# Patient Record
Sex: Female | Born: 1970 | Race: White | Hispanic: No | Smoking: Former smoker
Health system: Southern US, Community
[De-identification: ages and names within clinical notes are randomized; demographics above are authoritative.]

## PROBLEM LIST (undated history)

## (undated) DIAGNOSIS — K219 Gastro-esophageal reflux disease without esophagitis: Secondary | ICD-10-CM

## (undated) DIAGNOSIS — R079 Chest pain, unspecified: Secondary | ICD-10-CM

## (undated) DIAGNOSIS — I10 Essential (primary) hypertension: Secondary | ICD-10-CM

## (undated) HISTORY — DX: Essential (primary) hypertension: I10

## (undated) HISTORY — DX: Chest pain, unspecified: R07.9

## (undated) HISTORY — DX: Gastro-esophageal reflux disease without esophagitis: K21.9

---

## 2011-01-02 ENCOUNTER — Emergency Department (HOSPITAL_COMMUNITY): Payer: Medicaid Other

## 2011-01-02 ENCOUNTER — Emergency Department (HOSPITAL_COMMUNITY)
Admission: EM | Admit: 2011-01-02 | Discharge: 2011-01-02 | Disposition: A | Discharge status: Home / Self Care | Payer: Medicaid Other | Attending: Emergency Medicine | Admitting: Emergency Medicine

## 2011-01-02 DIAGNOSIS — K219 Gastro-esophageal reflux disease without esophagitis: Secondary | ICD-10-CM | POA: Insufficient documentation

## 2011-01-02 DIAGNOSIS — Z79899 Other long term (current) drug therapy: Secondary | ICD-10-CM | POA: Insufficient documentation

## 2011-01-02 DIAGNOSIS — R05 Cough: Secondary | ICD-10-CM | POA: Insufficient documentation

## 2011-01-02 DIAGNOSIS — R059 Cough, unspecified: Secondary | ICD-10-CM | POA: Insufficient documentation

## 2011-01-02 DIAGNOSIS — E119 Type 2 diabetes mellitus without complications: Secondary | ICD-10-CM | POA: Insufficient documentation

## 2011-01-02 DIAGNOSIS — J029 Acute pharyngitis, unspecified: Secondary | ICD-10-CM | POA: Insufficient documentation

## 2011-01-02 DIAGNOSIS — J40 Bronchitis, not specified as acute or chronic: Secondary | ICD-10-CM | POA: Insufficient documentation

## 2011-01-02 LAB — RAPID STREP SCREEN (MED CTR MEBANE ONLY): Streptococcus, Group A Screen (Direct): NEGATIVE

## 2011-05-13 ENCOUNTER — Emergency Department (HOSPITAL_COMMUNITY)
Admission: EM | Admit: 2011-05-13 | Discharge: 2011-05-13 | Disposition: A | Discharge status: Home / Self Care | Payer: Medicaid Other | Attending: Emergency Medicine | Admitting: Emergency Medicine

## 2011-05-13 ENCOUNTER — Encounter: Payer: Self-pay | Admitting: Emergency Medicine

## 2011-05-13 ENCOUNTER — Emergency Department (HOSPITAL_COMMUNITY): Payer: Medicaid Other

## 2011-05-13 DIAGNOSIS — Y92009 Unspecified place in unspecified non-institutional (private) residence as the place of occurrence of the external cause: Secondary | ICD-10-CM | POA: Insufficient documentation

## 2011-05-13 DIAGNOSIS — S82409A Unspecified fracture of shaft of unspecified fibula, initial encounter for closed fracture: Secondary | ICD-10-CM | POA: Insufficient documentation

## 2011-05-13 DIAGNOSIS — F17200 Nicotine dependence, unspecified, uncomplicated: Secondary | ICD-10-CM | POA: Insufficient documentation

## 2011-05-13 DIAGNOSIS — E119 Type 2 diabetes mellitus without complications: Secondary | ICD-10-CM | POA: Insufficient documentation

## 2011-05-13 MED ORDER — OXYCODONE-ACETAMINOPHEN 5-325 MG PO TABS
1.0000 | ORAL_TABLET | Freq: Once | ORAL | Status: AC
  Administered 2011-05-13: 1 via ORAL
  Filled 2011-05-13: qty 1

## 2011-05-13 MED ORDER — KETOROLAC TROMETHAMINE 60 MG/2ML IM SOLN
60.0000 mg | Freq: Once | INTRAMUSCULAR | Status: AC
  Administered 2011-05-13: 60 mg via INTRAMUSCULAR
  Filled 2011-05-13: qty 2

## 2011-05-13 MED ORDER — OXYCODONE-ACETAMINOPHEN 5-325 MG PO TABS: 1.0000 | ORAL_TABLET | ORAL | Status: AC | PRN
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
## 2011-05-16 NOTE — ED Provider Notes (Signed)
Medical screening examination/treatment/procedure(s) were performed by non-physician practitioner and as supervising physician I was immediately available for consultation/collaboration.   Daianna Vasques L Jakera Beaupre, MD 05/16/11 0735 

## 2011-05-19 ENCOUNTER — Ambulatory Visit: Payer: Medicaid Other | Admitting: Orthopedic Surgery

## 2011-05-20 ENCOUNTER — Ambulatory Visit (INDEPENDENT_AMBULATORY_CARE_PROVIDER_SITE_OTHER): Payer: Medicaid Other | Admitting: Orthopedic Surgery

## 2011-05-20 ENCOUNTER — Encounter: Payer: Self-pay | Admitting: Orthopedic Surgery

## 2011-05-20 VITALS — BP 110/80 | Ht 62.0 in | Wt 195.0 lb

## 2011-05-20 DIAGNOSIS — S8263XA Displaced fracture of lateral malleolus of unspecified fibula, initial encounter for closed fracture: Secondary | ICD-10-CM | POA: Insufficient documentation

## 2011-05-20 MED ORDER — HYDROCODONE-ACETAMINOPHEN 7.5-325 MG PO TABS: 1.0000 | ORAL_TABLET | ORAL | Status: AC | PRN

## 2011-05-20 NOTE — Patient Instructions (Signed)
Walk on it as tolerated with boot and crutches

## 2011-05-21 ENCOUNTER — Ambulatory Visit: Payer: Medicaid Other | Admitting: Orthopedic Surgery

## 2011-05-26 ENCOUNTER — Encounter: Payer: Self-pay | Admitting: Orthopedic Surgery
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
## 2011-06-24 ENCOUNTER — Ambulatory Visit (INDEPENDENT_AMBULATORY_CARE_PROVIDER_SITE_OTHER): Payer: Medicaid Other | Admitting: Orthopedic Surgery

## 2011-06-24 ENCOUNTER — Ambulatory Visit: Payer: Medicaid Other | Admitting: Orthopedic Surgery

## 2011-06-24 ENCOUNTER — Encounter: Payer: Self-pay | Admitting: Orthopedic Surgery

## 2011-06-24 VITALS — BP 104/60 | Ht 62.0 in | Wt 195.0 lb

## 2011-06-24 DIAGNOSIS — S82899A Other fracture of unspecified lower leg, initial encounter for closed fracture: Secondary | ICD-10-CM

## 2011-06-24 MED ORDER — HYDROCODONE-ACETAMINOPHEN 7.5-325 MG PO TABS: 1.0000 | ORAL_TABLET | ORAL | Status: DC | PRN

## 2011-06-24 NOTE — Patient Instructions (Signed)
Do Exercises   Wear Boot   Take medication

## 2011-06-25 ENCOUNTER — Encounter: Payer: Self-pay | Admitting: Orthopedic Surgery
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
## 2011-08-05 ENCOUNTER — Ambulatory Visit: Payer: Medicaid Other | Admitting: Orthopedic Surgery

## 2011-08-07 ENCOUNTER — Ambulatory Visit: Payer: Medicaid Other | Admitting: Orthopedic Surgery

## 2011-08-18 ENCOUNTER — Ambulatory Visit (INDEPENDENT_AMBULATORY_CARE_PROVIDER_SITE_OTHER): Payer: Medicaid Other | Admitting: Orthopedic Surgery

## 2011-08-18 ENCOUNTER — Encounter: Payer: Self-pay | Admitting: Orthopedic Surgery

## 2011-08-18 ENCOUNTER — Ambulatory Visit: Payer: Medicaid Other | Admitting: Orthopedic Surgery

## 2011-08-18 VITALS — BP 130/80 | Ht 62.0 in | Wt 211.0 lb

## 2011-08-18 DIAGNOSIS — S82899A Other fracture of unspecified lower leg, initial encounter for closed fracture: Secondary | ICD-10-CM | POA: Insufficient documentation

## 2011-08-18 MED ORDER — HYDROCODONE-ACETAMINOPHEN 7.5-325 MG PO TABS: 1.0000 | ORAL_TABLET | ORAL | Status: DC | PRN

## 2011-08-18 MED ORDER — HYDROCODONE-ACETAMINOPHEN 7.5-325 MG PO TABS: 1.0000 | ORAL_TABLET | ORAL | Status: AC | PRN

## 2011-08-18 NOTE — Progress Notes (Signed)
Patient ID: Kayla Wilkinson, female   DOB: 04-27-1971, 41 y.o.   MRN: [id number]  This is a fracture care followup visit  DOI:   Diagnoses A lateral malleolus fracture LEFT ankle  Treatment Immobilization was Cam Walker  Current medications Hydrocodone 7.5 mg  Today we will be taking x-rays.  Complains of pain along the fibula.  BP 130/80  Ht 5\' 2"  (1.575 m)  Wt 211 lb (95.709 kg)  BMI 38.59 kg/m2   Clinical exam shows no tenderness some restriction in her range of motion at the ankle joint.  I did show her how to do some more exercises using large circles frontward and backwards continued ankle flexion-extension exercises  X-ray show fracture healing  We returned to work  Pain medicine were prescribed and refilled followup as needed  X-ray report Fracture care followup 3 views LEFT ankle  Fracture is only seen on the lateral view it is nondisplaced overall it has improved since initial injury.  Ankle mortise intact.  Impression healed LEFT ankle fracture

## 2011-08-18 NOTE — Patient Instructions (Addendum)
Work note:  may return to employment   May remove brace

## 2011-08-21 ENCOUNTER — Emergency Department (HOSPITAL_COMMUNITY)
Admission: EM | Admit: 2011-08-21 | Discharge: 2011-08-21 | Disposition: A | Discharge status: Home / Self Care | Payer: Medicaid Other | Attending: Emergency Medicine | Admitting: Emergency Medicine

## 2011-08-21 ENCOUNTER — Encounter (HOSPITAL_COMMUNITY): Payer: Self-pay | Admitting: *Deleted

## 2011-08-21 DIAGNOSIS — E119 Type 2 diabetes mellitus without complications: Secondary | ICD-10-CM | POA: Insufficient documentation

## 2011-08-21 DIAGNOSIS — H109 Unspecified conjunctivitis: Secondary | ICD-10-CM

## 2011-08-21 DIAGNOSIS — H5710 Ocular pain, unspecified eye: Secondary | ICD-10-CM | POA: Insufficient documentation

## 2011-08-21 MED ORDER — HYDROCODONE-ACETAMINOPHEN 5-325 MG PO TABS
2.0000 | ORAL_TABLET | Freq: Once | ORAL | Status: DC
  Filled 2011-08-21: qty 2

## 2011-08-21 MED ORDER — ONDANSETRON 4 MG PO TBDP
4.0000 mg | ORAL_TABLET | Freq: Once | ORAL | Status: DC
  Filled 2011-08-21: qty 1

## 2011-08-21 MED ORDER — HYDROCODONE-ACETAMINOPHEN 5-325 MG PO TABS: ORAL_TABLET | ORAL | Status: DC

## 2011-08-21 MED ORDER — TOBRAMYCIN 0.3 % OP SOLN
2.0000 [drp] | OPHTHALMIC | Status: DC
  Filled 2011-08-21: qty 5

## 2011-08-21 NOTE — Discharge Instructions (Signed)
Please wash hands frequently. Cool compress to the left eye 3 to 4 times daily. Tobramycin to the left eye every 4 hours. Norco for pain if needed. This may cause drowsiness, use with caution.

## 2011-08-21 NOTE — ED Notes (Signed)
Pain lt eye with redness, and d/c

## 2011-08-21 NOTE — ED Notes (Signed)
Pt DC to home in the care of family 

## 2011-08-21 NOTE — ED Notes (Signed)
MD at bedside. 

## 2011-08-21 NOTE — ED Provider Notes (Signed)
History     CSN: [id number]  Arrival date & time 08/21/11  2021   First MD Initiated Contact with Patient 08/21/11 2117      Chief Complaint  Patient presents with  . Eye Pain    (Consider location/radiation/quality/duration/timing/severity/associated sxs/prior treatment) HPI Comments: The patient states she was awakened this morning to find she had exudate on the upper and lower lids of the left eye. She later noted increased redness of the eyes during the day. This was accompanied by problems with pain when she changed various intensities of light. Upon arrival to the emergency department she was also dealing with a headache. She presents now for evaluation of her eye and for assistance with her headache. She denies any recent injury she denies being around any welding or grinding. She does say she's been having a mild to moderate scratchy throat and congestion.  Patient is a 41 y.o. female presenting with eye pain. The history is provided by the patient.  Eye Pain This is a new problem. The current episode started today. The problem occurs constantly. The problem has been unchanged. Associated symptoms include a sore throat. Pertinent negatives include no abdominal pain, arthralgias, chest pain, coughing or neck pain. Exacerbated by: light changes. She has tried nothing for the symptoms. The treatment provided no relief.    Past Medical History  Diagnosis Date  . Diabetes mellitus     History reviewed. No pertinent past surgical history.  Family History  Problem Relation Age of Onset  . Diabetes Mother     History  Substance Use Topics  . Smoking status: Former Smoker -- 0.2 packs/day for 1.5 years    Types: Cigarettes  . Smokeless tobacco: Never Used  . Alcohol Use: Yes     occassional    OB History    Grav Para Term Preterm Abortions TAB SAB Ect Mult Living   3 2 2  1  1          Review of Systems  Constitutional: Negative for activity change.       All ROS Neg  except as noted in HPI  HENT: Positive for sore throat. Negative for nosebleeds and neck pain.   Eyes: Positive for pain. Negative for photophobia and discharge.  Respiratory: Negative for cough, shortness of breath and wheezing.   Cardiovascular: Negative for chest pain and palpitations.  Gastrointestinal: Negative for abdominal pain and blood in stool.  Genitourinary: Negative for dysuria, frequency and hematuria.  Musculoskeletal: Negative for back pain and arthralgias.  Skin: Negative.   Neurological: Negative for dizziness, seizures and speech difficulty.  Psychiatric/Behavioral: Negative for hallucinations and confusion.    Allergies  Review of patient's allergies indicates no known allergies.  Home Medications   Current Outpatient Rx  Name Route Sig Dispense Refill  . METFORMIN HCL 500 MG PO TABS Oral Take 500 mg by mouth 2 (two) times daily.    Marland Kitchen RANITIDINE HCL 300 MG PO CAPS Oral Take 300 mg by mouth 2 (two) times daily.      Marland Kitchen HYDROCODONE-ACETAMINOPHEN 5-325 MG PO TABS  1 or 2 po q4h prn pain 15 tablet 0  . HYDROCODONE-ACETAMINOPHEN 7.5-325 MG PO TABS Oral Take 1 tablet by mouth every 4 (four) hours as needed for pain. 60 tablet 5    BP 126/71  Pulse 115  Temp(Src) 97.8 F (36.6 C) (Oral)  Resp 20  Ht 5\' 2"  (1.575 m)  Wt 211 lb (95.709 kg)  BMI 38.59 kg/m2  SpO2 99%  LMP 08/14/2011  Physical Exam  Nursing note and vitals reviewed. Constitutional: She is oriented to person, place, and time. She appears well-developed and well-nourished.  Non-toxic appearance.  HENT:  Head: Normocephalic.  Right Ear: Tympanic membrane and external ear normal.  Left Ear: Tympanic membrane and external ear normal.  Eyes: EOM and lids are normal. Pupils are equal, round, and reactive to light.       The left conjunctiva is injected. The extraocular movement is intact. The anterior chambers clear. The lids were inverted and no foreign body appreciated.  Neck: Normal range of motion.  Neck supple. Carotid bruit is not present.  Cardiovascular: Normal rate, regular rhythm, normal heart sounds, intact distal pulses and normal pulses.   Pulmonary/Chest: Breath sounds normal. No respiratory distress.  Abdominal: Soft. Bowel sounds are normal. There is no tenderness. There is no guarding.  Musculoskeletal: Normal range of motion.  Lymphadenopathy:       Head (right side): No submandibular adenopathy present.       Head (left side): No submandibular adenopathy present.    She has no cervical adenopathy.  Neurological: She is alert and oriented to person, place, and time. She has normal strength. No cranial nerve deficit or sensory deficit.  Skin: Skin is warm and dry.  Psychiatric: She has a normal mood and affect. Her speech is normal.    ED Course  Procedures (including critical care time)  Labs Reviewed - No data to display No results found.   1. Conjunctivitis       MDM  I have reviewed nursing notes, vital signs, and all appropriate lab and imaging results for this patient.  Patient is to use tobramycin eyedrops every 4 hours to the left eye. Wash hands frequently. She is to return to the emergency department or see the ophthalmologist for additional evaluation if not improving. Prescription for Norco 5 mg one or 2 tablets every 4 hours #15 given to the patient.      Kathie Dike, [place] 08/22/11 [phone]

## 2011-08-23 NOTE — ED Provider Notes (Signed)
Medical screening examination/treatment/procedure(s) were performed by non-physician practitioner and as supervising physician I was immediately available for consultation/collaboration.  Nicoletta Dress. Colon Branch, MD 08/23/11 [phone]

## 2011-09-02 ENCOUNTER — Telehealth: Payer: Self-pay | Admitting: Orthopedic Surgery

## 2011-09-02 NOTE — Telephone Encounter (Signed)
[facility] also just left a message asking for you to call Diane at the pharmacy about this same patient Her # [phone]

## 2011-09-02 NOTE — Telephone Encounter (Signed)
PATIENT CALLED BACK TO RELAY THAT NO IS CALL NEEDED, SCRIPTS HAVE BEEN FILLED

## 2011-09-02 NOTE — Telephone Encounter (Signed)
Patient asked for the nurse to call her, says she is at [facility] in [place], and there is a mix-up about her getting a refill on her Hydrocodone.  Her # [phone]

## 2013-06-07 ENCOUNTER — Encounter: Payer: Self-pay | Admitting: Orthopedic Surgery

## 2013-06-07 ENCOUNTER — Ambulatory Visit (INDEPENDENT_AMBULATORY_CARE_PROVIDER_SITE_OTHER): Payer: 59 | Admitting: Orthopedic Surgery

## 2013-06-07 VITALS — BP 122/90 | Ht 62.5 in | Wt 202.0 lb

## 2013-06-07 DIAGNOSIS — M67919 Unspecified disorder of synovium and tendon, unspecified shoulder: Secondary | ICD-10-CM

## 2013-06-07 DIAGNOSIS — M7550 Bursitis of unspecified shoulder: Secondary | ICD-10-CM | POA: Insufficient documentation

## 2013-06-07 DIAGNOSIS — M7552 Bursitis of left shoulder: Secondary | ICD-10-CM

## 2013-06-07 MED ORDER — HYDROCODONE-ACETAMINOPHEN 5-325 MG PO TABS: 1.0000 | ORAL_TABLET | Freq: Four times a day (QID) | ORAL | Status: DC | PRN

## 2013-06-07 NOTE — Patient Instructions (Signed)
Continue Aleve and pain medicine as needed

## 2013-06-07 NOTE — Progress Notes (Signed)
Subacromial Shoulder Injection Procedure Note  Pre-operative Diagnosis: left RC Syndrome  Post-operative Diagnosis: same  Indications: pain   Anesthesia: ethyl chloride   Procedure Details   Verbal consent was obtained for the procedure. The shoulder was prepped withalcohol and the skin was anesthetized. A 20 gauge needle was advanced into the subacromial space through posterior approach without difficulty  The space was then injected with 3 ml 1% lidocaine and 1 ml of depomedrol. The injection site was cleansed with isopropyl alcohol and a dressing was applied.  Complications:  None; patient tolerated the procedure well.
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
## 2013-06-14 ENCOUNTER — Ambulatory Visit (INDEPENDENT_AMBULATORY_CARE_PROVIDER_SITE_OTHER): Payer: 59 | Admitting: Orthopedic Surgery

## 2013-06-14 ENCOUNTER — Encounter: Payer: Self-pay | Admitting: Orthopedic Surgery

## 2013-06-14 VITALS — BP 158/96 | Ht 62.5 in | Wt 202.0 lb

## 2013-06-14 DIAGNOSIS — M7552 Bursitis of left shoulder: Secondary | ICD-10-CM

## 2013-06-14 DIAGNOSIS — M67919 Unspecified disorder of synovium and tendon, unspecified shoulder: Secondary | ICD-10-CM

## 2013-06-14 MED ORDER — HYDROCODONE-ACETAMINOPHEN 5-325 MG PO TABS: 1.0000 | ORAL_TABLET | Freq: Four times a day (QID) | ORAL | Status: DC | PRN
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
## 2013-06-14 NOTE — Patient Instructions (Addendum)
Call to arrange therapy at [facility]  Impingement Syndrome, Rotator Cuff, Bursitis with Rehab Impingement syndrome is a condition that involves inflammation of the tendons of the rotator cuff and the subacromial bursa, that causes pain in the shoulder. The rotator cuff consists of four tendons and muscles that control much of the shoulder and upper arm function. The subacromial bursa is a fluid filled sac that helps reduce friction between the rotator cuff and one of the bones of the shoulder (acromion). Impingement syndrome is usually an overuse injury that causes swelling of the bursa (bursitis), swelling of the tendon (tendonitis), and/or a tear of the tendon (strain). Strains are classified into three categories. Grade 1 strains cause pain, but the tendon is not lengthened. Grade 2 strains include a lengthened ligament, due to the ligament being stretched or partially ruptured. With grade 2 strains there is still function, although the function may be decreased. Grade 3 strains include a complete tear of the tendon or muscle, and function is usually impaired. SYMPTOMS   Pain around the shoulder, often at the outer portion of the upper arm.  Pain that gets worse with shoulder function, especially when reaching overhead or lifting.  Sometimes, aching when not using the arm.  Pain that wakes you up at night.  Sometimes, tenderness, swelling, warmth, or redness over the affected area.  Loss of strength.  Limited motion of the shoulder, especially reaching behind the back (to the back pocket or to unhook bra) or across your body.  Crackling sound (crepitation) when moving the arm.  Biceps tendon pain and inflammation (in the front of the shoulder). Worse when bending the elbow or lifting. CAUSES  Impingement syndrome is often an overuse injury, in which chronic (repetitive) motions cause the tendons or bursa to become inflamed. A strain occurs when a force is paced on the tendon or muscle that is  greater than it can withstand. Common mechanisms of injury include: Stress from sudden increase in duration, frequency, or intensity of training.  Direct hit (trauma) to the shoulder.  Aging, erosion of the tendon with normal use.  Bony bump on shoulder (acromial spur). RISK INCREASES WITH:  Contact sports (football, wrestling, boxing).  Throwing sports (baseball, tennis, volleyball).  Weightlifting and bodybuilding.  Heavy labor.  Previous injury to the rotator cuff, including impingement.  Poor shoulder strength and flexibility.  Failure to warm up properly before activity.  Inadequate protective equipment.  Old age.  Bony bump on shoulder (acromial spur). PREVENTION   Warm up and stretch properly before activity.  Allow for adequate recovery between workouts.  Maintain physical fitness:  Strength, flexibility, and endurance.  Cardiovascular fitness.  Learn and use proper exercise technique. PROGNOSIS  If treated properly, impingement syndrome usually goes away within 6 weeks. Sometimes surgery is required.  RELATED COMPLICATIONS   Longer healing time if not properly treated, or if not given enough time to heal.  Recurring symptoms, that result in a chronic condition.  Shoulder stiffness, frozen shoulder, or loss of motion.  Rotator cuff tendon tear.  Recurring symptoms, especially if activity is resumed too soon, with overuse, with a direct blow, or when using poor technique. TREATMENT  Treatment first involves the use of ice and medicine, to reduce pain and inflammation. The use of strengthening and stretching exercises may help reduce pain with activity. These exercises may be performed at home or with a therapist. If non-surgical treatment is unsuccessful after more than 6 months, surgery may be advised. After surgery and rehabilitation,  activity is usually possible in 3 months.  MEDICATION  If pain medicine is needed, nonsteroidal anti-inflammatory  medicines (aspirin and ibuprofen), or other minor pain relievers (acetaminophen), are often advised.  Do not take pain medicine for 7 days before surgery.  Prescription pain relievers may be given, if your caregiver thinks they are needed. Use only as directed and only as much as you need.  Corticosteroid injections may be given by your caregiver. These injections should be reserved for the most serious cases, because they may only be given a certain number of times. HEAT AND COLD  Cold treatment (icing) should be applied for 10 to 15 minutes every 2 to 3 hours for inflammation and pain, and immediately after activity that aggravates your symptoms. Use ice packs or an ice massage.  Heat treatment may be used before performing stretching and strengthening activities prescribed by your caregiver, physical therapist, or athletic trainer. Use a heat pack or a warm water soak. SEEK MEDICAL CARE IF:   Symptoms get worse or do not improve in 4 to 6 weeks, despite treatment.  New, unexplained symptoms develop. (Drugs used in treatment may produce side effects.) EXERCISES  RANGE OF MOTION (ROM) AND STRETCHING EXERCISES - Impingement Syndrome (Rotator Cuff  Tendinitis, Bursitis) These exercises may help you when beginning to rehabilitate your injury. Your symptoms may go away with or without further involvement from your physician, physical therapist or athletic trainer. While completing these exercises, remember:   Restoring tissue flexibility helps normal motion to return to the joints. This allows healthier, less painful movement and activity.  An effective stretch should be held for at least 30 seconds.  A stretch should never be painful. You should only feel a gentle lengthening or release in the stretched tissue. STRETCH  Flexion, Standing  Stand with good posture. With an underhand grip on your right / left hand, and an overhand grip on the opposite hand, grasp a broomstick or cane so that  your hands are a little more than shoulder width apart.  Keeping your right / left elbow straight and shoulder muscles relaxed, push the stick with your opposite hand, to raise your right / left arm in front of your body and then overhead. Raise your arm until you feel a stretch in your right / left shoulder, but before you have increased shoulder pain.  Try to avoid shrugging your right / left shoulder as your arm rises, by keeping your shoulder blade tucked down and toward your mid-back spine. Hold for __________ seconds.  Slowly return to the starting position. Repeat __________ times. Complete this exercise __________ times per day. STRETCH  Abduction, Supine  Lie on your back. With an underhand grip on your right / left hand and an overhand grip on the opposite hand, grasp a broomstick or cane so that your hands are a little more than shoulder width apart.  Keeping your right / left elbow straight and your shoulder muscles relaxed, push the stick with your opposite hand, to raise your right / left arm out to the side of your body and then overhead. Raise your arm until you feel a stretch in your right / left shoulder, but before you have increased shoulder pain.  Try to avoid shrugging your right / left shoulder as your arm rises, by keeping your shoulder blade tucked down and toward your mid-back spine. Hold for __________ seconds.  Slowly return to the starting position. Repeat __________ times. Complete this exercise __________ times per day. ROM  Flexion, Active-Assisted  Lie on your back. You may bend your knees for comfort.  Grasp a broomstick or cane so your hands are about shoulder width apart. Your right / left hand should grip the end of the stick, so that your hand is positioned "thumbs-up," as if you were about to shake hands.  Using your healthy arm to lead, raise your right / left arm overhead, until you feel a gentle stretch in your shoulder. Hold for __________  seconds.  Use the stick to assist in returning your right / left arm to its starting position. Repeat __________ times. Complete this exercise __________ times per day.  ROM - Internal Rotation, Supine   Lie on your back on a firm surface. Place your right / left elbow about 60 degrees away from your side. Elevate your elbow with a folded towel, so that the elbow and shoulder are the same height.  Using a broomstick or cane and your strong arm, pull your right / left hand toward your body until you feel a gentle stretch, but no increase in your shoulder pain. Keep your shoulder and elbow in place throughout the exercise.  Hold for __________ seconds. Slowly return to the starting position. Repeat __________ times. Complete this exercise __________ times per day. STRETCH - Internal Rotation  Place your right / left hand behind your back, palm up.  Throw a towel or belt over your opposite shoulder. Grasp the towel with your right / left hand.  While keeping an upright posture, gently pull up on the towel, until you feel a stretch in the front of your right / left shoulder.  Avoid shrugging your right / left shoulder as your arm rises, by keeping your shoulder blade tucked down and toward your mid-back spine.  Hold for __________ seconds. Release the stretch, by lowering your healthy hand. Repeat __________ times. Complete this exercise __________ times per day. ROM - Internal Rotation   Using an underhand grip, grasp a stick behind your back with both hands.  While standing upright with good posture, slide the stick up your back until you feel a mild stretch in the front of your shoulder.  Hold for __________ seconds. Slowly return to your starting position. Repeat __________ times. Complete this exercise __________ times per day.  STRETCH  Posterior Shoulder Capsule   Stand or sit with good posture. Grasp your right / left elbow and draw it across your chest, keeping it at the same  height as your shoulder.  Pull your elbow, so your upper arm comes in closer to your chest. Pull until you feel a gentle stretch in the back of your shoulder.  Hold for __________ seconds. Repeat __________ times. Complete this exercise __________ times per day. STRENGTHENING EXERCISES - Impingement Syndrome (Rotator Cuff Tendinitis, Bursitis) These exercises may help you when beginning to rehabilitate your injury. They may resolve your symptoms with or without further involvement from your physician, physical therapist or athletic trainer. While completing these exercises, remember:  Muscles can gain both the endurance and the strength needed for everyday activities through controlled exercises.  Complete these exercises as instructed by your physician, physical therapist or athletic trainer. Increase the resistance and repetitions only as guided.  You may experience muscle soreness or fatigue, but the pain or discomfort you are trying to eliminate should never worsen during these exercises. If this pain does get worse, stop and make sure you are following the directions exactly. If the pain is still present after adjustments,  discontinue the exercise until you can discuss the trouble with your clinician.  During your recovery, avoid activity or exercises which involve actions that place your injured hand or elbow above your head or behind your back or head. These positions stress the tissues which you are trying to heal. STRENGTH - Scapular Depression and Adduction   With good posture, sit on a firm chair. Support your arms in front of you, with pillows, arm rests, or on a table top. Have your elbows in line with the sides of your body.  Gently draw your shoulder blades down and toward your mid-back spine. Gradually increase the tension, without tensing the muscles along the top of your shoulders and the back of your neck.  Hold for __________ seconds. Slowly release the tension and relax  your muscles completely before starting the next repetition.  After you have practiced this exercise, remove the arm support and complete the exercise in standing as well as sitting position. Repeat __________ times. Complete this exercise __________ times per day.  STRENGTH - Shoulder Abductors, Isometric  With good posture, stand or sit about 4-6 inches from a wall, with your right / left side facing the wall.  Bend your right / left elbow. Gently press your right / left elbow into the wall. Increase the pressure gradually, until you are pressing as hard as you can, without shrugging your shoulder or increasing any shoulder discomfort.  Hold for __________ seconds.  Release the tension slowly. Relax your shoulder muscles completely before you begin the next repetition. Repeat __________ times. Complete this exercise __________ times per day.  STRENGTH - External Rotators, Isometric  Keep your right / left elbow at your side and bend it 90 degrees.  Step into a door frame so that the outside of your right / left wrist can press against the door frame without your upper arm leaving your side.  Gently press your right / left wrist into the door frame, as if you were trying to swing the back of your hand away from your stomach. Gradually increase the tension, until you are pressing as hard as you can, without shrugging your shoulder or increasing any shoulder discomfort.  Hold for __________ seconds.  Release the tension slowly. Relax your shoulder muscles completely before you begin the next repetition. Repeat __________ times. Complete this exercise __________ times per day.  STRENGTH - Supraspinatus   Stand or sit with good posture. Grasp a __________ weight, or an exercise band or tubing, so that your hand is "thumbs-up," like you are shaking hands.  Slowly lift your right / left arm in a "V" away from your thigh, diagonally into the space between your side and straight ahead. Lift  your hand to shoulder height or as far as you can, without increasing any shoulder pain. At first, many people do not lift their hands above shoulder height.  Avoid shrugging your right / left shoulder as your arm rises, by keeping your shoulder blade tucked down and toward your mid-back spine.  Hold for __________ seconds. Control the descent of your hand, as you slowly return to your starting position. Repeat __________ times. Complete this exercise __________ times per day.  STRENGTH - External Rotators  Secure a rubber exercise band or tubing to a fixed object (table, pole) so that it is at the same height as your right / left elbow when you are standing or sitting on a firm surface.  Stand or sit so that the secured exercise band  is at your uninjured side.  Bend your right / left elbow 90 degrees. Place a folded towel or small pillow under your right / left arm, so that your elbow is a few inches away from your side.  Keeping the tension on the exercise band, pull it away from your body, as if pivoting on your elbow. Be sure to keep your body steady, so that the movement is coming only from your rotating shoulder.  Hold for __________ seconds. Release the tension in a controlled manner, as you return to the starting position. Repeat __________ times. Complete this exercise __________ times per day.  STRENGTH - Internal Rotators   Secure a rubber exercise band or tubing to a fixed object (table, pole) so that it is at the same height as your right / left elbow when you are standing or sitting on a firm surface.  Stand or sit so that the secured exercise band is at your right / left side.  Bend your elbow 90 degrees. Place a folded towel or small pillow under your right / left arm so that your elbow is a few inches away from your side.  Keeping the tension on the exercise band, pull it across your body, toward your stomach. Be sure to keep your body steady, so that the movement is coming  only from your rotating shoulder.  Hold for __________ seconds. Release the tension in a controlled manner, as you return to the starting position. Repeat __________ times. Complete this exercise __________ times per day.  STRENGTH - Scapular Protractors, Standing   Stand arms length away from a wall. Place your hands on the wall, keeping your elbows straight.  Begin by dropping your shoulder blades down and toward your mid-back spine.  To strengthen your protractors, keep your shoulder blades down, but slide them forward on your rib cage. It will feel as if you are lifting the back of your rib cage away from the wall. This is a subtle motion and can be challenging to complete. Ask your caregiver for further instruction, if you are not sure you are doing the exercise correctly.  Hold for __________ seconds. Slowly return to the starting position, resting the muscles completely before starting the next repetition. Repeat __________ times. Complete this exercise __________ times per day. STRENGTH - Scapular Protractors, Supine  Lie on your back on a firm surface. Extend your right / left arm straight into the air while holding a __________ weight in your hand.  Keeping your head and back in place, lift your shoulder off the floor.  Hold for __________ seconds. Slowly return to the starting position, and allow your muscles to relax completely before starting the next repetition. Repeat __________ times. Complete this exercise __________ times per day. STRENGTH - Scapular Protractors, Quadruped  Get onto your hands and knees, with your shoulders directly over your hands (or as close as you can be, comfortably).  Keeping your elbows locked, lift the back of your rib cage up into your shoulder blades, so your mid-back rounds out. Keep your neck muscles relaxed.  Hold this position for __________ seconds. Slowly return to the starting position and allow your muscles to relax completely before  starting the next repetition. Repeat __________ times. Complete this exercise __________ times per day.  STRENGTH - Scapular Retractors  Secure a rubber exercise band or tubing to a fixed object (table, pole), so that it is at the height of your shoulders when you are either standing, or sitting on  a firm armless chair.  With a palm down grip, grasp an end of the band in each hand. Straighten your elbows and lift your hands straight in front of you, at shoulder height. Step back, away from the secured end of the band, until it becomes tense.  Squeezing your shoulder blades together, draw your elbows back toward your sides, as you bend them. Keep your upper arms lifted away from your body throughout the exercise.  Hold for __________ seconds. Slowly ease the tension on the band, as you reverse the directions and return to the starting position. Repeat __________ times. Complete this exercise __________ times per day. STRENGTH - Shoulder Extensors   Secure a rubber exercise band or tubing to a fixed object (table, pole) so that it is at the height of your shoulders when you are either standing, or sitting on a firm armless chair.  With a thumbs-up grip, grasp an end of the band in each hand. Straighten your elbows and lift your hands straight in front of you, at shoulder height. Step back, away from the secured end of the band, until it becomes tense.  Squeezing your shoulder blades together, pull your hands down to the sides of your thighs. Do not allow your hands to go behind you.  Hold for __________ seconds. Slowly ease the tension on the band, as you reverse the directions and return to the starting position. Repeat __________ times. Complete this exercise __________ times per day.  STRENGTH - Scapular Retractors and External Rotators   Secure a rubber exercise band or tubing to a fixed object (table, pole) so that it is at the height as your shoulders, when you are either standing, or  sitting on a firm armless chair.  With a palm down grip, grasp an end of the band in each hand. Bend your elbows 90 degrees and lift your elbows to shoulder height, at your sides. Step back, away from the secured end of the band, until it becomes tense.  Squeezing your shoulder blades together, rotate your shoulders so that your upper arms and elbows remain stationary, but your fists travel upward to head height.  Hold for __________ seconds. Slowly ease the tension on the band, as you reverse the directions and return to the starting position. Repeat __________ times. Complete this exercise __________ times per day.  STRENGTH - Scapular Retractors and External Rotators, Rowing   Secure a rubber exercise band or tubing to a fixed object (table, pole) so that it is at the height of your shoulders, when you are either standing, or sitting on a firm armless chair.  With a palm down grip, grasp an end of the band in each hand. Straighten your elbows and lift your hands straight in front of you, at shoulder height. Step back, away from the secured end of the band, until it becomes tense.  Step 1: Squeeze your shoulder blades together. Bending your elbows, draw your hands to your chest, as if you are rowing a boat. At the end of this motion, your hands and elbow should be at shoulder height and your elbows should be out to your sides.  Step 2: Rotate your shoulders, to raise your hands above your head. Your forearms should be vertical and your upper arms should be horizontal.  Hold for __________ seconds. Slowly ease the tension on the band, as you reverse the directions and return to the starting position. Repeat __________ times. Complete this exercise __________ times per day.  STRENGTH  Scapular Depressors  Find a sturdy chair without wheels, such as a dining room chair.  Keeping your feet on the floor, and your hands on the chair arms, lift your bottom up from the seat, and lock your  elbows.  Keeping your elbows straight, allow gravity to pull your body weight down. Your shoulders will rise toward your ears.  Raise your body against gravity by drawing your shoulder blades down your back, shortening the distance between your shoulders and ears. Although your feet should always maintain contact with the floor, your feet should progressively support less body weight, as you get stronger.  Hold for __________ seconds. In a controlled and slow manner, lower your body weight to begin the next repetition. Repeat __________ times. Complete this exercise __________ times per day.  Document Released: 06/23/2005 Document Revised: 09/15/2011 Document Reviewed: 10/05/2008 [facility] Patient Information 2014 [place], [place].

## 2013-06-16 ENCOUNTER — Ambulatory Visit (HOSPITAL_COMMUNITY)
Admission: RE | Admit: 2013-06-16 | Discharge: 2013-06-16 | Disposition: A | Discharge status: Home / Self Care | Payer: 59 | Source: Ambulatory Visit | Attending: Orthopedic Surgery | Admitting: Orthopedic Surgery

## 2013-06-16 DIAGNOSIS — M7552 Bursitis of left shoulder: Secondary | ICD-10-CM

## 2013-06-16 DIAGNOSIS — M25512 Pain in left shoulder: Secondary | ICD-10-CM

## 2013-06-16 DIAGNOSIS — M25619 Stiffness of unspecified shoulder, not elsewhere classified: Secondary | ICD-10-CM | POA: Insufficient documentation

## 2013-06-16 DIAGNOSIS — M6281 Muscle weakness (generalized): Secondary | ICD-10-CM | POA: Insufficient documentation

## 2013-06-16 DIAGNOSIS — IMO0001 Reserved for inherently not codable concepts without codable children: Secondary | ICD-10-CM | POA: Insufficient documentation

## 2013-06-16 DIAGNOSIS — M25519 Pain in unspecified shoulder: Secondary | ICD-10-CM | POA: Insufficient documentation
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
## 2013-06-17 ENCOUNTER — Ambulatory Visit (HOSPITAL_COMMUNITY)
Admission: RE | Admit: 2013-06-17 | Discharge: 2013-06-17 | Disposition: A | Discharge status: Home / Self Care | Payer: 59 | Source: Ambulatory Visit | Attending: Orthopedic Surgery | Admitting: Orthopedic Surgery

## 2013-06-17 NOTE — Progress Notes (Signed)
Occupational Therapy Treatment Patient Details  Name: ALEJA YEARWOOD MRN: [id number] Date of Birth: 11-21-1970  Today's Date: 06/17/2013 Time: 4098-1191 OT Time Calculation (min): 57 min Manual 1018-1043 (25') TherExercises 1043-1100 (17') Electrical Stimulation 1100-1115 (15')  Visit#: 2 of 18  Re-eval: 07/14/13    Subjective Symptoms/Limitations Symptoms: S: I still say it is some of the worst pain I have ever had in my life Pain Assessment Currently in Pain?: Yes Pain Score: 9  Pain Location: Shoulder Pain Orientation: Left Pain Type: Acute pain Multiple Pain Sites: No      Exercise/Treatments Supine Protraction: PROM;AAROM;10 reps External Rotation: PROM;AAROM;10 reps Internal Rotation: PROM;AAROM;10 reps Flexion: PROM;AAROM;10 reps ABduction: PROM;AAROM;10 reps Seated Elevation: AROM;10 reps Extension: AROM;10 reps Row: AROM;10 reps Therapy Ball Flexion: 10 reps ABduction: 10 reps ROM / Strengthening / Isometric Strengthening   Flexion: Supine;3X3" Extension: Supine;3X3" ABduction: Supine;3X3" ADduction: Supine;3X3"     Modalities Modalities: Electrical Stimulation Manual Therapy Manual Therapy: Myofascial release Myofascial Release: MFR and manual stretching to decrease pain and fascial restrictions and improve pain free mobility in right scapular, shoulder, trapezius, and upper arm region Electrical Stimulation Electrical Stimulation Location: IFES to left shoulder with heat. Electrical Stimulation Action: pain control  Electrical Stimulation Parameters: sweeping 10.5 intensity for 15' Electrical Stimulation Goals: Pain Weight Bearing Technique Weight Bearing Technique: No  Occupational Therapy Assessment and Plan OT Assessment and Plan Clinical Impression Statement: A:  Patient educated on posture/positioning this date of shoulder for exercises, sitting and ambulating to decrease guarding posture.  Patient noted to complete therapy ball  exercises in slow deliberate movements with min complaint of pain.  She also is noted to require max cues to relax during MFR, manual and stretching this date.  good tolerance of estim treatment this date wtih no sign/symptoms of difficulty following treatment.  OT Plan: P:  AAROM stretching in supine, positioning education, seated range of motion; IFES with heat for pain management.    Goals Short Term Goals Short Term Goal 1: Patient will be educated on a HEP. Short Term Goal 1 Progress: Progressing toward goal Short Term Goal 2: Patient will improve PROM to [facility] for increased independence with B/IADLs. Short Term Goal 2 Progress: Progressing toward goal Short Term Goal 3: Patient will improve left shoulder strength to 4/5 for increased ability to lift casserole dishes.  Short Term Goal 3 Progress: Progressing toward goal Short Term Goal 4: Patient will decrease pain to 6/10 in her left shoulder for increased ability to rest at night. Short Term Goal 4 Progress: Progressing toward goal Short Term Goal 5: Patient will decrease fascial restrictions to moderate in her left shoulder region.  Short Term Goal 5 Progress: Progressing toward goal Long Term Goals Long Term Goal 1: Patient will return to prior level of independence with all B/IADLS, work, and leisure activities.  Long Term Goal 1 Progress: Progressing toward goal Long Term Goal 2: Patient will improve left shoulder AROM to WNL for increased ability to reach overhead and pull seatbelt. Long Term Goal 2 Progress: Progressing toward goal Long Term Goal 3: Patient will improve left shoulder strength to 4+/63for increased ability to lift bags of groceries.  Long Term Goal 3 Progress: Progressing toward goal Long Term Goal 4: Patient will decrease pain in her left shoulder to 3/10 or better when sleeping at night.  Long Term Goal 4 Progress: Progressing toward goal Long Term Goal 5: Patient will have minimal or less fascial restrictions in  her left shoulder region. Long Term  Goal 5 Progress: Progressing toward goal  Problem List Patient Active Problem List   Diagnosis Date Noted  . Pain in joint, shoulder region 06/16/2013  . Muscle weakness (generalized) 06/16/2013  . Bursitis of shoulder region 06/07/2013  . Ankle fracture 08/18/2011  . Ankle fracture, lateral malleolus, closed 05/20/2011    End of Session Activity Tolerance: Patient tolerated treatment well General Behavior During Therapy: [facility] for tasks assessed/performed  GO    Velora Mediate, OTR/L  06/17/2013, 12:33 PM

## 2013-06-20 ENCOUNTER — Ambulatory Visit (HOSPITAL_COMMUNITY)
Admission: RE | Admit: 2013-06-20 | Discharge: 2013-06-20 | Disposition: A | Discharge status: Home / Self Care | Payer: 59 | Source: Ambulatory Visit | Attending: Family Medicine | Admitting: Family Medicine

## 2013-06-20 DIAGNOSIS — M6281 Muscle weakness (generalized): Secondary | ICD-10-CM

## 2013-06-20 DIAGNOSIS — M25512 Pain in left shoulder: Secondary | ICD-10-CM

## 2013-06-20 NOTE — Progress Notes (Signed)
Occupational Therapy Treatment Patient Details  Name: Kayla Wilkinson MRN: [id number] Date of Birth: 29-Jan-1971  Today's Date: 06/20/2013 Time: 4098-1191 OT Time Calculation (min): 59 min Manual therapy 942-957 15' Therapeutic exercises [phone] 23' IFES with heat 1026-1041 15'  Visit#: 3 of 18  Re-eval: 07/14/13    Subjective S:  Its still a very high level of pain.  Pain Assessment Currently in Pain?: Yes Pain Score: 9  Pain Location: Shoulder Pain Orientation: Left Pain Type: Acute pain  Precautions/Restrictions   progress as tolerated  Exercise/Treatments Supine Protraction: PROM;AAROM;10 reps Horizontal ABduction: PROM;AAROM;10 reps External Rotation: PROM;AAROM;10 reps Internal Rotation: PROM;AAROM;10 reps Flexion: PROM;AAROM;10 reps ABduction: PROM;AAROM;10 reps Seated Elevation: AROM;10 reps Extension: AROM;10 reps Row: AROM;10 reps Therapy Ball Flexion: 20 reps ABduction: 20 reps    Modalities Modalities: Electrical Stimulation Manual Therapy Manual Therapy: Myofascial release Myofascial Release: MFR and manual stretching to decrease pain and fascial restrictions and improve pain free mobility in right scapular, shoulder, trapezius, and upper arm region Electrical Stimulation Electrical Stimulation Location: IFES to left shoulder with heat. Electrical Stimulation Action: pain control Electrical Stimulation Parameters: sweeping 10.5 intensity for 15' Electrical Stimulation Goals: Pain  Occupational Therapy Assessment and Plan OT Assessment and Plan Clinical Impression Statement: A:  Patient began AAROM this date in supine for increased range with less pain.  With mod vg patient able to relax scapular elevators and complete range.   OT Plan: P:  attempt scapular stability with theraband.    Goals Short Term Goals Short Term Goal 1: Patient will be educated on a HEP. Short Term Goal 1 Progress: Progressing toward goal Short Term Goal 2: Patient will  improve PROM to [facility] for increased independence with B/IADLs. Short Term Goal 2 Progress: Progressing toward goal Short Term Goal 3: Patient will improve left shoulder strength to 4/5 for increased ability to lift casserole dishes.  Short Term Goal 3 Progress: Progressing toward goal Short Term Goal 4: Patient will decrease pain to 6/10 in her left shoulder for increased ability to rest at night. Short Term Goal 4 Progress: Progressing toward goal Short Term Goal 5: Patient will decrease fascial restrictions to moderate in her left shoulder region.  Short Term Goal 5 Progress: Progressing toward goal Long Term Goals Long Term Goal 1: Patient will return to prior level of independence with all B/IADLS, work, and leisure activities.  Long Term Goal 1 Progress: Progressing toward goal Long Term Goal 2: Patient will improve left shoulder AROM to WNL for increased ability to reach overhead and pull seatbelt. Long Term Goal 2 Progress: Progressing toward goal Long Term Goal 3: Patient will improve left shoulder strength to 4+/36for increased ability to lift bags of groceries.  Long Term Goal 3 Progress: Progressing toward goal Long Term Goal 4: Patient will decrease pain in her left shoulder to 3/10 or better when sleeping at night.  Long Term Goal 4 Progress: Progressing toward goal Long Term Goal 5: Patient will have minimal or less fascial restrictions in her left shoulder region. Long Term Goal 5 Progress: Progressing toward goal  Problem List Patient Active Problem List   Diagnosis Date Noted  . Pain in joint, shoulder region 06/16/2013  . Muscle weakness (generalized) 06/16/2013  . Bursitis of shoulder region 06/07/2013  . Ankle fracture 08/18/2011  . Ankle fracture, lateral malleolus, closed 05/20/2011    End of Session Activity Tolerance: Patient tolerated treatment well General Behavior During Therapy: [facility] for tasks assessed/performed  GO    Shirlean Mylar,  OTR/L  06/20/2013, 10:30 AM

## 2013-06-22 ENCOUNTER — Ambulatory Visit (HOSPITAL_COMMUNITY)
Admission: RE | Admit: 2013-06-22 | Discharge: 2013-06-22 | Disposition: A | Discharge status: Home / Self Care | Payer: 59 | Source: Ambulatory Visit | Attending: Orthopedic Surgery | Admitting: Orthopedic Surgery

## 2013-06-22 NOTE — Progress Notes (Signed)
Occupational Therapy Treatment Patient Details  Name: Kayla Wilkinson MRN: [id number] Date of Birth: October 08, 1970  Today's Date: 06/22/2013 Time: 1112-1210 OT Time Calculation (min): 58 min Manual 4098-1191 (27') TherExercises 1127-1155 (28') Electrical Stim 1155-1210 (15')  Visit#: 4 of 18  Re-eval: 07/14/13       Subjective Symptoms/Limitations Symptoms: S:  It still hurts but I can tell that what you guys are doing is helping Pain Assessment Currently in Pain?: Yes Pain Score: 9  Pain Location: Shoulder Pain Orientation: Left Pain Type: Acute pain Multiple Pain Sites: No     Exercise/Treatments Supine Protraction: PROM;AAROM;10 reps Horizontal ABduction: PROM;AAROM;10 reps External Rotation: PROM;AAROM;10 reps Internal Rotation: PROM;AAROM;10 reps Flexion: PROM;AAROM;10 reps ABduction: PROM;AAROM;10 reps Seated Elevation: AROM;10 reps Extension: AROM;10 reps Row: AROM;10 reps Therapy Ball Flexion: 20 reps ABduction: 20 reps      Modalities Modalities: Electrical Stimulation Manual Therapy Manual Therapy: Myofascial release Myofascial Release: MFR and manual stretching to decrease pain and fascial restrictions and improve pain free mobility in right scapular, shoulder, trapezius, and upper arm region Electrical Stimulation Electrical Stimulation Location: IFES to left shoulder with heat. Electrical Stimulation Action: pain control Electrical Stimulation Parameters: sweeping 10.5 intensity for 15' Electrical Stimulation Goals: Pain Weight Bearing Technique Weight Bearing Technique: No  Occupational Therapy Assessment and Plan OT Assessment and Plan Clinical Impression Statement: A:  Patient tolerated MFR this date with increased ability to relax guarding position and increase range this date.  Patient continues with increased complaint of pain  OT Plan: P:  attempt scapular stability with theraband.    Goals Short Term Goals Short Term Goal 1: Patient  will be educated on a HEP. Short Term Goal 1 Progress: Progressing toward goal Short Term Goal 2: Patient will improve PROM to [facility] for increased independence with B/IADLs. Short Term Goal 2 Progress: Progressing toward goal Short Term Goal 3: Patient will improve left shoulder strength to 4/5 for increased ability to lift casserole dishes.  Short Term Goal 3 Progress: Progressing toward goal Short Term Goal 4: Patient will decrease pain to 6/10 in her left shoulder for increased ability to rest at night. Short Term Goal 4 Progress: Progressing toward goal Short Term Goal 5: Patient will decrease fascial restrictions to moderate in her left shoulder region.  Short Term Goal 5 Progress: Progressing toward goal Long Term Goals Long Term Goal 1: Patient will return to prior level of independence with all B/IADLS, work, and leisure activities.  Long Term Goal 1 Progress: Progressing toward goal Long Term Goal 2: Patient will improve left shoulder AROM to WNL for increased ability to reach overhead and pull seatbelt. Long Term Goal 2 Progress: Progressing toward goal Long Term Goal 3: Patient will improve left shoulder strength to 4+/90for increased ability to lift bags of groceries.  Long Term Goal 3 Progress: Progressing toward goal Long Term Goal 4: Patient will decrease pain in her left shoulder to 3/10 or better when sleeping at night.  Long Term Goal 4 Progress: Progressing toward goal Long Term Goal 5: Patient will have minimal or less fascial restrictions in her left shoulder region. Long Term Goal 5 Progress: Progressing toward goal  Problem List Patient Active Problem List   Diagnosis Date Noted  . Pain in joint, shoulder region 06/16/2013  . Muscle weakness (generalized) 06/16/2013  . Bursitis of shoulder region 06/07/2013  . Ankle fracture 08/18/2011  . Ankle fracture, lateral malleolus, closed 05/20/2011    End of Session Activity Tolerance: Patient tolerated treatment  well  General Behavior During Therapy: [facility] for tasks assessed/performed  GO    Velora Mediate, OTR/L 06/22/2013, 12:49 PM

## 2013-06-24 ENCOUNTER — Ambulatory Visit (HOSPITAL_COMMUNITY)
Admission: RE | Admit: 2013-06-24 | Discharge: 2013-06-24 | Disposition: A | Discharge status: Home / Self Care | Payer: 59 | Source: Ambulatory Visit | Attending: Family Medicine | Admitting: Family Medicine

## 2013-06-24 NOTE — Progress Notes (Signed)
Occupational Therapy Treatment Patient Details  Name: Kayla Wilkinson MRN: [id number] Date of Birth: 19-Sep-1970  Today's Date: 06/24/2013 Time: 0940-1040 OT Time Calculation (min): 60 min Manual  0940-1000 (20') TherExercises 1000-1025 (25') Electrical Stimulation 1025-1040 (15')   Visit#: 5 of 18  Re-eval: 07/14/13    Subjective Symptoms/Limitations Symptoms: S: I am still hurting alot, but i slept better last night than I have been sleeping.  Pain Assessment Currently in Pain?: Yes Pain Score: 9  Pain Location: Shoulder Pain Orientation: Left Pain Type: Acute pain Multiple Pain Sites: No   Exercise/Treatments Supine Protraction: PROM;AAROM;10 reps Horizontal ABduction: PROM;AAROM;10 reps External Rotation: PROM;AAROM;10 reps Internal Rotation: PROM;AAROM;10 reps Flexion: PROM;AAROM;10 reps ABduction: PROM;AAROM;10 reps Seated Elevation: AROM;10 reps Extension: AROM;10 reps Row: AROM;10 reps Standing External Rotation: Theraband;10 reps Theraband Level (Shoulder External Rotation): Level 2 (Red) Internal Rotation: Theraband;10 reps Theraband Level (Shoulder Internal Rotation): Level 2 (Red) Extension: Theraband;10 reps Theraband Level (Shoulder Extension): Level 2 (Red) Row: Theraband;10 reps Theraband Level (Shoulder Row): Level 2 (Red) Therapy Ball Flexion: 20 reps ABduction: 20 reps    Modalities Modalities: Electrical Stimulation Manual Therapy Manual Therapy: Myofascial release Myofascial Release: MFR and manual stretching to decrease pain and fascial restrictions and improve pain free mobility in right scapular, shoulder, trapezius, and upper arm region Electrical Stimulation Electrical Stimulation Location: IFES to left shoulder with heat. Electrical Stimulation Action: pain control Electrical Stimulation Parameters: sweeping 10.5 intensity for 15' Electrical Stimulation Goals: Pain Weight Bearing Technique Weight Bearing Technique:  No  Occupational Therapy Assessment and Plan OT Assessment and Plan Clinical Impression Statement: A:  Patient with continued imporved tolerance for MFR this date.  Initiated theraband exercises wtih no complaint of increased pain.  Good tolerance of estim with heat with no signs/symptoms of difficulty  OT Plan: P: Theraband exercises for scapular stability  and  initiate wall wash.    Goals Short Term Goals Short Term Goal 1: Patient will be educated on a HEP. Short Term Goal 1 Progress: Progressing toward goal Short Term Goal 2: Patient will improve PROM to [facility] for increased independence with B/IADLs. Short Term Goal 2 Progress: Progressing toward goal Short Term Goal 3: Patient will improve left shoulder strength to 4/5 for increased ability to lift casserole dishes.  Short Term Goal 3 Progress: Progressing toward goal Short Term Goal 4: Patient will decrease pain to 6/10 in her left shoulder for increased ability to rest at night. Short Term Goal 4 Progress: Progressing toward goal Short Term Goal 5: Patient will decrease fascial restrictions to moderate in her left shoulder region.  Short Term Goal 5 Progress: Progressing toward goal Long Term Goals Long Term Goal 1: Patient will return to prior level of independence with all B/IADLS, work, and leisure activities.  Long Term Goal 1 Progress: Progressing toward goal Long Term Goal 2: Patient will improve left shoulder AROM to WNL for increased ability to reach overhead and pull seatbelt. Long Term Goal 2 Progress: Progressing toward goal Long Term Goal 3: Patient will improve left shoulder strength to 4+/37for increased ability to lift bags of groceries.  Long Term Goal 3 Progress: Progressing toward goal Long Term Goal 4: Patient will decrease pain in her left shoulder to 3/10 or better when sleeping at night.  Long Term Goal 4 Progress: Progressing toward goal Long Term Goal 5: Patient will have minimal or less fascial restrictions  in her left shoulder region. Long Term Goal 5 Progress: Progressing toward goal  Problem List Patient Active Problem  List   Diagnosis Date Noted  . Pain in joint, shoulder region 06/16/2013  . Muscle weakness (generalized) 06/16/2013  . Bursitis of shoulder region 06/07/2013  . Ankle fracture 08/18/2011  . Ankle fracture, lateral malleolus, closed 05/20/2011    End of Session Activity Tolerance: Patient tolerated treatment well General Behavior During Therapy: [facility] for tasks assessed/performed  GO    Velora Mediate, OTR/L  06/24/2013, 10:42 AM

## 2013-06-27 ENCOUNTER — Inpatient Hospital Stay (HOSPITAL_COMMUNITY): Admission: RE | Admit: 2013-06-27 | Payer: 59 | Source: Ambulatory Visit | Admitting: Specialist

## 2013-06-29 ENCOUNTER — Ambulatory Visit (HOSPITAL_COMMUNITY)
Admission: RE | Admit: 2013-06-29 | Discharge: 2013-06-29 | Disposition: A | Discharge status: Home / Self Care | Payer: 59 | Source: Ambulatory Visit | Attending: Family Medicine | Admitting: Family Medicine

## 2013-07-01 ENCOUNTER — Ambulatory Visit (HOSPITAL_COMMUNITY): Payer: 59 | Admitting: Occupational Therapy

## 2013-07-04 ENCOUNTER — Ambulatory Visit (HOSPITAL_COMMUNITY)
Admission: RE | Admit: 2013-07-04 | Discharge: 2013-07-04 | Disposition: A | Discharge status: Home / Self Care | Payer: 59 | Source: Ambulatory Visit | Attending: Family Medicine | Admitting: Family Medicine

## 2013-07-04 DIAGNOSIS — M6281 Muscle weakness (generalized): Secondary | ICD-10-CM

## 2013-07-04 DIAGNOSIS — M25512 Pain in left shoulder: Secondary | ICD-10-CM

## 2013-07-04 NOTE — Progress Notes (Signed)
Occupational Therapy Treatment Patient Details  Name: Kayla Wilkinson MRN: [id number] Date of Birth: 1970-08-06  Today's Date: 07/04/2013 Time: 4098-1191 OT Time Calculation (min): 75 min Manual  - 907-930 (23') Therapeutic Exercise - 930 - 1000 (30') IFES w heat -1000-1022 (15')  Visit#: 7 of 18  Re-eval: 07/14/13   Subjective S: "I'm still having 9/10 pain all the time. Its really sharp pain."  Pain Assessment Currently in Pain?: Yes Pain Score: 9  Pain Location: Shoulder Pain Orientation: Left Pain Type: Acute pain Multiple Pain Sites: No  Exercise/Treatments Supine External Rotation: PROM;5 reps;AAROM;12 reps Internal Rotation: PROM;5 reps;AAROM;12 reps Flexion: PROM;Both;AAROM;12 reps ABduction: PROM;5 reps;AAROM;15 reps Seated Elevation: AROM;12 reps Extension: AROM;12 reps Row: AROM;12 reps External Rotation: AROM;AAROM;12 reps Internal Rotation: PROM;AAROM;12 reps Flexion: PROM;AAROM;12 reps Abduction: PROM;AAROM;12 reps   Standing External Rotation: Theraband;15 reps Theraband Level (Shoulder External Rotation): Level 2 (Red) Internal Rotation: Theraband;15 reps Theraband Level (Shoulder Internal Rotation): Level 2 (Red) Extension: Theraband;15 reps Theraband Level (Shoulder Extension): Level 2 (Red) Row: Theraband;15 reps Theraband Level (Shoulder Row): Level 2 (Red) Therapy Ball Flexion: 25 reps ABduction: 25 reps ROM / Strengthening / Isometric Strengthening Wall Wash: 1' Proximal Shoulder Strengthening, Supine: Add next visit      Manual Therapy Manual Therapy: Myofascial release Myofascial Release: MFR and manual stretching to decrease pain and fascial restrictions and improve pain free mobility in left scapular, shoulder, trapezius, and upper arm region Electrical Stimulation Electrical Stimulation Location: IFES to left shoulder with heat. Electrical Stimulation Action: pain control Electrical Stimulation Parameters: sweeping, 15.0  intensity for 15 minutes Electrical Stimulation Goals: Pain  Occupational Therapy Assessment and Plan OT Assessment and Plan Clinical Impression Statement: A: Pt continues to have increased, constant, sharp pain in anterior shoulder region. Pt tolerated increased reps of ROm and stregthening exersies, and addition of wall wash. Pain remains after e-stim. Pt will benefit from skilled therapeutic intervention in order to improve on the following deficits: Decreased range of motion;Decreased strength;Increased fascial restricitons;Pain Rehab Potential: Good OT Plan: Decrease pain levels, initiate R/L ball circles, initiate proximal shoulder stregthening   Goals Short Term Goals Short Term Goal 1: Patient will be educated on a HEP. Short Term Goal 1 Progress: Progressing toward goal Short Term Goal 2: Patient will improve PROM to [facility] for increased independence with B/IADLs. Short Term Goal 2 Progress: Progressing toward goal Short Term Goal 3: Patient will improve left shoulder strength to 4/5 for increased ability to lift casserole dishes.  Short Term Goal 3 Progress: Progressing toward goal Short Term Goal 4: Patient will decrease pain to 6/10 in her left shoulder for increased ability to rest at night. Short Term Goal 4 Progress: Progressing toward goal Short Term Goal 5: Patient will decrease fascial restrictions to moderate in her left shoulder region.  Short Term Goal 5 Progress: Progressing toward goal Long Term Goals Long Term Goal 1: Patient will return to prior level of independence with all B/IADLS, work, and leisure activities.  Long Term Goal 1 Progress: Progressing toward goal Long Term Goal 2: Patient will improve left shoulder AROM to WNL for increased ability to reach overhead and pull seatbelt. Long Term Goal 2 Progress: Progressing toward goal Long Term Goal 3: Patient will improve left shoulder strength to 4+/63for increased ability to lift bags of groceries.  Long Term  Goal 4: Patient will decrease pain in her left shoulder to 3/10 or better when sleeping at night.  Long Term Goal 4 Progress: Progressing toward goal Long Term  Goal 5: Patient will have minimal or less fascial restrictions in her left shoulder region. Long Term Goal 5 Progress: Progressing toward goal  Problem List Patient Active Problem List   Diagnosis Date Noted  . Pain in joint, shoulder region 06/16/2013  . Muscle weakness (generalized) 06/16/2013  . Bursitis of shoulder region 06/07/2013  . Ankle fracture 08/18/2011  . Ankle fracture, lateral malleolus, closed 05/20/2011    End of Session Activity Tolerance: Patient tolerated treatment well General Behavior During Therapy: [facility] for tasks assessed/performed  GO   Shirlean Mylar, OTR/L 07/04/2013, 10:24 AM

## 2013-07-04 NOTE — Progress Notes (Signed)
Occupational Therapy Treatment Patient Details  Name: Kayla Wilkinson MRN: [id number] Date of Birth: 1971/05/11  Today's Date: 06/29/2013 Time: 1191-4782 OT Time Calculation (min): 60 min Manual 9562-1308 (20') TherExercises 6578-4696 (25') Electrical Stimulation [phone] (15')   Visit#: 6 of 18  Re-eval: 07/14/13    Authorization:    Authorization Time Period:    Authorization Visit#:   of    Subjective Symptoms/Limitations Symptoms: S: I have been working on my stuff at home and I can tell it is helping me.  Pain Assessment Currently in Pain?: Yes Pain Score: 9  Pain Location: Shoulder Pain Orientation: Left Pain Type: Acute pain Multiple Pain Sites: No      Exercise/Treatments   Seated Elevation: AROM;10 reps Extension: AROM;10 reps Row: AROM;10 reps External Rotation: PROM;AAROM;10 reps Internal Rotation: PROM;AAROM;10 reps Flexion: PROM;AAROM;10 reps Abduction: PROM;AAROM;10 reps Standing External Rotation: Theraband;12 reps Theraband Level (Shoulder External Rotation): Level 2 (Red) Internal Rotation: Theraband;12 reps Theraband Level (Shoulder Internal Rotation): Level 2 (Red) Extension: Theraband;12 reps Theraband Level (Shoulder Extension): Level 2 (Red) Row: Theraband;12 reps Theraband Level (Shoulder Row): Level 2 (Red)   Therapy Ball Flexion: 20 reps ABduction: 20 reps        Modalities Modalities: Electrical Stimulation Manual Therapy Manual Therapy: Myofascial release Myofascial Release: MFR and manual stretching to decrease pain and fascial restrictions and improve pain free mobility in right scapular, shoulder, trapezius, and upper arm region Electrical Stimulation Electrical Stimulation Location: IFES to left shoulder with heat. Electrical Stimulation Action: pain control Electrical Stimulation Parameters: sweeping 10.5 intensity for 15' Electrical Stimulation Goals: Pain Weight Bearing Technique Weight Bearing Technique:  No  Occupational Therapy Assessment and Plan OT Assessment and Plan Clinical Impression Statement: A: Continues with increased complaint of pain however improved tolerance for MFR and exercises this date.  No signs/symptoms of difficulty following estim treatment this date.   OT Plan: P:  Initiate wall wash.    Goals Short Term Goals Short Term Goal 1: Patient will be educated on a HEP. Short Term Goal 1 Progress: Progressing toward goal Short Term Goal 2: Patient will improve PROM to [facility] for increased independence with B/IADLs. Short Term Goal 2 Progress: Progressing toward goal Short Term Goal 3: Patient will improve left shoulder strength to 4/5 for increased ability to lift casserole dishes.  Short Term Goal 3 Progress: Progressing toward goal Short Term Goal 4: Patient will decrease pain to 6/10 in her left shoulder for increased ability to rest at night. Short Term Goal 4 Progress: Progressing toward goal Short Term Goal 5: Patient will decrease fascial restrictions to moderate in her left shoulder region.  Short Term Goal 5 Progress: Progressing toward goal Long Term Goals Long Term Goal 1: Patient will return to prior level of independence with all B/IADLS, work, and leisure activities.  Long Term Goal 1 Progress: Progressing toward goal Long Term Goal 2: Patient will improve left shoulder AROM to WNL for increased ability to reach overhead and pull seatbelt. Long Term Goal 2 Progress: Progressing toward goal Long Term Goal 3: Patient will improve left shoulder strength to 4+/23for increased ability to lift bags of groceries.  Long Term Goal 3 Progress: Progressing toward goal Long Term Goal 4: Patient will decrease pain in her left shoulder to 3/10 or better when sleeping at night.  Long Term Goal 4 Progress: Progressing toward goal Long Term Goal 5: Patient will have minimal or less fascial restrictions in her left shoulder region. Long Term Goal 5 Progress: Progressing toward  goal  Problem List Patient Active Problem List   Diagnosis Date Noted  . Pain in joint, shoulder region 06/16/2013  . Muscle weakness (generalized) 06/16/2013  . Bursitis of shoulder region 06/07/2013  . Ankle fracture 08/18/2011  . Ankle fracture, lateral malleolus, closed 05/20/2011    End of Session Activity Tolerance: Patient tolerated treatment well General Behavior During Therapy: [facility] for tasks assessed/performed  GO    Eulala Newcombe,OTR/L  06/29/2013, 10:59 AM

## 2013-07-06 ENCOUNTER — Inpatient Hospital Stay (HOSPITAL_COMMUNITY): Admission: RE | Admit: 2013-07-06 | Payer: 59 | Source: Ambulatory Visit

## 2013-07-08 ENCOUNTER — Ambulatory Visit (HOSPITAL_COMMUNITY)
Admission: RE | Admit: 2013-07-08 | Discharge: 2013-07-08 | Disposition: A | Discharge status: Home / Self Care | Payer: BC Managed Care – PPO | Source: Ambulatory Visit | Attending: Family Medicine | Admitting: Family Medicine

## 2013-07-08 DIAGNOSIS — M25519 Pain in unspecified shoulder: Secondary | ICD-10-CM | POA: Insufficient documentation

## 2013-07-08 DIAGNOSIS — M6281 Muscle weakness (generalized): Secondary | ICD-10-CM | POA: Insufficient documentation

## 2013-07-08 DIAGNOSIS — IMO0001 Reserved for inherently not codable concepts without codable children: Secondary | ICD-10-CM | POA: Insufficient documentation

## 2013-07-08 DIAGNOSIS — M25619 Stiffness of unspecified shoulder, not elsewhere classified: Secondary | ICD-10-CM | POA: Insufficient documentation

## 2013-07-08 NOTE — Progress Notes (Signed)
Occupational Therapy Treatment Patient Details  Name: Kayla Wilkinson MRN: [id number] Date of Birth: 09/03/1970  Today's Date: 07/08/2013 Time: 5188-4166 OT Time Calculation (min): 45 min Manual Therapy - 9:40-9:58 (18') TherExercise - 9:58-10:25 (27')  Visit#: 8 of 18  Re-eval: 07/14/13    Subjective Symptoms/Limitations Symptoms: "The morning's are always a little harder on my shoulder - its still a 9/10" Pain Assessment Currently in Pain?: Yes Pain Score: 9  Pain Location: Shoulder Pain Orientation: Left Pain Type: Acute pain   Exercise/Treatments Supine Protraction: AAROM;12 reps Horizontal ABduction: AAROM;15 reps External Rotation: PROM;5 reps;AAROM;15 reps Internal Rotation: PROM;5 reps;AAROM;15 reps Flexion: PROM;5 reps;AAROM;15 reps ABduction: PROM;5 reps;AAROM;15 reps Seated Elevation: AROM;15 reps Extension: AAROM;15 reps Row: AAROM;15 reps External Rotation: AROM;15 reps Internal Rotation: AROM;15 reps Therapy Ball Flexion: 25 reps ABduction: 25 reps Right/Left: 5 reps (each direction) ROM / Strengthening / Isometric Strengthening Proximal Shoulder Strengthening, Supine: Add next visit      Manual Therapy Manual Therapy: Myofascial release Myofascial Release: MFR and manual stretching to decrease pain and fascial restrictions and improve pain free mobility in left scapular, shoulder, trapezius, and upper arm region  Occupational Therapy Assessment and Plan OT Assessment and Plan Clinical Impression Statement: A: Discussed w patient pain scale, as pt cont to display 9/10 pain despite improved activity tolerance. Pt tolerated well R/L ball rotations. E-stim not performed this date - will resume if pain increases. OT Plan: P: Assess results of no e-stim and increased reps in exercises. Add proximal shoulder strengthening.   Goals Short Term Goals Short Term Goal 1: Patient will be educated on a HEP. Short Term Goal 1 Progress: Progressing toward  goal Short Term Goal 2: Patient will improve PROM to [facility] for increased independence with B/IADLs. Short Term Goal 2 Progress: Progressing toward goal Short Term Goal 3: Patient will improve left shoulder strength to 4/5 for increased ability to lift casserole dishes.  Short Term Goal 3 Progress: Progressing toward goal Short Term Goal 4: Patient will decrease pain to 6/10 in her left shoulder for increased ability to rest at night. Short Term Goal 4 Progress: Progressing toward goal Short Term Goal 5: Patient will decrease fascial restrictions to moderate in her left shoulder region.  Short Term Goal 5 Progress: Progressing toward goal Long Term Goals Long Term Goal 1: Patient will return to prior level of independence with all B/IADLS, work, and leisure activities.  Long Term Goal 1 Progress: Progressing toward goal Long Term Goal 2: Patient will improve left shoulder AROM to WNL for increased ability to reach overhead and pull seatbelt. Long Term Goal 2 Progress: Progressing toward goal Long Term Goal 3: Patient will improve left shoulder strength to 4+/88for increased ability to lift bags of groceries.  Long Term Goal 3 Progress: Progressing toward goal Long Term Goal 4: Patient will decrease pain in her left shoulder to 3/10 or better when sleeping at night.  Long Term Goal 4 Progress: Progressing toward goal Long Term Goal 5: Patient will have minimal or less fascial restrictions in her left shoulder region. Long Term Goal 5 Progress: Progressing toward goal  Problem List Patient Active Problem List   Diagnosis Date Noted  . Pain in joint, shoulder region 06/16/2013  . Muscle weakness (generalized) 06/16/2013  . Bursitis of shoulder region 06/07/2013  . Ankle fracture 08/18/2011  . Ankle fracture, lateral malleolus, closed 05/20/2011    End of Session Activity Tolerance: Patient tolerated treatment well General Behavior During Therapy: [facility] for tasks assessed/performed  [place], MS, OTR/L [phone]  07/08/2013, 10:35 AM

## 2013-07-11 ENCOUNTER — Ambulatory Visit (HOSPITAL_COMMUNITY)
Admission: RE | Admit: 2013-07-11 | Discharge: 2013-07-11 | Disposition: A | Discharge status: Home / Self Care | Payer: BC Managed Care – PPO | Source: Ambulatory Visit | Attending: Family Medicine | Admitting: Family Medicine

## 2013-07-11 NOTE — Progress Notes (Signed)
Occupational Therapy Treatment Patient Details  Name: Kayla Wilkinson MRN: [id number] Date of Birth: [birth year]  Today's Date: 07/11/2013 Time: 2841-3244 OT Time Calculation (min): 40 min Manual [phone] (23') Therapeutic Exercise [phone] (17')  Visit#: 9 of 18  Re-eval: 07/14/13   Subjective Symptoms/Limitations Symptoms: "i started my walking again - and thats really making my shoulder ache more with the arm swinging." Pain Assessment Currently in Pain?: Yes Pain Score: 9  Pain Location: Shoulder Pain Orientation: Left Pain Type: Acute pain  Exercise/Treatments Supine Protraction: AAROM;15 reps Horizontal ABduction: AAROM;15 reps External Rotation: PROM;5 reps;AAROM;15 reps Internal Rotation: PROM;5 reps;AAROM;15 reps Flexion: PROM;5 reps;AAROM;15 reps ABduction: PROM;5 reps;AAROM;15 reps   ROM / Strengthening / Isometric Strengthening Proximal Shoulder Strengthening, Supine: 15" each     Manual Therapy Manual Therapy: Myofascial release Myofascial Release: MFR and manual stretching to left upper arm, shoulder region, scapular region and trapezius region to decrease pain and fascial restrictions and improve pain free mobility Weight Bearing Technique Weight Bearing Technique: No  Occupational Therapy Assessment and Plan OT Assessment and Plan Clinical Impression Statement: A: Pt presenting w increased shoulder pain this date due to self implementation of walking program and arm swing. Discussed w pt benefits of cont walking and recommended pt maintain (not increase) current walking and observe arm swing.  Pt presented w increased tightness of anterior upper arm and superior scapular region this date, and incresed pain w internal rotation. Pt experienced x2 muscle spasms below axillary region w shoulder flexion this date. OT Plan: P: Re-eval due Wednesday 1/7.  Cont to assess effects of no e-stim, and resume if pain increases. Discuss cont effects of walking on shoulder  mobility/pain.   Goals Short Term Goals Short Term Goal 1: Patient will be educated on a HEP. Short Term Goal 1 Progress: Progressing toward goal Short Term Goal 2: Patient will improve PROM to [facility] for increased independence with B/IADLs. Short Term Goal 2 Progress: Progressing toward goal Short Term Goal 3: Patient will improve left shoulder strength to 4/5 for increased ability to lift casserole dishes.  Short Term Goal 3 Progress: Progressing toward goal Short Term Goal 4: Patient will decrease pain to 6/10 in her left shoulder for increased ability to rest at night. Short Term Goal 4 Progress: Progressing toward goal Short Term Goal 5: Patient will decrease fascial restrictions to moderate in her left shoulder region.  Short Term Goal 5 Progress: Progressing toward goal Long Term Goals Long Term Goal 1: Patient will return to prior level of independence with all B/IADLS, work, and leisure activities.  Long Term Goal 1 Progress: Progressing toward goal Long Term Goal 2: Patient will improve left shoulder AROM to WNL for increased ability to reach overhead and pull seatbelt. Long Term Goal 2 Progress: Progressing toward goal Long Term Goal 3: Patient will improve left shoulder strength to 4+/14for increased ability to lift bags of groceries.  Long Term Goal 3 Progress: Progressing toward goal Long Term Goal 4: Patient will decrease pain in her left shoulder to 3/10 or better when sleeping at night.  Long Term Goal 4 Progress: Progressing toward goal Long Term Goal 5: Patient will have minimal or less fascial restrictions in her left shoulder region. Long Term Goal 5 Progress: Progressing toward goal  Problem List Patient Active Problem List   Diagnosis Date Noted  . Pain in joint, shoulder region 06/16/2013  . Muscle weakness (generalized) 06/16/2013  . Bursitis of shoulder region 06/07/2013  . Ankle fracture 08/18/2011  .  Ankle fracture, lateral malleolus, closed 05/20/2011     End of Session Activity Tolerance: Patient tolerated treatment well;Patient limited by pain General Behavior During Therapy: [facility] for tasks assessed/performed  GO   Bea Graff, [place], OTR/L [phone]  07/11/2013, 11:43 AM

## 2013-07-13 ENCOUNTER — Ambulatory Visit (HOSPITAL_COMMUNITY)
Admission: RE | Admit: 2013-07-13 | Discharge: 2013-07-13 | Disposition: A | Discharge status: Home / Self Care | Payer: BC Managed Care – PPO | Source: Ambulatory Visit | Attending: Family Medicine | Admitting: Family Medicine

## 2013-07-13 NOTE — Evaluation (Signed)
Occupational Therapy Re-Evaluation  Patient Details  Name: Kayla Wilkinson MRN: [id number] Date of Birth: 1970-10-31  Today's Date: 07/13/2013 Time: 5361-4431 OT Time Calculation (min): 41 min Manual [phone] (26') Re-Evaluation 1010-1025 (78')  Visit#: 10 of 18  Re-eval: 07/14/13   Past Medical History:  Past Medical History  Diagnosis Date  . Diabetes mellitus    Past Surgical History: No past surgical history on file.  Subjective Symptoms/Limitations Symptoms: "i watched my walking - and I am swinging my arm over. so i was a little more careful with that, and I think it helped!" Pain Assessment Pain Score: 8  Pain Location: Shoulder Pain Orientation: Left Pain Type: Acute pain  Assessment Additional Assessments LUE AROM (degrees) LUE Overall AROM Comments: assessed in seated ER/IR with shoulder adducted Left Shoulder Flexion: 146 Degrees (85) Left Shoulder ABduction: 100 Degrees (88) Left Shoulder Internal Rotation: 60 Degrees (86) Left Shoulder External Rotation: 65 Degrees (58) LUE PROM (degrees) LUE Overall PROM Comments: assessed in supine current (initial evaluation)  ER/IR with shoulder adducted  Left Shoulder Flexion: 113 Degrees (95) Left Shoulder ABduction: 92 Degrees (95) Left Shoulder Internal Rotation: 93 Degrees (90) Left Shoulder External Rotation: 58 Degrees (70) LUE Strength LUE Overall Strength Comments: assessed in standing ER/IR with shoulder adducted  Left Shoulder Flexion: 4/5 Left Shoulder ABduction: 4/5 Left Shoulder Internal Rotation:  (4+/5 (4/5)) Left Shoulder External Rotation:  (4+/5 (3+/5)) Palpation Palpation: decreased facial restrictions - mod    Exercise/Treatments Supine External Rotation: PROM;5 reps Internal Rotation: PROM;5 reps Flexion: PROM;5 reps ABduction: PROM;5 reps  Occupational Therapy Assessment and Plan OT Assessment and Plan Clinical Impression Statement: A: Re-assessment performed this date. Pt has gains  in AROM and has met 4/5 STG. Pain levels remain 8+/10. Pt returns to MD in 2 weeks to discuss need for MRI- discussed therapy options and pt will cont therapy for remaing 2 weeks. Pt has been using LUE more in daily activities and has begun walking again, allowing her arm to swing. Pt will benefit from skilled therapeutic intervention in order to improve on the following deficits: Decreased range of motion;Decreased strength;Increased fascial restricitons;Impaired UE functional use;Pain OT Plan: P: Cont to increase ROM/strenghtening. Explore pain mngmt options. Complete FOTO.   Goals Short Term Goals Short Term Goal 1 Progress: Met Short Term Goal 2 Progress: Met Short Term Goal 3 Progress: Met Short Term Goal 4 Progress: Progressing toward goal Short Term Goal 5 Progress: Met Long Term Goals Long Term Goal 1 Progress: Progressing toward goal Long Term Goal 2 Progress: Progressing toward goal Long Term Goal 3 Progress: Progressing toward goal Long Term Goal 4 Progress: Progressing toward goal Long Term Goal 5 Progress: Progressing toward goal  Problem List Patient Active Problem List   Diagnosis Date Noted  . Pain in joint, shoulder region 06/16/2013  . Muscle weakness (generalized) 06/16/2013  . Bursitis of shoulder region 06/07/2013  . Ankle fracture 08/18/2011  . Ankle fracture, lateral malleolus, closed 05/20/2011    End of Session Activity Tolerance: Patient tolerated treatment well General Behavior During Therapy: [facility] for tasks assessed/performed  GO Bea Graff, [place], OTR/L [phone]  .07/13/2013, 11:20 AM  Physician Documentation Your signature is required to indicate approval of the treatment plan as stated above.  Please sign and either send electronically or make a copy of this report for your files and return this physician signed original.  Please mark one 1.__approve of plan  2. ___approve of plan with the following  conditions.  ______________________________                                                          _____________________ Physician Signature                                                                                                             Date

## 2013-07-15 ENCOUNTER — Ambulatory Visit (HOSPITAL_COMMUNITY)
Admission: RE | Admit: 2013-07-15 | Discharge: 2013-07-15 | Disposition: A | Discharge status: Home / Self Care | Payer: BC Managed Care – PPO | Source: Ambulatory Visit | Attending: Family Medicine | Admitting: Family Medicine

## 2013-07-15 NOTE — Progress Notes (Signed)
Occupational Therapy Treatment Patient Details  Name: Kayla Wilkinson MRN: [id number] Date of Birth: [birth year]  Today's Date: 07/15/2013 Time: 0947-0962 OT Time Calculation (min): 36 min FOTO Eval (unbilled) 941-955 (14') Manual [phone] (12') Ultrasound 1007-1017 (10')  Visit#: 11 of 18  Re-eval: 07/27/13   Subjective Symptoms/Limitations Symptoms: "I do feel like therapy's helping - I want to keep coming." Special Tests: FOTO 45 (06/16/13 - 29) Pain Assessment Currently in Pain?: Yes Pain Score: 8  Pain Location: Shoulder Pain Orientation: Left Pain Type: Acute pain   Exercise/Treatments Modalities Modalities: Ultrasound Manual Therapy Manual Therapy: Myofascial release Myofascial Release: MFR and manual stretching to left upper arm, shoulder region, scapular region and trapezius region to decrease pain and fascial restrictions and improve pain free mobility.  Ultrasound Ultrasound Location: Upper arm and anterior shoulder regions Ultrasound Parameters: 3 Mghz, continuous, 1.5 frequency for 5 minutes Ultrasound Goals: Pain  Occupational Therapy Assessment and Plan OT Assessment and Plan Clinical Impression Statement: A: Pt completed FOTO this date - score improved from 29 to 45 (out of 100).  Began ultrasound tx this date for pain relief - pt tolerated well OT Frequency: Min 3X/week OT Duration: 2 weeks OT Plan: P: Assess effects of ultrasound on pain levels. Cont ultrasound tx as indicated. Continue ROM.   Goals Short Term Goals Short Term Goal 4: Patient will decrease pain to 6/10 in her left shoulder for increased ability to rest at night. Short Term Goal 4 Progress: Progressing toward goal Long Term Goals Long Term Goal 1: Patient will return to prior level of independence with all B/IADLS, work, and leisure activities.  Long Term Goal 1 Progress: Progressing toward goal Long Term Goal 2: Patient will improve left shoulder AROM to WNL for increased ability to  reach overhead and pull seatbelt. Long Term Goal 2 Progress: Progressing toward goal Long Term Goal 3: Patient will improve left shoulder strength to 4+/96for increased ability to lift bags of groceries.  Long Term Goal 3 Progress: Progressing toward goal Long Term Goal 4: Patient will decrease pain in her left shoulder to 3/10 or better when sleeping at night.  Long Term Goal 4 Progress: Progressing toward goal Long Term Goal 5: Patient will have minimal or less fascial restrictions in her left shoulder region. Long Term Goal 5 Progress: Progressing toward goal  Problem List Patient Active Problem List   Diagnosis Date Noted  . Pain in joint, shoulder region 06/16/2013  . Muscle weakness (generalized) 06/16/2013  . Bursitis of shoulder region 06/07/2013  . Ankle fracture 08/18/2011  . Ankle fracture, lateral malleolus, closed 05/20/2011    End of Session Activity Tolerance: Patient tolerated treatment well General Behavior During Therapy: [facility] for tasks assessed/performed  GO   Bea Graff, MS, OTR/L [phone]  07/15/2013, 11:37 AM

## 2013-07-18 ENCOUNTER — Inpatient Hospital Stay (HOSPITAL_COMMUNITY): Admission: RE | Admit: 2013-07-18 | Payer: 59 | Source: Ambulatory Visit

## 2013-07-20 ENCOUNTER — Ambulatory Visit (HOSPITAL_COMMUNITY)
Admission: RE | Admit: 2013-07-20 | Discharge: 2013-07-20 | Disposition: A | Discharge status: Home / Self Care | Payer: BC Managed Care – PPO | Source: Ambulatory Visit | Attending: Family Medicine | Admitting: Family Medicine

## 2013-07-20 NOTE — Progress Notes (Signed)
Occupational Therapy Treatment Patient Details  Name: Kayla Wilkinson MRN: [id number] Date of Birth: [birth year]  Today's Date: 07/20/2013 Time: 3976-7341 OT Time Calculation (min): 38 min Manual [phone] (20') Therapeutic Exercises 1000-1008 (8') Ultrasound 1008-1018 (10')  Visit#: 12 of 18  Re-eval: 07/27/13    Authorization:    Authorization Time Period:    Authorization Visit#:   of    Subjective Symptoms/Limitations Symptoms: "Its just the same - it still catches." Pain Assessment Currently in Pain?: Yes Pain Score: 8  Pain Location: Shoulder Pain Orientation: Left Pain Type: Acute pain  Precautions/Restrictions    Exercise/Treatments Supine External Rotation: PROM;5 reps Internal Rotation: PROM;5 reps Flexion: PROM;5 reps ABduction: PROM;5 reps   Modalities Modalities: Ultrasound Manual Therapy Manual Therapy: Myofascial release Myofascial Release: MFR and manual stretching to left upper arm, shoulder region, scapular region and trapezius region to decrease pain and fascial restrictions and improve pain free mobility.  Ultrasound Ultrasound Location: Upper arm and anterior shoulder regions Ultrasound Parameters: 3 Mghz, continuous, 1.5 frequency for 5 minutes  Occupational Therapy Assessment and Plan OT Assessment and Plan Clinical Impression Statement: A: Pt's pain remains the same. During PROM pt had sudden sharp pain in abuduction - led to numbness/tingling in median nerve distribution of hand. OT Plan: Follow up on pain levels after ultrasound. Follow up on median nerve glides. Resume PROM, AAROM.   Goals Short Term Goals Short Term Goal 4: Patient will decrease pain to 6/10 in her left shoulder for increased ability to rest at night. Short Term Goal 4 Progress: Progressing toward goal Long Term Goals Long Term Goal 1: Patient will return to prior level of independence with all B/IADLS, work, and leisure activities.  Long Term Goal 1 Progress:  Progressing toward goal Long Term Goal 2: Patient will improve left shoulder AROM to WNL for increased ability to reach overhead and pull seatbelt. Long Term Goal 2 Progress: Progressing toward goal Long Term Goal 3: Patient will improve left shoulder strength to 4+/26for increased ability to lift bags of groceries.  Long Term Goal 3 Progress: Progressing toward goal Long Term Goal 4: Patient will decrease pain in her left shoulder to 3/10 or better when sleeping at night.  Long Term Goal 4 Progress: Progressing toward goal Long Term Goal 5: Patient will have minimal or less fascial restrictions in her left shoulder region. Long Term Goal 5 Progress: Progressing toward goal  Problem List Patient Active Problem List   Diagnosis Date Noted  . Pain in joint, shoulder region 06/16/2013  . Muscle weakness (generalized) 06/16/2013  . Bursitis of shoulder region 06/07/2013  . Ankle fracture 08/18/2011  . Ankle fracture, lateral malleolus, closed 05/20/2011    End of Session Activity Tolerance: Patient tolerated treatment well General Behavior During Therapy: WFL for tasks assessed/performed OT Plan of Care OT Home Exercise Plan: Educated pt on Median nerve glide.  [place], MS, OTR/L [phone]  07/20/2013, 12:59 PM

## 2013-07-22 ENCOUNTER — Ambulatory Visit (HOSPITAL_COMMUNITY)
Admission: RE | Admit: 2013-07-22 | Discharge: 2013-07-22 | Disposition: A | Discharge status: Home / Self Care | Payer: BC Managed Care – PPO | Source: Ambulatory Visit | Attending: Occupational Therapy | Admitting: Occupational Therapy

## 2013-07-22 NOTE — Progress Notes (Signed)
Occupational Therapy Treatment Patient Details  Name: Kayla Wilkinson MRN: [id number] Date of Birth: March 25, 1971  Today's Date: 07/22/2013 Time: 2229-7989 OT Time Calculation (min): 38 min Manual 943-953 (10') Therapeutic Exercises [phone] (20') Ultrasound 2119-4174 (8')  Visit#: 13 of 18  Re-eval: 07/27/13    Authorization:    Authorization Time Period:    Authorization Visit#:   of    Subjective Symptoms/Limitations Symptoms: S: "I took two of those pain pills he gave me instead of the one. I slept all through the night." Pain Assessment Currently in Pain?: Yes Pain Score: 8  Pain Location: Shoulder Pain Orientation: Left Pain Type: Acute pain   Exercise/Treatments Supine Protraction: AAROM;10 reps (2 lb bar) Horizontal ABduction: AAROM;10 reps (2 lb bar) External Rotation: PROM;5 reps;AAROM;10 reps (2 lb bar) Internal Rotation: PROM;5 reps;AAROM;10 reps (2 lb bar) Flexion: PROM;5 reps;AAROM;10 reps (2 lb bar) ABduction: PROM;5 reps;AAROM;10 reps (2 lb bar)    Modalities Modalities: Ultrasound Manual Therapy Manual Therapy: Myofascial release Myofascial Release: MFR and manual stretching to left upper arm, shoulder region, scapular region and trapezius region to decrease pain and fascial restrictions and improve pain free mobility. Ultrasound Ultrasound Location: Upper arm and anterior shoulder regions Ultrasound Parameters: 3 Mghz, continuous, 1.5 frequency for 5 minutes Ultrasound Goals: Pain  Occupational Therapy Assessment and Plan OT Assessment and Plan Clinical Impression Statement: A: Pt reports same pain levels. Pt tolerated well  AAROM. Pt reported taking extra pain medication evening after previous session, so unable to determine effectiveness of ultrasound this date. Pt advised to consult with MD prior to changing medication dosage. OT Plan: P: Follow up on pain levels after ultrasound. Follow up on median nerve glides. Focus on exercises.    Goals Short Term Goals Short Term Goal 4: Patient will decrease pain to 6/10 in her left shoulder for increased ability to rest at night. Short Term Goal 4 Progress: Progressing toward goal Long Term Goals Long Term Goal 1: Patient will return to prior level of independence with all B/IADLS, work, and leisure activities.  Long Term Goal 1 Progress: Progressing toward goal Long Term Goal 2: Patient will improve left shoulder AROM to WNL for increased ability to reach overhead and pull seatbelt. Long Term Goal 2 Progress: Progressing toward goal Long Term Goal 3: Patient will improve left shoulder strength to 4+/8for increased ability to lift bags of groceries.  Long Term Goal 3 Progress: Progressing toward goal Long Term Goal 4: Patient will decrease pain in her left shoulder to 3/10 or better when sleeping at night.  Long Term Goal 4 Progress: Progressing toward goal Long Term Goal 5: Patient will have minimal or less fascial restrictions in her left shoulder region. Long Term Goal 5 Progress: Progressing toward goal  Problem List Patient Active Problem List   Diagnosis Date Noted  . Pain in joint, shoulder region 06/16/2013  . Muscle weakness (generalized) 06/16/2013  . Bursitis of shoulder region 06/07/2013  . Ankle fracture 08/18/2011  . Ankle fracture, lateral malleolus, closed 05/20/2011    End of Session Activity Tolerance: Patient tolerated treatment well  [place], [place], OTR/L [phone]  07/22/2013, 12:35 PM

## 2013-07-25 ENCOUNTER — Ambulatory Visit (HOSPITAL_COMMUNITY)
Admission: RE | Admit: 2013-07-25 | Discharge: 2013-07-25 | Disposition: A | Discharge status: Home / Self Care | Payer: BC Managed Care – PPO | Source: Ambulatory Visit | Attending: Family Medicine | Admitting: Family Medicine

## 2013-07-25 NOTE — Progress Notes (Signed)
Occupational Therapy Treatment Patient Details  Name: Kayla Wilkinson MRN: [id number] Date of Birth: 1971-01-14  Today's Date: 07/25/2013 Time: 7829-5621 OT Time Calculation (min): 32 min Manual 0848-1001 (13') TherExercises 1001-1020 (19')  Visit#: 14 of 18  Re-eval: 07/27/13    Subjective Symptoms/Limitations Symptoms: S:  In the evening after she did the ultrasound thing, my pain was like a 9/10... she told me to tell  Pain Assessment Currently in Pain?: Yes Pain Score: 8  Pain Location: Shoulder Pain Orientation: Left Pain Type: Acute pain      Exercise/Treatments Supine Protraction: AAROM;12 reps;Weights Protraction Weight (lbs): 2 bar Horizontal ABduction: AAROM;12 reps;Weights Horizontal ABduction Weight (lbs): 2bar External Rotation: AAROM;12 reps;Weights External Rotation Weight (lbs): 2 bar Internal Rotation: AAROM;12 reps;Weights Internal Rotation Weight (lbs): 2 bar Flexion: AAROM;12 reps;Weights Shoulder Flexion Weight (lbs): 2 bar ABduction: AAROM;12 reps;Weights Shoulder ABduction Weight (lbs): 2 bar Seated Extension: AROM;15 reps Row: AROM;15 reps Horizontal ABduction: AAROM;15 reps (min facilitation from OTR) External Rotation: AROM;15 reps Internal Rotation: AROM;15 reps Standing Other Standing Exercises: combination extension/elevation/IR x 10 reps    ROM / Strengthening / Isometric Strengthening Proximal Shoulder Strengthening, Supine: 1' (1# hand weights)       Manual Therapy Manual Therapy: Myofascial release Myofascial Release: MFR and manual stretching to left upper arm, shoulder region, scapular region and trapezius region to decrease pain and fascial restrictions and improve pain free mobility.  Occupational Therapy Assessment and Plan OT Assessment and Plan Clinical Impression Statement: A: Patient consistently late for therapy appointments therefore focus shortened therapy session.  she  states she has been doing the median nerve  glides as instructed to throughout the day over the weekend with report of stretching pain with continued complaint of 8/10 pain and states glides 'pacifies' the pain.  Patient continues with report of pain 8/10 with all activities.  OT Plan: P: Reassessment measurements for MD appointment    Goals Short Term Goals Short Term Goal 1: Patient will be educated on a HEP. Short Term Goal 1 Progress: Met Short Term Goal 2: Patient will improve PROM to [facility] for increased independence with B/IADLs. Short Term Goal 2 Progress: Met Short Term Goal 3: Patient will improve left shoulder strength to 4/5 for increased ability to lift casserole dishes.  Short Term Goal 3 Progress: Met Short Term Goal 4: Patient will decrease pain to 6/10 in her left shoulder for increased ability to rest at night. Short Term Goal 4 Progress: Progressing toward goal Short Term Goal 5: Patient will decrease fascial restrictions to moderate in her left shoulder region.  Short Term Goal 5 Progress: Met Long Term Goals Long Term Goal 1: Patient will return to prior level of independence with all B/IADLS, work, and leisure activities.  Long Term Goal 1 Progress: Progressing toward goal Long Term Goal 2: Patient will improve left shoulder AROM to WNL for increased ability to reach overhead and pull seatbelt. Long Term Goal 2 Progress: Progressing toward goal Long Term Goal 3: Patient will improve left shoulder strength to 4+/87fr increased ability to lift bags of groceries.  Long Term Goal 3 Progress: Progressing toward goal Long Term Goal 4: Patient will decrease pain in her left shoulder to 3/10 or better when sleeping at night.  Long Term Goal 4 Progress: Progressing toward goal Long Term Goal 5: Patient will have minimal or less fascial restrictions in her left shoulder region. Long Term Goal 5 Progress: Progressing toward goal  Problem List Patient Active Problem List  Diagnosis Date Noted  . Pain in joint, shoulder  region 06/16/2013  . Muscle weakness (generalized) 06/16/2013  . Bursitis of shoulder region 06/07/2013  . Ankle fracture 08/18/2011  . Ankle fracture, lateral malleolus, closed 05/20/2011    End of Session Activity Tolerance: Patient tolerated treatment well General Behavior During Therapy: [facility] for tasks assessed/performed  GO    Donney Rankins, OTR/L 07/25/2013, 12:12 PM

## 2013-07-27 ENCOUNTER — Ambulatory Visit (HOSPITAL_COMMUNITY)
Admission: RE | Admit: 2013-07-27 | Discharge: 2013-07-27 | Disposition: A | Discharge status: Home / Self Care | Payer: BC Managed Care – PPO | Source: Ambulatory Visit | Attending: Family Medicine | Admitting: Family Medicine

## 2013-07-27 NOTE — Evaluation (Signed)
Occupational Therapy Re-Evaluation  Patient Details  Name: Kayla Wilkinson MRN: [id number] Date of Birth: 10/02/1970  Today's Date: 07/27/2013 Time: 7564-3329 OT Time Calculation (min): 80 min ROM/MMT 518-841 1' Therapeutic exercises 915-930 15'  Visit#: 25 of 18  Re-eval: 08/24/13  Assessment Diagnosis: Left Shoulder Bursitis   Past Medical History:  Past Medical History  Diagnosis Date  . Diabetes mellitus    Past Surgical History: No past surgical history on file.  Subjective S:  I can reach overhead easier, and get my shirts on and off easier.  It is most painful at night when I try to sleep and activities in mid range (80-120 degrees) Special Tests: FOTO scored was 45 and is currently 44.74 Pain Assessment Currently in Pain?: Yes Pain Score: 8  Pain Location: Shoulder Pain Orientation: Left Pain Type: Acute pain  Additional Assessments LUE AROM (degrees) LUE Overall AROM Comments: assessed in seated ER/IR with shoulder adducted (07/13/13) Left Shoulder Flexion: 136 Degrees (146) Left Shoulder ABduction: 105 Degrees (100) Left Shoulder Internal Rotation: 90 Degrees (60) Left Shoulder External Rotation: 65 Degrees (65) LUE Strength Left Shoulder Flexion: 4/5 (4/5) Left Shoulder ABduction: 4/5 (4/5) Left Shoulder Internal Rotation: 4/5 (4+/5) Left Shoulder External Rotation: 4/5 (4+/5)     Exercise/Treatments Standing External Rotation: Theraband;15 reps Theraband Level (Shoulder External Rotation): Level 3 (Green) ROM / Strengthening / Isometric Strengthening UBE (Upper Arm Bike): 2' and 2' 1.0       Electrical Stimulation Electrical Stimulation Location: IFES to left shoulder with heat. Electrical Stimulation Action: pain control Electrical Stimulation Parameters: sweeping 15.0 intensity for 15 minutes  Electrical Stimulation Goals: Pain  Occupational Therapy Assessment and Plan OT Assessment and Plan Clinical Impression Statement: A:  Reassessed for  MD progress note.  Patient continues to have high pain in shoulder (8/10) with increased pain and difficulty with mid range movements.  Her AROM and strength have had little improvement and in some cases decreased in the past 2 weeks. OT Plan: P:  Await MD orders for dc vs continuation of treatment.   Goals Short Term Goals Short Term Goal 1: Patient will be educated on a HEP. Short Term Goal 2: Patient will improve PROM to [facility] for increased independence with B/IADLs. Short Term Goal 3: Patient will improve left shoulder strength to 4/5 for increased ability to lift casserole dishes.  Short Term Goal 4: Patient will decrease pain to 6/10 in her left shoulder for increased ability to rest at night. Short Term Goal 4 Progress: Progressing toward goal Short Term Goal 5: Patient will decrease fascial restrictions to moderate in her left shoulder region.  Long Term Goals Long Term Goal 1: Patient will return to prior level of independence with all B/IADLS, work, and leisure activities.  Long Term Goal 1 Progress: Progressing toward goal Long Term Goal 2: Patient will improve left shoulder AROM to WNL for increased ability to reach overhead and pull seatbelt. Long Term Goal 2 Progress: Progressing toward goal Long Term Goal 3: Patient will improve left shoulder strength to 4+/58for increased ability to lift bags of groceries.  Long Term Goal 3 Progress: Progressing toward goal Long Term Goal 4: Patient will decrease pain in her left shoulder to 3/10 or better when sleeping at night.  Long Term Goal 4 Progress: Progressing toward goal Long Term Goal 5: Patient will have minimal or less fascial restrictions in her left shoulder region. Long Term Goal 5 Progress: Progressing toward goal  Problem List Patient Active Problem List  Diagnosis Date Noted  . Pain in joint, shoulder region 06/16/2013  . Muscle weakness (generalized) 06/16/2013  . Bursitis of shoulder region 06/07/2013  . Ankle fracture  08/18/2011  . Ankle fracture, lateral malleolus, closed 05/20/2011    End of Session Activity Tolerance: Patient tolerated treatment well General Behavior During Therapy: [facility] for tasks assessed/performed  GO    Vangie Bicker, OTR/L  07/27/2013, 9:29 AM  Physician Documentation Your signature is required to indicate approval of the treatment plan as stated above.  Please sign and either send electronically or make a copy of this report for your files and return this physician signed original.  Please mark one 1.__approve of plan  2. ___approve of plan with the following conditions.   ______________________________                                                          _____________________ Physician Signature                                                                                                             Date

## 2013-07-28 ENCOUNTER — Ambulatory Visit (INDEPENDENT_AMBULATORY_CARE_PROVIDER_SITE_OTHER): Payer: BC Managed Care – PPO | Admitting: Orthopedic Surgery

## 2013-07-28 ENCOUNTER — Encounter: Payer: Self-pay | Admitting: Orthopedic Surgery

## 2013-07-28 VITALS — Ht 62.5 in | Wt 202.0 lb

## 2013-07-28 DIAGNOSIS — M75102 Unspecified rotator cuff tear or rupture of left shoulder, not specified as traumatic: Secondary | ICD-10-CM

## 2013-07-28 DIAGNOSIS — S43429A Sprain of unspecified rotator cuff capsule, initial encounter: Secondary | ICD-10-CM

## 2013-07-28 MED ORDER — METHOCARBAMOL 500 MG PO TABS: 500.0000 mg | ORAL_TABLET | Freq: Three times a day (TID) | ORAL | Status: DC

## 2013-07-28 MED ORDER — HYDROCODONE-ACETAMINOPHEN 5-325 MG PO TABS: 1.0000 | ORAL_TABLET | Freq: Four times a day (QID) | ORAL | Status: DC | PRN
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
## 2013-07-28 NOTE — Patient Instructions (Signed)
An MRI will be scheduled and the doctor will call you with the results and discuss further treatment options. 

## 2013-07-29 ENCOUNTER — Inpatient Hospital Stay (HOSPITAL_COMMUNITY): Admission: RE | Admit: 2013-07-29 | Payer: 59 | Source: Ambulatory Visit | Admitting: Occupational Therapy

## 2013-08-05 ENCOUNTER — Ambulatory Visit (HOSPITAL_COMMUNITY)
Admission: RE | Admit: 2013-08-05 | Discharge: 2013-08-05 | Disposition: A | Discharge status: Home / Self Care | Payer: BC Managed Care – PPO | Source: Ambulatory Visit | Attending: Orthopedic Surgery | Admitting: Orthopedic Surgery

## 2013-08-05 DIAGNOSIS — M75102 Unspecified rotator cuff tear or rupture of left shoulder, not specified as traumatic: Secondary | ICD-10-CM

## 2013-08-08 ENCOUNTER — Telehealth: Payer: Self-pay | Admitting: Orthopedic Surgery

## 2013-08-08 NOTE — Telephone Encounter (Signed)
Kayla Wilkinson went for her MRI but could not get it done. Said she tried twice but could not do it because she panicked. Said she will need To go where there is an open unit. Her # [phone]

## 2013-08-10 NOTE — Telephone Encounter (Signed)
Renee, did you get this? 

## 2013-08-21 ENCOUNTER — Ambulatory Visit
Admission: RE | Admit: 2013-08-21 | Discharge: 2013-08-21 | Disposition: A | Discharge status: Home / Self Care | Payer: BC Managed Care – PPO | Source: Ambulatory Visit | Attending: Orthopedic Surgery | Admitting: Orthopedic Surgery

## 2013-08-21 ENCOUNTER — Other Ambulatory Visit: Payer: Self-pay | Admitting: Orthopedic Surgery

## 2013-08-21 DIAGNOSIS — M75102 Unspecified rotator cuff tear or rupture of left shoulder, not specified as traumatic: Secondary | ICD-10-CM

## 2013-08-24 ENCOUNTER — Telehealth: Payer: Self-pay | Admitting: Orthopedic Surgery

## 2013-08-24 NOTE — Telephone Encounter (Signed)
Gracelyn Nurse asked if you will call her with her MRI results  Phone # [phone]

## 2013-08-29 ENCOUNTER — Telehealth: Payer: Self-pay | Admitting: Orthopedic Surgery

## 2013-08-29 NOTE — Telephone Encounter (Signed)
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 FINDINGS: Rotator cuff: There is moderate tendinosis of the supraspinatus tendon with a high-grade partial bursal surface tear at its peripheral attachment. Mild tendinosis of the infraspinatus tendon without a discrete tear. Teres minor tendon is intact. Subscapularis tendon is intact.

## 2013-08-29 NOTE — Telephone Encounter (Signed)
Called home and work no answer left message

## 2013-08-31 ENCOUNTER — Telehealth: Payer: Self-pay | Admitting: *Deleted
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
## 2013-09-14 ENCOUNTER — Telehealth: Payer: Self-pay | Admitting: *Deleted

## 2013-09-14 NOTE — Telephone Encounter (Signed)
Patient called and states she got your message regarding her MRI results (operative vs non operative treatment) However, she has a lot of questions legal and medical. I talked with the patient at length regarding surgery. She still seems uneasy.I know you have left several messages on her answering machine, but can we schedule her to come in and discuss with you? Please advise.

## 2013-09-14 NOTE — Telephone Encounter (Signed)
yes

## 2013-09-14 NOTE — Telephone Encounter (Signed)
Scheduled patient for March 26,2015.

## 2013-09-29 ENCOUNTER — Encounter: Payer: Self-pay | Admitting: Orthopedic Surgery

## 2013-09-29 ENCOUNTER — Ambulatory Visit (INDEPENDENT_AMBULATORY_CARE_PROVIDER_SITE_OTHER): Payer: BC Managed Care – PPO | Admitting: Orthopedic Surgery

## 2013-09-29 VITALS — BP 133/93 | Ht 62.5 in | Wt 202.0 lb

## 2013-09-29 DIAGNOSIS — S43429A Sprain of unspecified rotator cuff capsule, initial encounter: Secondary | ICD-10-CM

## 2013-09-29 DIAGNOSIS — M75102 Unspecified rotator cuff tear or rupture of left shoulder, not specified as traumatic: Secondary | ICD-10-CM

## 2013-09-29 MED ORDER — HYDROCODONE-ACETAMINOPHEN 5-325 MG PO TABS: 1.0000 | ORAL_TABLET | Freq: Four times a day (QID) | ORAL | Status: DC | PRN

## 2013-09-29 NOTE — Progress Notes (Signed)
Patient ID: Kayla Wilkinson, female   DOB: 1970-07-11, 43 y.o.   MRN: [id number]  Chief Complaint  Patient presents with  . Follow-up    Discuss MRI results and surgery   Patient came in to discuss her MRI results and possible surgical repair of a partially torn rotator cuff tear. I reviewed the MRI again today with the radiologist. She has a high-grade more than 50% tear of the supraspinatus tendon and she has some degenerative changes in the a.c. joint as well  She is here for approximately 15 minutes a little on her actually. She will be out of work for 2 weeks she'll be unable to type with 2 hands for a total of 6 weeks. She lives alone with her son and she will need an overnight stay in the hospital she will have general anesthesia we'll do an arthroscopic repair of the left shoulder.

## 2013-09-29 NOTE — Patient Instructions (Signed)
Surgery for Rotator Cuff Tear with Rehab Rotator cuff surgery is only recommended for individuals who have experienced persistent disability for greater than 3 months of non-surgical (conservative) treatment. Surgery is not necessary but is recommended for individuals who experience difficulty completing daily activities or athletes who are unable to compete. Rotator cuff tears do not usually heal without surgical intervention. If left alone small rotator cuff tears usually become larger. Younger athletes who have a rotator cuff tear may be recommended for surgery without attempting conservative rehabilitation. The purpose of surgery is to regain function of the shoulder joint and eliminate pain associated with the injury. In addition to repairing the tendon tear, the surgery often removes a portion of the bony roof of the shoulder (acromion) as well as the chronically thickened and inflamed membrane below the acromion (subacromial bursa). REASONS NOT TO OPERATE   Infection of the shoulder.  Inability to complete a rehabilitation program.  Patients who have other conditions (emotional or psychological) conditions that contribute to their shoulder condition. RISKS AND COMPLICATIONS  Infection.  Re-tear of the rotator cuff tendons or muscles.  Shoulder stiffness and/or weakness.  Inability to compete in athletics.  Acromioclavicular (AC) joint paint.  Risks of surgery: infection, bleeding, nerve damage, or damage to surrounding tissues. TECHNIQUE There are different surgical procedures used to treat rotator cuff tears. The type of procedure depends on the extent of injury as well as the surgeon's preference. All of the surgical techniques for rotator cuff tears have the same goal of repairing the torn tendon, removing part of the acromion, and removing the subacromial bursa. There are two main types of procedures: arthroscopic and open incision. Arthroscopic procedures are usually completed  and you go home the same day as surgery (outpatient). These procedures use multiple small incisions in which tools and a video camera are placed to work on the shoulder. An electric shaver removes the bursa, then a power burr shaves down the portion of the acromion that places pressure on the rotator cuff. Finally the rotator cuff is sewed (sutured) back to the humeral head. Open incision procedures require a larger incision. The deltoid muscle is detached from the acromion and a ligament in the shoulder (coracoacromial) is cut in order for the surgeon to access the rotator cuff. The subacromial bursa is removed as well as part of the acromion to give the rotator cuff room to move freely. The torn tendon is then sutured to the humeral head. After the rotator cuff is repaired, then the deltoid is reattached and the incision is closed up.  RECOVERY   Post-operative care depends on the surgical technique and the preferences of your therapist.  Keep the wound clean and dry for the first 10 to 14 days after surgery.  Keep your shoulder and arm in the sling provided to you for as Kayla Wilkinson as you have been instructed to.  You will be given pain medications by your caregiver.  Passive (without using muscles) shoulder movements may be begun immediately after surgery.  It is important to follow through with you rehabilitation program in order to have the best possible recovery. RETURN TO SPORTS   The rehabilitation period will depend on the sport and position you play as well as the success of the operation.  The minimum recovery period is 6 months.  You must have regained complete shoulder motion and strength before returning to sports. SEEK IMMEDIATE MEDICAL CARE IF:   Any medications produce adverse side effects.  Any complications from surgery  occur:  Pain, numbness, or coldness in the extremity operated upon.  Discoloration of the nail beds (they become blue or gray) of the extremity operated  upon.  Signs of infections (fever, pain, inflammation, redness, or persistent bleeding).

## 2013-09-30 ENCOUNTER — Encounter (HOSPITAL_COMMUNITY): Payer: Self-pay | Admitting: Pharmacy Technician

## 2013-09-30 ENCOUNTER — Other Ambulatory Visit: Payer: Self-pay | Admitting: *Deleted

## 2013-10-11 ENCOUNTER — Encounter: Payer: Self-pay | Admitting: Orthopedic Surgery

## 2013-10-11 ENCOUNTER — Telehealth: Payer: Self-pay | Admitting: Orthopedic Surgery

## 2013-10-11 NOTE — Telephone Encounter (Signed)
Per phone call to [facility] [phone], spoke with Justina H and no preauthorization is required for out patient  CPT [id number] Ref # Gailen Shelter 10/11/13

## 2013-10-12 NOTE — Patient Instructions (Signed)
Kayla Wilkinson  10/12/2013   Your procedure is scheduled on:   10/21/2013  Report to [facility] at  46  AM.  Call this number if you have problems the morning of surgery: [phone]   Remember:   Do not eat food or drink liquids after midnight.   Take these medicines the morning of surgery with A SIP OF WATER: hydrocodone, losartan, zantac   Do not wear jewelry, make-up or nail polish.  Do not wear lotions, powders, or perfumes.  Do not shave 48 hours prior to surgery. Men may shave face and neck.  Do not bring valuables to the hospital.  [facility] is not responsible for any belongings or valuables.               Contacts, dentures or bridgework may not be worn into surgery.  Leave suitcase in the car. After surgery it may be brought to your room.  For patients admitted to the hospital, discharge time is determined by your treatment team.               Patients discharged the day of surgery will not be allowed to drive home.  Name and phone number of your driver: family Special Instructions: Shower using CHG 2 nights before surgery and the night before surgery.  If you shower the day of surgery use CHG.  Use special wash - you have one bottle of CHG for all showers.  You should use approximately 1/3 of the bottle for each shower.   Please read over the following fact sheets that you were given: Pain Booklet, Coughing and Deep Breathing, Surgical Site Infection Prevention, Anesthesia Post-op Instructions and Care and Recovery After Surgery Shoulder Arthroscopy Because the shoulder is one of the most mobile joints, it is more prone to injury. It is a very shallow ball and socket joint located between the large bone in your upper arm (humerus) and the shoulder blade (scapula). Arthroscopy is a valuable test for evaluating and treating injuries involving the shoulder joint. Arthroscopy is a surgical technique which uses small incisions (cuts by the surgeon) to insert a small  telescope like instrument (arthroscope) and other tools into the shoulder. This allows the surgeon to look directly at the problem. When the arthroscope is in the joint, fluid is used to expand the joint space. This allows the surgeon to examine it more easily. The arthroscope then beams light into the joint and sends an image to a TV screen. As your surgeon examines your shoulder, he or she can also repair a number of problems found at the same time. Sometimes the procedure may change to an open surgery. This would happen if the problems are severe enough that they cannot be corrected with just arthroscopy. This is usually a very safe surgery. Rare complications include damage to nerves or blood vessels, excess bleeding, blood clots, infection, and rarely instrument failure. This is most often performed as a same day surgery. This means you will not have to stay in the hospital overnight. Recovery from this surgery is also much faster than having an open procedure. LET YOUR CAREGIVER KNOW ABOUT:  Allergies.  Medications taken including herbs, eye drops, over the counter medications, and creams.  Use of steroids (by mouth or creams).  Previous problems with anesthetics or novocaine.  Possibility of pregnancy, if this applies.  History of blood clots (thrombophlebitis).  History of bleeding or blood problems.  Previous surgery.  Other health problems.  Family history of anesthetic problems. BEFORE THE PROCEDURE   Stop all anti-inflammatory medications at least one week before surgery unless instructed otherwise. Tell your surgeon if you have been taking cortisone or other steroids.  Do not eat or drink after midnight or as instructed. Take medications as directed by your caregiver. You may have lab tests the morning of surgery.  You should be present 60 minutes prior to your procedure or as directed. PROCEDURE  You may have general (go to sleep) or local (numb the area) anesthetic. Your  surgeon will discuss this with you. During the procedure as discussed above, your surgeon may find a variety of problems which he or she can improve or correct using small instruments. When the procedure is finished the tiny incisions will be closed with stitches or tape. AFTER YOUR PROCEDURE  After surgery you will be taken to the recovery area. A nurse will watch and check your progress. Once you are awake, stable, and taking fluids well, barring other problems you will be allowed to go home.  Once home, apply an ice pack to your operative site for twenty minutes, three to four times per day, for two to three days. This may help with discomfort and keep the swelling down.  Use a sling and medications if prescribed or as instructed.  Unless your caregiver advises otherwise, move your arm and shoulder gently and frequently following the procedure. This can help prevent stiffness and swelling. REHABILITATION  Almost as important as your surgery is your rehabilitation. If physical therapy and exercises are prescribed by your surgeon, follow them diligently. Once comfortable and on your way to full use, do muscle strengthening exercises as instructed.  Only take over-the-counter or prescription medicines for pain, discomfort, or fever as directed by your caregiver. SEEK IMMEDIATE MEDICAL CARE IF:   There is redness, swelling, or increasing pain in the wound or joint.  You notice purulent (colored- pus-like) drainage coming from the wound.  An unexplained oral temperature above 102 F (38.9 C) develops.  You notice a foul smell coming from the wound or dressing.  There is a breaking open of the wound. The edges do not stay together after sutures or tape has been removed.  Persistent bleeding from the small incision. Document Released: 06/20/2000 Document Revised: 09/15/2011 Document Reviewed: 10/09/2008 [facility] Patient Information 2014 [place]. PATIENT  INSTRUCTIONS POST-ANESTHESIA  IMMEDIATELY FOLLOWING SURGERY:  Do not drive or operate machinery for the first twenty four hours after surgery.  Do not make any important decisions for twenty four hours after surgery or while taking narcotic pain medications or sedatives.  If you develop intractable nausea and vomiting or a severe headache please notify your doctor immediately.  FOLLOW-UP:  Please make an appointment with your surgeon as instructed. You do not need to follow up with anesthesia unless specifically instructed to do so.  WOUND CARE INSTRUCTIONS (if applicable):  Keep a dry clean dressing on the anesthesia/puncture wound site if there is drainage.  Once the wound has quit draining you may leave it open to air.  Generally you should leave the bandage intact for twenty four hours unless there is drainage.  If the epidural site drains for more than 36-48 hours please call the anesthesia department.  QUESTIONS?:  Please feel free to call your physician or the hospital operator if you have any questions, and they will be happy to assist you.

## 2013-10-13 ENCOUNTER — Telehealth: Payer: Self-pay | Admitting: Orthopedic Surgery

## 2013-10-13 ENCOUNTER — Encounter (HOSPITAL_COMMUNITY)
Admission: RE | Admit: 2013-10-13 | Discharge: 2013-10-13 | Disposition: A | Discharge status: Home / Self Care | Payer: BC Managed Care – PPO | Source: Ambulatory Visit | Attending: Orthopedic Surgery | Admitting: Orthopedic Surgery

## 2013-10-13 NOTE — Telephone Encounter (Signed)
Patient had cancelled her pre-op appointment at [facility], then has stated, per phone with patient today, 10/13/13, that she will need to cancel her surgery, which was scheduled for 10/21/13, until further notice, due to a personal issue.    I have called back to [facility], to notify, as nurse is out of office today through tomorrow.

## 2013-10-17 NOTE — Telephone Encounter (Signed)
Noted and took off the surgery book.

## 2013-10-21 ENCOUNTER — Inpatient Hospital Stay (HOSPITAL_COMMUNITY)
Admission: RE | Admit: 2013-10-21 | Payer: BC Managed Care – PPO | Source: Ambulatory Visit | Admitting: Orthopedic Surgery

## 2013-10-21 ENCOUNTER — Encounter (HOSPITAL_COMMUNITY): Admission: RE | Payer: Self-pay | Source: Ambulatory Visit

## 2013-10-21 SURGERY — ARTHROSCOPY, SHOULDER, WITH ROTATOR CUFF REPAIR
Anesthesia: General | Laterality: Left

## 2013-10-24 ENCOUNTER — Ambulatory Visit: Payer: BC Managed Care – PPO | Admitting: Orthopedic Surgery

## 2014-02-08 ENCOUNTER — Other Ambulatory Visit (HOSPITAL_COMMUNITY): Payer: Self-pay | Admitting: Nurse Practitioner

## 2014-02-08 DIAGNOSIS — R109 Unspecified abdominal pain: Secondary | ICD-10-CM

## 2014-02-09 ENCOUNTER — Ambulatory Visit (HOSPITAL_COMMUNITY): Payer: BC Managed Care – PPO

## 2014-03-08 ENCOUNTER — Ambulatory Visit (HOSPITAL_COMMUNITY)
Admission: RE | Admit: 2014-03-08 | Discharge: 2014-03-08 | Disposition: A | Discharge status: Home / Self Care | Payer: BC Managed Care – PPO | Source: Ambulatory Visit | Attending: Nurse Practitioner | Admitting: Nurse Practitioner

## 2014-03-08 ENCOUNTER — Other Ambulatory Visit (HOSPITAL_COMMUNITY): Payer: Self-pay | Admitting: Nurse Practitioner

## 2014-03-08 DIAGNOSIS — R109 Unspecified abdominal pain: Secondary | ICD-10-CM | POA: Insufficient documentation

## 2014-03-08 DIAGNOSIS — Z1231 Encounter for screening mammogram for malignant neoplasm of breast: Secondary | ICD-10-CM

## 2014-03-20 ENCOUNTER — Ambulatory Visit (HOSPITAL_COMMUNITY): Payer: BC Managed Care – PPO

## 2014-05-01 ENCOUNTER — Encounter: Payer: Self-pay | Admitting: *Deleted

## 2014-05-08 ENCOUNTER — Encounter: Payer: Self-pay | Admitting: *Deleted

## 2014-05-10 ENCOUNTER — Encounter: Payer: Self-pay | Admitting: Adult Health

## 2014-05-22 ENCOUNTER — Encounter: Payer: Self-pay | Admitting: Women's Health

## 2014-10-10 ENCOUNTER — Encounter: Payer: Self-pay | Admitting: *Deleted

## 2014-10-17 ENCOUNTER — Ambulatory Visit (INDEPENDENT_AMBULATORY_CARE_PROVIDER_SITE_OTHER): Payer: BLUE CROSS/BLUE SHIELD | Admitting: Obstetrics & Gynecology

## 2014-10-17 ENCOUNTER — Other Ambulatory Visit (HOSPITAL_COMMUNITY)
Admission: RE | Admit: 2014-10-17 | Discharge: 2014-10-17 | Disposition: A | Discharge status: Home / Self Care | Payer: BLUE CROSS/BLUE SHIELD | Source: Ambulatory Visit | Attending: Obstetrics & Gynecology | Admitting: Obstetrics & Gynecology

## 2014-10-17 ENCOUNTER — Encounter: Payer: Self-pay | Admitting: Obstetrics & Gynecology

## 2014-10-17 VITALS — BP 110/80 | HR 76 | Ht 62.4 in | Wt 216.0 lb

## 2014-10-17 DIAGNOSIS — Z01419 Encounter for gynecological examination (general) (routine) without abnormal findings: Secondary | ICD-10-CM

## 2014-10-17 DIAGNOSIS — Z1151 Encounter for screening for human papillomavirus (HPV): Secondary | ICD-10-CM | POA: Insufficient documentation

## 2014-10-17 NOTE — Progress Notes (Signed)
Patient ID: CHERA SLIVKA, female   DOB: [birth year], 44 y.o.   MRN: [id number] Subjective:     Kayla Wilkinson is a 44 y.o. female here for a routine exam.  Patient's last menstrual period was 10/03/2014. G3P2010 Birth Control Method:  none Menstrual Calendar(currently): regular  Current complaints: none, wants to get pregnant.   Current acute medical issues:  none   Recent Gynecologic History Patient's last menstrual period was 10/03/2014. Last Pap: 2015,  normal Last mammogram: ,    Past Medical History  Diagnosis Date  . Diabetes mellitus   . Acid reflux   . Hypertension     History reviewed. No pertinent past surgical history.  OB History    Gravida Para Term Preterm AB TAB SAB Ectopic Multiple Living   3 2 2  1  1          History   Social History  . Marital Status: Single    Spouse Name: N/A  . Number of Children: N/A  . Years of Education: N/A   Social History Main Topics  . Smoking status: Former Smoker -- 0.25 packs/day for 1.5 years    Types: Cigarettes  . Smokeless tobacco: Never Used  . Alcohol Use: Yes     Comment: occassional  . Drug Use: No  . Sexual Activity: Yes    Birth Control/ Protection: None   Other Topics Concern  . None   Social History Narrative    Family History  Problem Relation Age of Onset  . Diabetes Mother   . Diabetes Maternal Grandmother   . Cancer Other   . Cancer - Cervical Maternal Aunt      Current outpatient prescriptions:  .  aspirin EC 81 MG tablet, Take 81 mg by mouth daily., Disp: , Rfl:  .  glipiZIDE (GLUCOTROL) 10 MG tablet, Take 10 mg by mouth 2 (two) times daily., Disp: , Rfl:  .  losartan-hydrochlorothiazide (HYZAAR) 100-12.5 MG per tablet, Take 1 tablet by mouth daily., Disp: , Rfl:  .  metFORMIN (GLUCOPHAGE) 500 MG tablet, Take 500 mg by mouth 2 (two) times daily., Disp: , Rfl:  .  phentermine 37.5 MG capsule, Take 37.5 mg by mouth every morning., Disp: , Rfl:  .  pravastatin (PRAVACHOL) 40 MG tablet,  Take 40 mg by mouth at bedtime., Disp: , Rfl:  .  Prenatal Vit-Fe Fumarate-FA (MULTIVITAMIN-PRENATAL) 27-0.8 MG TABS tablet, Take 1 tablet by mouth daily at 12 noon., Disp: , Rfl:  .  ranitidine (ZANTAC) 300 MG capsule, Take 300 mg by mouth 2 (two) times daily.  , Disp: , Rfl:  .  HYDROcodone-acetaminophen (NORCO/VICODIN) 5-325 MG per tablet, Take 1 tablet by mouth every 6 (six) hours as needed for moderate pain. (Patient not taking: Reported on 10/17/2014), Disp: 40 tablet, Rfl: 0 .  omega-3 acid ethyl esters (LOVAZA) 1 G capsule, Take 1 g by mouth daily., Disp: , Rfl:   Review of Systems  Review of Systems  Constitutional: Negative for fever, chills, weight loss, malaise/fatigue and diaphoresis.  HENT: Negative for hearing loss, ear pain, nosebleeds, congestion, sore throat, neck pain, tinnitus and ear discharge.   Eyes: Negative for blurred vision, double vision, photophobia, pain, discharge and redness.  Respiratory: Negative for cough, hemoptysis, sputum production, shortness of breath, wheezing and stridor.   Cardiovascular: Negative for chest pain, palpitations, orthopnea, claudication, leg swelling and PND.  Gastrointestinal: negative for abdominal pain. Negative for heartburn, nausea, vomiting, diarrhea, constipation, blood in stool and melena.  Genitourinary:  Negative for dysuria, urgency, frequency, hematuria and flank pain.  Musculoskeletal: Negative for myalgias, back pain, joint pain and falls.  Skin: Negative for itching and rash.  Neurological: Negative for dizziness, tingling, tremors, sensory change, speech change, focal weakness, seizures, loss of consciousness, weakness and headaches.  Endo/Heme/Allergies: Negative for environmental allergies and polydipsia. Does not bruise/bleed easily.  Psychiatric/Behavioral: Negative for depression, suicidal ideas, hallucinations, memory loss and substance abuse. The patient is not nervous/anxious and does not have insomnia.         Objective:  Blood pressure 110/80, pulse 76, height 5' 2.4" (1.585 m), weight 216 lb (97.977 kg), last menstrual period 10/03/2014.   Physical Exam  Vitals reviewed. Constitutional: She is oriented to person, place, and time. She appears well-developed and well-nourished.  HENT:  Head: Normocephalic and atraumatic.        Right Ear: External ear normal.  Left Ear: External ear normal.  Nose: Nose normal.  Mouth/Throat: Oropharynx is clear and moist.  Eyes: Conjunctivae and EOM are normal. Pupils are equal, round, and reactive to light. Right eye exhibits no discharge. Left eye exhibits no discharge. No scleral icterus.  Neck: Normal range of motion. Neck supple. No tracheal deviation present. No thyromegaly present.  Cardiovascular: Normal rate, regular rhythm, normal heart sounds and intact distal pulses.  Exam reveals no gallop and no friction rub.   No murmur heard. Respiratory: Effort normal and breath sounds normal. No respiratory distress. She has no wheezes. She has no rales. She exhibits no tenderness.  GI: Soft. Bowel sounds are normal. She exhibits no distension and no mass. There is no tenderness. There is no rebound and no guarding.  Genitourinary:  Breasts no masses skin changes or nipple changes bilaterally      Vulva is normal without lesions Vagina is pink moist without discharge Cervix normal in appearance and pap is done Uterus is normal size shape and contour Adnexa is negative with normal sized ovaries   Musculoskeletal: Normal range of motion. She exhibits no edema and no tenderness.  Neurological: She is alert and oriented to person, place, and time. She has normal reflexes. She displays normal reflexes. No cranial nerve deficit. She exhibits normal muscle tone. Coordination normal.  Skin: Skin is warm and dry. No rash noted. No erythema. No pallor.  Psychiatric: She has a normal mood and affect. Her behavior is normal. Judgment and thought content normal.        Assessment:    Healthy female exam.    Plan:    Follow up in: 1 year.

## 2014-10-19 LAB — CYTOLOGY - PAP

## 2015-01-10 ENCOUNTER — Other Ambulatory Visit (HOSPITAL_COMMUNITY): Payer: Self-pay | Admitting: Physician Assistant

## 2015-01-10 ENCOUNTER — Ambulatory Visit (HOSPITAL_COMMUNITY)
Admission: RE | Admit: 2015-01-10 | Discharge: 2015-01-10 | Disposition: A | Discharge status: Home / Self Care | Payer: BLUE CROSS/BLUE SHIELD | Source: Ambulatory Visit | Attending: Physician Assistant | Admitting: Physician Assistant

## 2015-01-10 DIAGNOSIS — R079 Chest pain, unspecified: Secondary | ICD-10-CM | POA: Diagnosis present

## 2015-01-10 DIAGNOSIS — R0602 Shortness of breath: Secondary | ICD-10-CM | POA: Diagnosis not present

## 2015-01-10 DIAGNOSIS — R1013 Epigastric pain: Secondary | ICD-10-CM

## 2015-01-10 DIAGNOSIS — Z87891 Personal history of nicotine dependence: Secondary | ICD-10-CM | POA: Diagnosis not present

## 2015-01-12 ENCOUNTER — Ambulatory Visit (HOSPITAL_COMMUNITY)
Admission: RE | Admit: 2015-01-12 | Discharge: 2015-01-12 | Disposition: A | Discharge status: Home / Self Care | Payer: BLUE CROSS/BLUE SHIELD | Source: Ambulatory Visit | Attending: Physician Assistant | Admitting: Physician Assistant

## 2015-01-12 DIAGNOSIS — R079 Chest pain, unspecified: Secondary | ICD-10-CM

## 2015-01-12 DIAGNOSIS — R932 Abnormal findings on diagnostic imaging of liver and biliary tract: Secondary | ICD-10-CM | POA: Diagnosis not present

## 2015-01-12 DIAGNOSIS — R1013 Epigastric pain: Secondary | ICD-10-CM | POA: Insufficient documentation

## 2015-07-24 ENCOUNTER — Encounter: Payer: Self-pay | Admitting: Obstetrics & Gynecology

## 2015-07-24 ENCOUNTER — Ambulatory Visit (INDEPENDENT_AMBULATORY_CARE_PROVIDER_SITE_OTHER): Payer: BLUE CROSS/BLUE SHIELD | Admitting: Obstetrics & Gynecology

## 2015-07-24 VITALS — BP 130/80 | HR 74 | Wt 207.0 lb

## 2015-07-24 DIAGNOSIS — Z3202 Encounter for pregnancy test, result negative: Secondary | ICD-10-CM | POA: Diagnosis not present

## 2015-07-24 DIAGNOSIS — N643 Galactorrhea not associated with childbirth: Secondary | ICD-10-CM

## 2015-07-24 DIAGNOSIS — O926 Galactorrhea: Secondary | ICD-10-CM

## 2015-07-24 NOTE — Progress Notes (Signed)
Patient ID: Kayla Wilkinson, female   DOB: 15-Aug-1970, 45 y.o.   MRN: [id number]      Chief Complaint  Patient presents with  . gyn visit    left breast leaking milk.    Blood pressure 130/80, pulse 74, weight 207 lb (93.895 kg), last menstrual period 06/02/2015.  45 y.o. G3P2010 Patient's last menstrual period was 06/02/2015. The current method of family planning is barrier.  Subjective New onset of milky left breast discharge No blood no pain Partner gives lots of breast stim  Objective Breast no masses or spontaneous discharge +stripping discharge Non tender  Pertinent ROS No burning with urination, frequency or urgency No nausea, vomiting or diarrhea Nor fever chills or other constitutional symptoms   Labs or studies Microscope +fat some macrophages    Impression Diagnoses this Encounter::   ICD-9-CM ICD-10-CM   1. Galactorrhea 611.6 O92.6 Prolactin     TSH  2. Negative pregnancy test V72.41 Z32.02 POCT urine pregnancy    Established relevant diagnosis(es):   Plan/Recommendations: Meds ordered this encounter  Medications  . omeprazole (PRILOSEC) 20 MG capsule    Sig: Take 20 mg by mouth daily.  Marland Kitchen amoxicillin (AMOXIL) 875 MG tablet    Sig: Take 875 mg by mouth 2 (two) times daily.  . Codeine Polt-Chlorphen Polt ER (TUZISTRA XR) 14.7-2.8 MG/5ML SUER    Sig: Take by mouth.    Labs or Scans Ordered: Orders Placed This Encounter  Procedures  . Prolactin  . TSH  . POCT urine pregnancy    Management:: Do lab eval for galactorrhea, schedule mammogram  Follow up Prn Will call if labs or mammogram abnormal          All questions were answered.

## 2015-07-25 LAB — TSH: TSH: 1.19 u[IU]/mL (ref 0.450–4.500)

## 2015-07-25 LAB — PROLACTIN: Prolactin: 12.8 ng/mL (ref 4.8–23.3)

## 2015-07-26 ENCOUNTER — Telehealth: Payer: Self-pay | Admitting: Obstetrics and Gynecology

## 2015-07-26 ENCOUNTER — Telehealth: Payer: Self-pay | Admitting: Obstetrics & Gynecology

## 2015-07-26 NOTE — Telephone Encounter (Signed)
Pt informed of WNL results for TSH and Prolactin from 07/24/2015. Please advise if f/u is needed.

## 2015-07-26 NOTE — Telephone Encounter (Signed)
Nope no other follow up is needed

## 2015-10-31 ENCOUNTER — Encounter: Payer: Self-pay | Admitting: Advanced Practice Midwife

## 2015-10-31 ENCOUNTER — Ambulatory Visit (INDEPENDENT_AMBULATORY_CARE_PROVIDER_SITE_OTHER): Payer: BLUE CROSS/BLUE SHIELD | Admitting: Advanced Practice Midwife

## 2015-10-31 ENCOUNTER — Other Ambulatory Visit: Payer: Self-pay | Admitting: Advanced Practice Midwife

## 2015-10-31 VITALS — BP 122/70 | Ht 63.0 in | Wt 208.0 lb

## 2015-10-31 DIAGNOSIS — R319 Hematuria, unspecified: Secondary | ICD-10-CM

## 2015-10-31 DIAGNOSIS — Z113 Encounter for screening for infections with a predominantly sexual mode of transmission: Secondary | ICD-10-CM | POA: Diagnosis not present

## 2015-10-31 DIAGNOSIS — R1030 Lower abdominal pain, unspecified: Secondary | ICD-10-CM | POA: Diagnosis not present

## 2015-10-31 MED ORDER — CIPROFLOXACIN HCL 500 MG PO TABS: 500.0000 mg | ORAL_TABLET | Freq: Two times a day (BID) | ORAL | Status: DC

## 2015-10-31 NOTE — Progress Notes (Signed)
   [facility] Visit  Patient name: Kayla Wilkinson MRN [id number]  Date of birth: 05/03/1971  CC & HPI:  Kayla Wilkinson is a 45 y.o. Hispanic female presenting today for C/O lower abdominal pain for a few days, dysuria started today.  Wants STD screening--partner travels a lot, she suspects/fears he cheats.  Thinks abdominal pain could be ovulation pain, but thinks it could also be a UTI because "it feels funny when I pee.".   Pertinent History Reviewed:  Medical & Surgical Hx:   Past Medical History  Diagnosis Date  . Diabetes mellitus   . Acid reflux   . Hypertension    History reviewed. No pertinent past surgical history. Family History  Problem Relation Age of Onset  . Diabetes Mother   . Diabetes Maternal Grandmother   . Cancer Other   . Cancer - Cervical Maternal Aunt   . Diabetes Son     Current outpatient prescriptions:  .  aspirin EC 81 MG tablet, Take 81 mg by mouth daily., Disp: , Rfl:  .  glipiZIDE (GLUCOTROL) 10 MG tablet, Take 10 mg by mouth 2 (two) times daily., Disp: , Rfl:  .  losartan-hydrochlorothiazide (HYZAAR) 100-12.5 MG per tablet, Take 1 tablet by mouth daily., Disp: , Rfl:  .  metFORMIN (GLUCOPHAGE) 500 MG tablet, Take 500 mg by mouth 2 (two) times daily., Disp: , Rfl:  .  ranitidine (ZANTAC) 300 MG capsule, Take 300 mg by mouth 2 (two) times daily.  , Disp: , Rfl:  .  ciprofloxacin (CIPRO) 500 MG tablet, Take 1 tablet (500 mg total) by mouth 2 (two) times daily., Disp: 6 tablet, Rfl: 0 .  pravastatin (PRAVACHOL) 40 MG tablet, Take 40 mg by mouth at bedtime. Reported on 10/31/2015, Disp: , Rfl:  Social History: Reviewed -  reports that she has quit smoking. Her smoking use included Cigarettes. She has a .375 pack-year smoking history. She has never used smokeless tobacco.  Review of Systems:   Constitutional: Negative for fever and chills Eyes: Negative for visual disturbances Respiratory: Negative for shortness of breath,  dyspnea Cardiovascular: Negative for chest pain or palpitations  Gastrointestinal: Negative for vomiting, diarrhea and constipation;  Genitourinary: Negative  vaginal irritation or itching Musculoskeletal: Negative for back pain, joint pain, myalgias  Neurological: Negative for dizziness and headaches    Objective Findings:    Physical Examination: General appearance - well appearing, and in no distress Mental status - alert, oriented to person, place, and time Chest:  Normal respiratory effort Heart - normal rate and regular rhythm Abdomen:  Soft, nontender.  Non specific "discomfort" across lower abdomen w/deep palpation Pelvic: SSE:  Discharge appears normal, no odor.  Wet prep negative.   Musculoskeletal:  Normal range of motion without pain Extremities:  No edema    No results found for this or any previous visit (from the past 24 hour(s)).    Assessment & Plan:  A:   UTI vs [place]  STD screening P:  Will rx cipro d/t dysuria and blood/leuks in urine  Orders Placed This Encounter  Procedures  . GC/Chlamydia Probe Amp  . Urine culture  . HIV antibody  . RPR  . POCT urinalysis dipstick      Return if symptoms worsen or fail to improve.  CRESENZO-DISHMAN,Rodolfo Notaro CNM 11/01/2015 10:37 PM

## 2015-11-01 LAB — HIV ANTIBODY (ROUTINE TESTING W REFLEX): HIV Screen 4th Generation wRfx: NONREACTIVE

## 2015-11-01 LAB — RPR: RPR: NONREACTIVE

## 2015-11-02 LAB — URINE CULTURE

## 2015-11-03 LAB — GC/CHLAMYDIA PROBE AMP
Chlamydia trachomatis, NAA: NEGATIVE
Neisseria gonorrhoeae by PCR: NEGATIVE

## 2015-11-03 LAB — TRICHOMONAS VAGINALIS, PROBE AMP: Trich vag by NAA: NEGATIVE

## 2016-03-21 ENCOUNTER — Telehealth: Payer: Self-pay | Admitting: *Deleted

## 2016-03-21 NOTE — Telephone Encounter (Signed)
Pt informed per Derrek Monaco, NP will need to be seen before treatment.

## 2016-03-24 ENCOUNTER — Encounter: Payer: Self-pay | Admitting: Women's Health

## 2016-03-24 ENCOUNTER — Ambulatory Visit (INDEPENDENT_AMBULATORY_CARE_PROVIDER_SITE_OTHER): Payer: BLUE CROSS/BLUE SHIELD | Admitting: Women's Health

## 2016-03-24 VITALS — BP 166/98 | HR 96 | Wt 213.0 lb

## 2016-03-24 DIAGNOSIS — I10 Essential (primary) hypertension: Secondary | ICD-10-CM

## 2016-03-24 DIAGNOSIS — N9089 Other specified noninflammatory disorders of vulva and perineum: Secondary | ICD-10-CM

## 2016-03-24 DIAGNOSIS — N816 Rectocele: Secondary | ICD-10-CM | POA: Diagnosis not present

## 2016-03-24 MED ORDER — VALACYCLOVIR HCL 1 G PO TABS: 1000.0000 mg | ORAL_TABLET | Freq: Two times a day (BID) | ORAL | 11 refills | Status: DC

## 2016-03-24 NOTE — Progress Notes (Signed)
   [facility] Visit  Patient name: Kayla Wilkinson MRN [id number]  Date of birth: 1971/06/13  CC & HPI:  Kayla Wilkinson is a 45 y.o. G21P2010 Hispanic female presenting today for report of vulvar itching/irritation/burning since 9/6. No abnormal d/c or odor. Happened before in April, but sores had gone away by time she was seen. Was neg for gc/ct, trich, hiv, rpr at time. divorced from 1st husband who cheated on her w/ woman who had HSV. Does have HTN, has not taken medicine today. Works 3rd shift, has been trying to figure out best time to take it.  Patient's last menstrual period was 03/13/2016.  Last pap 10/2014, neg w/ -HRHPV  Pertinent History Reviewed:  Medical & Surgical Hx:   Past medical, surgical, family, and social history reviewed in electronic medical record Medications: Reviewed & Updated - see associated section Allergies: Reviewed in electronic medical record  Objective Findings:  Vitals: BP (!) 166/98 (BP Location: Left Arm, Patient Position: Sitting, Cuff Size: Normal)   Pulse 96   Wt 213 lb (96.6 kg)   LMP 03/13/2016   BMI 37.73 kg/m  Body mass index is 37.73 kg/m.  Physical Examination: General appearance - alert, well appearing, and in no distress Pelvic - multiple painful lesions on bilateral labia minora c/w HSV, cultured, spec exam: cx/ vaginal normal, scant normal d/c Rectal - large rectocele noted- pt denies any constipation, problems w/ passing stool, etc  No results found for this or any previous visit (from the past 24 hour(s)).   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  Tawnya Crook CNM, [facility] 03/24/2016 3:01 PM

## 2016-03-24 NOTE — Patient Instructions (Signed)
Genital Herpes Genital herpes is a common sexually transmitted infection (STI) that is caused by a virus. The virus is spread from person to person through sexual contact. Infection can cause itching, blisters, and sores in the genital area or rectal area. This is called an outbreak. It affects both men and women. Genital herpes is particularly concerning for pregnant women because the virus can be passed to the baby during delivery and cause serious problems. Genital herpes is also a concern for people with a weakened defense (immune) system. Symptoms of genital herpes may last several days and then go away. However, the virus remains in your body, so you may have more outbreaks of symptoms in the future. The time between outbreaks varies and can be months or years. CAUSES Genital herpes is caused by a virus called herpes simplex virus (HSV) type 2 or HSV type 1. These viruses are contagious and are most often spread through sexual contact with an infected person. Sexual contact includes vaginal, anal, and oral sex. RISK FACTORS Risk factors for genital herpes include:  Being sexually active with multiple partners.  Having unprotected sex. SIGNS AND SYMPTOMS Symptoms may include:  Pain and itching in the genital area or rectal area.  Small red bumps that turn into blisters and then turn into sores.  Flu-like symptoms, including:  Fever.  Body aches.  Painful urination.  Vaginal discharge. DIAGNOSIS Genital herpes may be diagnosed by:  Physical exam.  Blood test.  Fluid culture test from an open sore. TREATMENT There is no cure for genital herpes. Oral antiviral medicines may be used to speed up healing and to help prevent the return of symptoms. These medicines can also help to reduce the spread of the virus to sexual partners. HOME CARE INSTRUCTIONS  Keep the affected areas dry and clean.  Take medicines only as directed by your health care provider.  Do not have sexual  contact during active infections. Genital herpes is contagious.  Practice safe sex. Latex condoms and female condoms may help to prevent the spread of the herpes virus.  Avoid rubbing or touching the blisters and sores. If you do touch the blister or sores:  Wash your hands thoroughly.  Do not touch your eyes afterward.  If you become pregnant, tell your health care provider if you have had genital herpes.  Keep all follow-up visits as directed by your health care provider. This is important. PREVENTION  Use condoms. Although anyone can contract genital herpes during sexual contact even with the use of a condom, a condom can provide some protection.  Avoid having multiple sexual partners.  Talk to your sexual partner about any symptoms and past history that either of you may have.  Get tested before you have sex. Ask your partner to do the same.  Recognize the symptoms of genital herpes. Do not have sexual contact if you notice these symptoms. SEEK MEDICAL CARE IF:  Your symptoms are not improving with medicine.  Your symptoms return.  You have new symptoms.  You have a fever.  You have abdominal pain.  You have redness, swelling, or pain in your eye. MAKE SURE YOU:  Understand these instructions.  Will watch your condition.  Will get help right away if you are not doing well or get worse.   This information is not intended to replace advice given to you by your health care provider. Make sure you discuss any questions you have with your health care provider.   Document Released: 06/20/2000   Document Revised: 07/14/2014 Document Reviewed: 11/08/2013 Elsevier Interactive Patient Education 2016 [employer].  About Rectocele  Overview  A rectocele is a type of hernia which causes different degrees of bulging of the rectal tissues into the vaginal wall.  You may even notice that it presses against the vaginal wall so much that some vaginal tissues droop outside of  the opening of your vagina.  Causes of Rectocele  The most common cause is childbirth.  The muscles and ligaments in the pelvis that hold up and support the female organs and vagina become stretched and weakened during labor and delivery.  The more babies you have, the more the support tissues are stretched and weakened.  Not everyone who has a baby will develop a rectocele.  Some women have stronger supporting tissue in the pelvis and may not have as much of a problem as others.  Women who have a Cesarean section usually do not get rectocele's unless they pushed a long time prior to the cesarean delivery.  Other conditions that can cause a rectocele include chronic constipation, a chronic cough, a lot of heavy lifting, and obesity.  Older women may have this problem because the loss of female hormones causes the vaginal tissue to become weaker.  Symptoms  There may not be any symptoms.  If you do have symptoms, they may include:  Pelvic pressure in the rectal area  Protrusion of the lower part of the vagina through the opening of the vagina  Constipation and trapping of the stool, making it difficult to have a bowel movement.  In severe cases, you may have to press on the lower part of your vagina to help push the stool out of you rectum.  This is called splinting to empty.  Diagnosing Rectocele  Your health care provider will ask about your symptoms and perform a pelvic exam.  S/he will ask you to bear down, pushing like you are having a bowel movement so as to see how far the lower part of the vagina protrudes into the vagina and possible outside of the vagina.  Your provider will also ask you to contract the muscles of your pelvis (like you are stopping the stream in the middle of urinating) to determine the strength of your pelvic muscles.  Your provider may also do a rectal exam.  Treatment Options  If you do not have any symptoms, no treatment may be necessary.  Other treatment options  include:  Pelvic floor exercises: Contracting the muscles in your genital area may help strengthen your muscles and support the organs.  Be sure to get proper exercise instruction from you physical therapist.  A pessary (removealbe pelvic support device) sometimes helps rectocele symptoms.  Surgery: Surgical repair may be necessary. In some cases the uterus may need to be taken out ( a hysterectomy) as well.  There are many types of surgery for pelvic support problems.  Look for physicians who specialize in repair procedures.  You can take care of yourself by:  Treating and preventing constipation  Avoiding heavy lifting, and lifting correctly (with your legs, not with you waist or back)  Treating a chronic cough or bronchitis  Not smoking  avoiding too much weight gain  Doing pelvic floor exercises   2007, [employer] Doc.33

## 2016-03-29 LAB — HERPES SIMPLEX VIRUS CULTURE

## 2016-03-31 ENCOUNTER — Telehealth: Payer: Self-pay | Admitting: Women's Health

## 2016-03-31 ENCOUNTER — Encounter: Payer: Self-pay | Admitting: Women's Health

## 2016-03-31 DIAGNOSIS — A6000 Herpesviral infection of urogenital system, unspecified: Secondary | ICD-10-CM | POA: Insufficient documentation

## 2016-03-31 NOTE — Telephone Encounter (Signed)
LM for pt to return call, need to notify culture was +for herpes.  [name], CNM, [facility] 03/31/2016 12:59 PM

## 2016-03-31 NOTE — Telephone Encounter (Signed)
Pt returned call. Notified her culture was +for herpes. She wishes to continue daily suppression. Questions answered.  [name], CNM, [facility] 03/31/2016 1:39 PM

## 2016-04-03 ENCOUNTER — Ambulatory Visit (INDEPENDENT_AMBULATORY_CARE_PROVIDER_SITE_OTHER): Payer: BLUE CROSS/BLUE SHIELD | Admitting: Podiatry

## 2016-04-03 ENCOUNTER — Encounter: Payer: Self-pay | Admitting: Podiatry

## 2016-04-03 ENCOUNTER — Ambulatory Visit (INDEPENDENT_AMBULATORY_CARE_PROVIDER_SITE_OTHER): Payer: BLUE CROSS/BLUE SHIELD

## 2016-04-03 VITALS — BP 154/97 | HR 108 | Resp 16 | Ht 64.0 in | Wt 212.0 lb

## 2016-04-03 DIAGNOSIS — M722 Plantar fascial fibromatosis: Secondary | ICD-10-CM

## 2016-04-03 DIAGNOSIS — M25572 Pain in left ankle and joints of left foot: Secondary | ICD-10-CM

## 2016-04-03 DIAGNOSIS — M25571 Pain in right ankle and joints of right foot: Secondary | ICD-10-CM | POA: Diagnosis not present

## 2016-04-03 DIAGNOSIS — M79671 Pain in right foot: Secondary | ICD-10-CM

## 2016-04-03 MED ORDER — TRIAMCINOLONE ACETONIDE 10 MG/ML IJ SUSP
10.0000 mg | Freq: Once | INTRAMUSCULAR | Status: AC
  Administered 2016-04-03: 10 mg

## 2016-04-03 MED ORDER — DICLOFENAC SODIUM 75 MG PO TBEC: 75.0000 mg | DELAYED_RELEASE_TABLET | Freq: Two times a day (BID) | ORAL | 2 refills | Status: DC

## 2016-04-03 NOTE — Progress Notes (Signed)
   Subjective:    Patient ID: Kayla Wilkinson, female    DOB: Oct 19, 1970, 45 y.o.   MRN: [id number]  HPI  Chief Complaint  Patient presents with  . Foot Pain    R heel pain x 2 mo.  pt states "I just recently started a job where I'm on concrete on day."         Review of Systems  All other systems reviewed and are negative.      Objective:   Physical Exam        Assessment & Plan:

## 2016-04-03 NOTE — Progress Notes (Signed)
Subjective:     Patient ID: Kayla Wilkinson, female   DOB: [birth year], 45 y.o.   MRN: [id number]  HPI patient states she's had heel pain 2 months with exquisite discomfort in the medial band and admits that she's tried reduced activity and shoe gear modification   Review of Systems  All other systems reviewed and are negative.      Objective:   Physical Exam  Constitutional: She is oriented to person, place, and time.  Cardiovascular: Intact distal pulses.   Musculoskeletal: Normal range of motion.  Neurological: She is oriented to person, place, and time.  Skin: Skin is warm.  Nursing note and vitals reviewed.  neurovascular status intact muscle strength adequate range of motion within normal limits with patient found to have exquisite discomfort plantar aspect right heel at the insertional point of the tendon into the calcaneus with inflammation and fluid buildup noted patient is moderate depression of the arch noted also     Assessment:     Plantar fasciitis right with inflammation and fluid around the medial band and depression of the arch noted    Plan:     H&P and x-rays reviewed. Injected the plantar fascia right 3 mg Kenalog 5 mill grams Xylocaine and applied fascial brace placed on oral anti-inflammatory and instructed on physical therapy supportive shoes and not going barefoot. Reappoint in several weeks and discussed orthotics

## 2016-04-03 NOTE — Patient Instructions (Signed)

## 2016-04-07 ENCOUNTER — Telehealth: Payer: Self-pay | Admitting: Women's Health

## 2016-04-07 MED ORDER — METRONIDAZOLE 0.75 % VA GEL: 1.0000 | Freq: Every day | VAGINAL | 0 refills | Status: DC

## 2016-04-07 NOTE — Telephone Encounter (Signed)
Pt called, hsv lesions are gone w/ valacylovir- still has itching. Rx metrogel, no sex or etoh while taking. Call if not helping.  [name], CNM, [facility] 04/07/2016 10:35 AM

## 2016-04-10 ENCOUNTER — Telehealth: Payer: Self-pay | Admitting: *Deleted

## 2016-04-10 NOTE — Telephone Encounter (Signed)
Pt states she can't afford the cream prescribed. Can Rx for pill be sent instead?

## 2016-04-11 ENCOUNTER — Telehealth: Payer: Self-pay | Admitting: Women's Health

## 2016-04-11 MED ORDER — METRONIDAZOLE 500 MG PO TABS: 500.0000 mg | ORAL_TABLET | Freq: Two times a day (BID) | ORAL | 0 refills | Status: DC

## 2016-04-11 NOTE — Telephone Encounter (Signed)
Pt informed Metronidazole pill e-scribed no sex or etoh while taking. Pt verbalized understanding.

## 2016-05-02 ENCOUNTER — Telehealth: Payer: Self-pay | Admitting: Women's Health

## 2016-05-02 NOTE — Telephone Encounter (Signed)
Spoke with pt. Pt has vaginal irritation again. She was given Flagyl and finished it but 3 days after finishing it, irritation came back. Pt wants something ordered. I advised to try Monistat over the weekend and pt states that don't help. Please advise! Thanks!! [place]

## 2016-05-02 NOTE — Telephone Encounter (Signed)
Left message x 1. JSY 

## 2016-05-02 NOTE — Telephone Encounter (Signed)
Kayla Wilkinson pt returned your call can you please try her again

## 2016-05-05 ENCOUNTER — Telehealth: Payer: Self-pay | Admitting: Women's Health

## 2016-05-05 DIAGNOSIS — E1169 Type 2 diabetes mellitus with other specified complication: Secondary | ICD-10-CM

## 2016-05-05 DIAGNOSIS — E669 Obesity, unspecified: Principal | ICD-10-CM

## 2016-05-05 NOTE — Telephone Encounter (Signed)
Pt needs appt. Kayla Wilkinson to call pt and schedule an appt for pt. [place]

## 2016-05-07 ENCOUNTER — Ambulatory Visit: Payer: BLUE CROSS/BLUE SHIELD | Admitting: Women's Health

## 2016-05-09 ENCOUNTER — Ambulatory Visit (INDEPENDENT_AMBULATORY_CARE_PROVIDER_SITE_OTHER): Payer: BLUE CROSS/BLUE SHIELD | Admitting: Obstetrics and Gynecology

## 2016-05-09 ENCOUNTER — Encounter: Payer: Self-pay | Admitting: Obstetrics and Gynecology

## 2016-05-09 VITALS — BP 144/88 | Ht 64.0 in | Wt 212.4 lb

## 2016-05-09 DIAGNOSIS — N816 Rectocele: Secondary | ICD-10-CM | POA: Diagnosis not present

## 2016-05-09 DIAGNOSIS — N760 Acute vaginitis: Secondary | ICD-10-CM | POA: Diagnosis not present

## 2016-05-09 DIAGNOSIS — N898 Other specified noninflammatory disorders of vagina: Secondary | ICD-10-CM | POA: Diagnosis not present

## 2016-05-09 DIAGNOSIS — Z7189 Other specified counseling: Secondary | ICD-10-CM

## 2016-05-09 DIAGNOSIS — B373 Candidiasis of vulva and vagina: Secondary | ICD-10-CM | POA: Diagnosis not present

## 2016-05-09 DIAGNOSIS — B379 Candidiasis, unspecified: Secondary | ICD-10-CM | POA: Diagnosis not present

## 2016-05-09 DIAGNOSIS — A6000 Herpesviral infection of urogenital system, unspecified: Secondary | ICD-10-CM

## 2016-05-09 MED ORDER — FLUCONAZOLE 150 MG PO TABS: 150.0000 mg | ORAL_TABLET | Freq: Once | ORAL | 1 refills | Status: AC

## 2016-05-09 NOTE — Progress Notes (Signed)
[facility] Visit  05/09/2016          Patient name: Kayla Wilkinson MRN [id number]  Date of birth: Dec 05, 1970  CC & HPI:  Kayla Wilkinson is a 45 y.o. female presenting today for continued vaginal itching x ~2 months. She was HSV positive in September 2017; she couldn't afford the MetroGel and was instead treated with metronidazole orally and completed course. She notes the itching has been intermittent since onset. She has also been applying monistat with temporary relief.  Pt has a h/o DM but is not currently taking any medications and does not check her blood sugar.  See the 5 telephone calls since her last visit, not repeated here. ROS:  ROS Otherwise negative for acute change except as noted in the HPI. Diabetes, not checking sugars regularly, has the equipment. Pertinent History Reviewed:   Reviewed: Significant for DM Medical         Past Medical History:  Diagnosis Date  . Acid reflux   . Diabetes mellitus   . Hypertension                               Surgical Hx:   History reviewed. No pertinent surgical history. Medications: Reviewed & Updated - see associated section                       Current Outpatient Prescriptions:  .  acetaminophen (TYLENOL) 325 MG tablet, Take 650 mg by mouth every 6 (six) hours as needed., Disp: , Rfl:  .  aspirin EC 81 MG tablet, Take 81 mg by mouth daily., Disp: , Rfl:  .  glipiZIDE (GLUCOTROL) 10 MG tablet, Take 5 mg by mouth daily before breakfast. , Disp: , Rfl:  .  losartan-hydrochlorothiazide (HYZAAR) 100-12.5 MG per tablet, Take 1 tablet by mouth daily., Disp: , Rfl:  .  metFORMIN (GLUCOPHAGE) 500 MG tablet, Take 1,000 mg by mouth 2 (two) times daily. , Disp: , Rfl:  .  pravastatin (PRAVACHOL) 40 MG tablet, Take 40 mg by mouth at bedtime. Reported on 10/31/2015, Disp: , Rfl:  .  ranitidine (ZANTAC) 300 MG capsule, Take 300 mg by mouth 2 (two) times daily.  , Disp: , Rfl:  .  valACYclovir (VALTREX) 1000 MG  tablet, Take 1 tablet (1,000 mg total) by mouth 2 (two) times daily. X 10 days, then once daily thereafter, Disp: 30 tablet, Rfl: 11 .  diclofenac (VOLTAREN) 75 MG EC tablet, Take 1 tablet (75 mg total) by mouth 2 (two) times daily. (Patient not taking: Reported on 05/09/2016), Disp: 50 tablet, Rfl: 2 .  metroNIDAZOLE (FLAGYL) 500 MG tablet, Take 1 tablet (500 mg total) by mouth 2 (two) times daily. X 7 days. No sex or alcohol while taking (Patient not taking: Reported on 05/09/2016), Disp: 14 tablet, Rfl: 0 .  miconazole (MICOTIN) 2 % cream, Apply 1 application topically 2 (two) times daily., Disp: , Rfl:  .  Powders (VAGISIL EX), Apply topically., Disp: , Rfl:    Social History: Reviewed -  reports that she has quit smoking. Her smoking use included Cigarettes. She has a 0.37 pack-year smoking history. She has never used smokeless tobacco.  Objective Findings:  Vitals: Blood pressure (!) 144/88, height 5\' 4"  (1.626 m), weight 212 lb 6.4 oz (96.3 kg), last menstrual period 04/19/2016.  Physical Examination: General appearance - alert, well appearing, and in no distress Mental  status - alert, oriented to person, place, and time Pelvic -  VULVA: external genitalia irritated with yeast VAGINA: White plaque noted; KOH collected positive for yeast; Rectocele,  CERVIX: Present    Discussion: Discussed with pt weight loss methods including food measurement and calorie counting using apps such as MyNetDiary or My Fitness Pal to improve and maintain pt's blood sugar. Pt advised to measure blood sugar on a regular basis. Recommended the use of measuring cups and spoons to monitor serving sizes. Encouraged adequate daily water intake, especially prior to meals to eliminate overeating. Also encouraged pt to improve content of her diet. Additionally encouraged pt to become more active by taking daily walks of at least 30 minute duration, join a local gym such as the [facility] or attend water aerobics classes. Also  advised pt to use pedometer on smartphone or utilize a smartband such as FitBit to keep track of daily activity.   At end of discussion, pt had opportunity to ask questions and has no further questions at this time.   Specific discussion of lifestyle changes, behavioral modifications and healthy eating habits as noted above. Greater than 50% was spent in counseling and coordination of care with the patient.  Total time greater than: 30 minutes.   Assessment & Plan:   A:  1. Vaginitis  2. Uncontrolled diabetes mellitus  3. Rectocele   P:  1. Discharge with rx for Diflucan 2. Advised to continue Monistat 3. Pt instructed to follow up with [facility] and get Hemoglobin A1-C drawn. 4. Follow up in 2 weeks to discuss rectocele     By signing my name below, I, Evelene Croon, attest that this documentation has been prepared under the direction and in the presence of Jonnie Kind, MD . Electronically Signed: Evelene Croon, Scribe. 05/09/2016. 10:09 AM. I personally performed the services described in this documentation, which was SCRIBED in my presence. The recorded information has been reviewed and considered accurate. It has been edited as necessary during review. Jonnie Kind, MD

## 2016-05-10 LAB — COMPREHENSIVE METABOLIC PANEL
ALT: 74 IU/L — AB (ref 0–32)
AST: 28 IU/L (ref 0–40)
Albumin/Globulin Ratio: 1.4 (ref 1.2–2.2)
Albumin: 4.2 g/dL (ref 3.5–5.5)
Alkaline Phosphatase: 120 IU/L — ABNORMAL HIGH (ref 39–117)
BUN/Creatinine Ratio: 32 — ABNORMAL HIGH (ref 9–23)
BUN: 16 mg/dL (ref 6–24)
Bilirubin Total: <0.2 mg/dL (ref 0.0–1.2)
CALCIUM: 9.9 mg/dL (ref 8.7–10.2)
CO2: 24 mmol/L (ref 18–29)
CREATININE: 0.5 mg/dL — AB (ref 0.57–1.00)
Chloride: 93 mmol/L — ABNORMAL LOW (ref 96–106)
GFR calc Af Amer: 133 mL/min/{1.73_m2} (ref >59)
GFR, EST NON AFRICAN AMERICAN: 116 mL/min/{1.73_m2} (ref >59)
GLUCOSE: 276 mg/dL — AB (ref 65–99)
Globulin, Total: 3 g/dL (ref 1.5–4.5)
Potassium: 4.3 mmol/L (ref 3.5–5.2)
Sodium: 135 mmol/L (ref 134–144)
Total Protein: 7.2 g/dL (ref 6.0–8.5)

## 2016-05-10 LAB — HEMOGLOBIN A1C
ESTIMATED AVERAGE GLUCOSE: 312 mg/dL
HEMOGLOBIN A1C: 12.5 % — AB (ref 4.8–5.6)

## 2016-05-14 DIAGNOSIS — E669 Obesity, unspecified: Principal | ICD-10-CM | POA: Insufficient documentation

## 2016-05-14 DIAGNOSIS — E1169 Type 2 diabetes mellitus with other specified complication: Secondary | ICD-10-CM | POA: Insufficient documentation

## 2016-05-14 MED ORDER — METFORMIN HCL 500 MG PO TABS: 1000.0000 mg | ORAL_TABLET | Freq: Two times a day (BID) | ORAL | 3 refills | Status: DC

## 2016-05-14 MED ORDER — GLIPIZIDE 10 MG PO TABS: 5.0000 mg | ORAL_TABLET | Freq: Every day | ORAL | 2 refills | Status: DC

## 2016-05-14 NOTE — Telephone Encounter (Signed)
Phone call tp PT, left message that pt's diabetes is not in control, and she needs to restart her medications.  refills written for glypizide and metformin x 2 months. Has appt in 2 wks,  Pt to call for questions.

## 2016-05-19 ENCOUNTER — Telehealth: Payer: Self-pay | Admitting: Obstetrics and Gynecology

## 2016-05-22 NOTE — Telephone Encounter (Signed)
Unable to reach pt x3 attempts 

## 2016-05-23 ENCOUNTER — Encounter: Payer: Self-pay | Admitting: Obstetrics and Gynecology

## 2016-05-23 ENCOUNTER — Ambulatory Visit (INDEPENDENT_AMBULATORY_CARE_PROVIDER_SITE_OTHER): Payer: BLUE CROSS/BLUE SHIELD | Admitting: Obstetrics and Gynecology

## 2016-05-23 VITALS — BP 142/92 | HR 100 | Wt 213.0 lb

## 2016-05-23 DIAGNOSIS — I10 Essential (primary) hypertension: Secondary | ICD-10-CM | POA: Diagnosis not present

## 2016-05-23 DIAGNOSIS — N816 Rectocele: Secondary | ICD-10-CM | POA: Diagnosis not present

## 2016-05-23 DIAGNOSIS — E0800 Diabetes mellitus due to underlying condition with hyperosmolarity without nonketotic hyperglycemic-hyperosmolar coma (NKHHC): Secondary | ICD-10-CM

## 2016-05-23 DIAGNOSIS — E1300 Other specified diabetes mellitus with hyperosmolarity without nonketotic hyperglycemic-hyperosmolar coma (NKHHC): Secondary | ICD-10-CM | POA: Diagnosis not present

## 2016-05-23 NOTE — Progress Notes (Addendum)
[facility] Visit  05/23/16            Patient name: Kayla Wilkinson MRN [id number]  Date of birth: [birth year]  CC & HPI:  Kayla Wilkinson is a 45 y.o. female presenting today for follow up from last visit to discuss rectocele repair.  She has severely elevated Hgb A1c, and has not been managing this. Hgb A1C 12.5  No flowsheet data found. BMET    Component Value Date/Time   NA 135 05/09/2016 1014   K 4.3 05/09/2016 1014   CL 93 (L) 05/09/2016 1014   CO2 24 05/09/2016 1014   GLUCOSE 276 (H) 05/09/2016 1014   BUN 16 05/09/2016 1014   CREATININE 0.50 (L) 05/09/2016 1014   CALCIUM 9.9 05/09/2016 1014   GFRNONAA 116 05/09/2016 1014   GFRAA 133 05/09/2016 1014    ROS:  ROS No complaints at this time   Pertinent History Reviewed:   Reviewed: Significant for DM, HTN Medical         Past Medical History:  Diagnosis Date  . Acid reflux   . Diabetes mellitus   . Hypertension                               Surgical Hx:   History reviewed. No pertinent surgical history. Medications: Reviewed & Updated - see associated section                       Current Outpatient Prescriptions:  .  acetaminophen (TYLENOL) 325 MG tablet, Take 650 mg by mouth every 6 (six) hours as needed., Disp: , Rfl:  .  aspirin EC 81 MG tablet, Take 81 mg by mouth daily., Disp: , Rfl:  .  glipiZIDE (GLUCOTROL) 10 MG tablet, Take 0.5 tablets (5 mg total) by mouth daily before breakfast., Disp: 30 tablet, Rfl: 2 .  losartan-hydrochlorothiazide (HYZAAR) 100-12.5 MG per tablet, Take 1 tablet by mouth daily., Disp: , Rfl:  .  metFORMIN (GLUCOPHAGE) 500 MG tablet, Take 2 tablets (1,000 mg total) by mouth 2 (two) times daily., Disp: 120 tablet, Rfl: 3 .  ranitidine (ZANTAC) 300 MG capsule, Take 300 mg by mouth 2 (two) times daily.  , Disp: , Rfl:  .  valACYclovir (VALTREX) 1000 MG tablet, Take 1 tablet (1,000 mg total) by mouth 2 (two) times daily. X 10 days, then once daily thereafter,  Disp: 30 tablet, Rfl: 11 .  diclofenac (VOLTAREN) 75 MG EC tablet, Take 1 tablet (75 mg total) by mouth 2 (two) times daily. (Patient not taking: Reported on 05/23/2016), Disp: 50 tablet, Rfl: 2 .  metroNIDAZOLE (FLAGYL) 500 MG tablet, Take 1 tablet (500 mg total) by mouth 2 (two) times daily. X 7 days. No sex or alcohol while taking (Patient not taking: Reported on 05/23/2016), Disp: 14 tablet, Rfl: 0 .  miconazole (MICOTIN) 2 % cream, Apply 1 application topically 2 (two) times daily., Disp: , Rfl:  .  Powders (VAGISIL EX), Apply topically., Disp: , Rfl:  .  pravastatin (PRAVACHOL) 40 MG tablet, Take 40 mg by mouth at bedtime. Reported on 10/31/2015, Disp: , Rfl:    Social History: Reviewed -  reports that she has quit smoking. Her smoking use included Cigarettes. She has a 0.37 pack-year smoking history. She has never used smokeless tobacco.  Objective Findings:  Vitals: Blood pressure (!) 142/92, pulse 100, weight 213 lb (96.6 kg), last menstrual  period 05/20/2016.  Physical Examination: discussion only   Discussion: Specific discussion of lifestyle changes, behavioral modifications and healthy eating habits to control DM.   At end of discussion, pt had opportunity to ask questions and has no further questions at this time.   Greater than 50% was spent in counseling and coordination of care with the patient.  She remains resistant, almost argumentative about DM, stating she "doesn't feel bad".  I spent a lot of time explaining importance of DM tx BEFORE she has the long term sequelae of DM  Total time greater than: 25 minutes.    Assessment & Plan:   A:  1. Rectocele  2. Uncontrolled DM, needs increase in medications  P:  1. Refer to [facility] probably insulin therapy. Pt agrees to prompt attention to diabetes. 2. Return in 2 wk for reconsideration of rectocele repair, once diabetes control addressed adequately    By signing my name below, I, Sonum Patel, attest that this  documentation has been prepared under the direction and in the presence of Jonnie Kind, MD. Electronically Signed: Sonum Patel, Education administrator. 05/23/16. 11:41 AM.  I personally performed the services described in this documentation, which was SCRIBED in my presence. The recorded information has been reviewed and considered accurate. It has been edited as necessary during review. Jonnie Kind, MD

## 2016-05-26 DIAGNOSIS — E119 Type 2 diabetes mellitus without complications: Secondary | ICD-10-CM | POA: Insufficient documentation

## 2016-06-06 DIAGNOSIS — E8881 Metabolic syndrome: Secondary | ICD-10-CM | POA: Diagnosis not present

## 2016-06-06 DIAGNOSIS — E1165 Type 2 diabetes mellitus with hyperglycemia: Secondary | ICD-10-CM | POA: Diagnosis not present

## 2016-06-06 DIAGNOSIS — I10 Essential (primary) hypertension: Secondary | ICD-10-CM | POA: Diagnosis not present

## 2016-06-06 DIAGNOSIS — Z1389 Encounter for screening for other disorder: Secondary | ICD-10-CM | POA: Diagnosis not present

## 2016-06-06 DIAGNOSIS — Z6838 Body mass index (BMI) 38.0-38.9, adult: Secondary | ICD-10-CM | POA: Diagnosis not present

## 2016-06-11 ENCOUNTER — Ambulatory Visit: Payer: BLUE CROSS/BLUE SHIELD | Admitting: Obstetrics and Gynecology

## 2016-06-25 ENCOUNTER — Ambulatory Visit (INDEPENDENT_AMBULATORY_CARE_PROVIDER_SITE_OTHER): Payer: BLUE CROSS/BLUE SHIELD | Admitting: Obstetrics and Gynecology

## 2016-06-25 ENCOUNTER — Other Ambulatory Visit: Payer: Self-pay | Admitting: Obstetrics and Gynecology

## 2016-06-25 ENCOUNTER — Encounter: Payer: Self-pay | Admitting: Obstetrics and Gynecology

## 2016-06-25 VITALS — BP 130/80 | HR 80 | Wt 213.0 lb

## 2016-06-25 DIAGNOSIS — E669 Obesity, unspecified: Secondary | ICD-10-CM | POA: Diagnosis not present

## 2016-06-25 DIAGNOSIS — E1169 Type 2 diabetes mellitus with other specified complication: Secondary | ICD-10-CM

## 2016-06-25 DIAGNOSIS — N816 Rectocele: Secondary | ICD-10-CM | POA: Diagnosis not present

## 2016-06-25 NOTE — Progress Notes (Signed)
   [facility] Visit  06/25/16            Patient name: Kayla Wilkinson MRN [id number]  Date of birth: 12/26/1970  CC & HPI:  Kayla Wilkinson is a 45 y.o. female presenting today for follow up after an appointment with PCP. She states they increased her glipizide but did not pursue insulin therapy. She was advised to lose weight, change diet, and check her blood sugars TID.  She reports highest fasting readings have been 132 and that was due to eating cake the night before. She has not starting taking postprandial readings yet.   She states she discussed her possible rectocele surgery with her PCP.   She states her periods are about 6 days in length. States she wants to schedule her posterior repair when she is not bleeding.   ROS:  ROS She has no acute complaints at this time   Pertinent History Reviewed:   Reviewed: Significant for DM Medical         Past Medical History:  Diagnosis Date  . Acid reflux   . Diabetes mellitus   . Hypertension                               Surgical Hx:   History reviewed. No pertinent surgical history. Medications: Reviewed & Updated - see associated section                       Current Outpatient Prescriptions:  .  aspirin EC 81 MG tablet, Take 81 mg by mouth daily., Disp: , Rfl:  .  glipiZIDE (GLUCOTROL) 10 MG tablet, Take 0.5 tablets (5 mg total) by mouth daily before breakfast., Disp: 30 tablet, Rfl: 2 .  losartan-hydrochlorothiazide (HYZAAR) 100-12.5 MG per tablet, Take 1 tablet by mouth daily., Disp: , Rfl:  .  metFORMIN (GLUCOPHAGE) 500 MG tablet, Take 2 tablets (1,000 mg total) by mouth 2 (two) times daily., Disp: 120 tablet, Rfl: 3 .  ranitidine (ZANTAC) 300 MG capsule, Take 300 mg by mouth 2 (two) times daily.  , Disp: , Rfl:  .  valACYclovir (VALTREX) 1000 MG tablet, Take 1 tablet (1,000 mg total) by mouth 2 (two) times daily. X 10 days, then once daily thereafter, Disp: 30 tablet, Rfl: 11 .  acetaminophen  (TYLENOL) 325 MG tablet, Take 650 mg by mouth every 6 (six) hours as needed., Disp: , Rfl:    Social History: Reviewed -  reports that she has quit smoking. Her smoking use included Cigarettes. She has a 0.37 pack-year smoking history. She has never used smokeless tobacco.  Objective Findings:  Vitals: Blood pressure 130/80, pulse 80, weight 213 lb (96.6 kg), last menstrual period 06/19/2016.  Physical Examination: discussion only    Assessment & Plan:   A:  1. Rectocele  2. DM type 2  P:  1. Posterior repair scheduled for 07/08/16    By signing my name below, I, Sonum Patel, attest that this documentation has been prepared under the direction and in the presence of Jonnie Kind, MD. Electronically Signed: Sonum Patel, Education administrator. 06/25/16. 2:25 PM.  I personally performed the services described in this documentation, which was SCRIBED in my presence. The recorded information has been reviewed and considered accurate. It has been edited as necessary during review. Jonnie Kind, MD

## 2016-07-01 NOTE — Patient Instructions (Signed)
Kayla Wilkinson  07/01/2016     @PREFPERIOPPHARMACY @   Your procedure is scheduled on  07/08/2016  Report to [facility] at  645  A.M.  Call this number if you have problems the morning of surgery:  [phone]   Remember:  Do not eat food or drink liquids after midnight.  Take these medicines the morning of surgery with A SIP OF WATER  Losartan, zantac.   Do not wear jewelry, make-up or nail polish.  Do not wear lotions, powders, or perfumes, or deoderant.  Do not shave 48 hours prior to surgery.  Men may shave face and neck.  Do not bring valuables to the hospital.  [facility] is not responsible for any belongings or valuables.  Contacts, dentures or bridgework may not be worn into surgery.  Leave your suitcase in the car.  After surgery it may be brought to your room.  For patients admitted to the hospital, discharge time will be determined by your treatment team.  Patients discharged the day of surgery will not be allowed to drive home.   Name and phone number of your driver:   family Special instructions:  Follow the diet and prep instructions given to you by Dr Glo Herring. Copy enclosed.  Please read over the following fact sheets that you were given. Anesthesia Post-op Instructions and Care and Recovery After Surgery      Anterior and Posterior Colporrhaphy, Sling Procedure, Care After Refer to this sheet in the next few weeks. These instructions provide you with information on caring for yourself after your procedure. Your health care provider may also give you more specific instructions. Your treatment has been planned according to current medical practices, but problems sometimes occur. Call your health care provider if you have any problems or questions after your procedure.  HOME CARE INSTRUCTIONS  Rest as much as possible during the first 2 weeks after the procedure.   Avoid heavy lifting (more than 10 pounds [4.5 kg]), pushing, or  pulling. Limit stair climbing to once or twice a day the first week, then slowly increase this activity.   Avoid standing for prolonged periods of time.   Talk with your health care provider about when you may resume your usual physical activity.   You may resume your normal diet right away.   Drink at least 6-8 glasses of non-caffeinated beverages per day.   Eat a well-balanced diet. Daily portions of food from the meat (protein), milk, fruit, vegetable, and bread families are necessary for your health.   Your normal bowel function should return. If you become constipated, you may:   Take a mild laxative.  Add fruit and bran to your diet.  Drink more liquids.  You may take a shower and wash your hair.   Only take over-the-counter or prescription medicines as directed by your health care provider.   Clean the incision with water. Do not use a dressing unless the incision is draining or irritated. Check your incision daily for redness, draining, swelling, or separation of the skin.   Follow any bladder care instructions provided by your health care provider.   Keep your perineal area (the area between vagina and rectum) clean and dry. Perform perineal care after every bowel movement and each time you urinate. You may take a sitz bath or sit in a tub of clean, warm water when necessary, unless your health care provider tells you otherwise.   Do  not have sexual intercourse until permitted by your health care provider.   Follow up with your health care provider as directed.  SEEK MEDICAL CARE IF:  You have shaking chills.   Your pain is not relieved with medicine or becomes worse.  You have frequent or urgent urination, or you are unable to completely empty your bladder.   You feel a burning sensation when urinating.   You see pus coming from the wounds.  SEEK IMMEDIATE MEDICAL CARE IF:  You develop a fever.  You notice redness, drainage, swelling, or  separation of the skin at the incision site.  You have difficulty breathing.  You are unable to urinate. MAKE SURE YOU:   Understand these instructions.  Will watch your condition.  Will get help right away if you are not doing well or get worse. This information is not intended to replace advice given to you by your health care provider. Make sure you discuss any questions you have with your health care provider. Document Released: 02/05/2004 Document Revised: 02/23/2013 Document Reviewed: 11/12/2012 Elsevier Interactive Patient Education  2017 [employer].  Pelvic Organ Prolapse Introduction Pelvic organ prolapse is the stretching, bulging, or dropping of pelvic organs into an abnormal position. It happens when the muscles and tissues that surround and support pelvic structures are stretched or weak. Pelvic organ prolapse can involve:  Vagina (vaginal prolapse).  Uterus (uterine prolapse).  Bladder (cystocele).  Rectum (rectocele).  Intestines (enterocele). When organs other than the vagina are involved, they often bulge into the vagina or protrude from the vagina, depending on how severe the prolapse is. What are the causes? Causes of this condition include:  Pregnancy, labor, and childbirth.  Long-lasting (chronic) cough.  Chronic constipation.  Obesity.  Past pelvic surgery.  Aging. During and after menopause, a decreased production of the hormone estrogen can weaken pelvic ligaments and muscles.  Consistently lifting more than 50 lb (23 kg).  Buildup of fluid in the abdomen due to certain diseases and other conditions. What are the signs or symptoms? Symptoms of this condition include:  Loss of bladder control when you cough, sneeze, strain, and exercise (stress incontinence). This may be worse immediately following childbirth, and it may gradually improve over time.  Feeling pressure in your pelvis or vagina. This pressure may increase when you cough or  when you are having a bowel movement.  A bulge that protrudes from the opening of your vagina or against your vaginal wall. If your uterus protrudes through the opening of your vagina and rubs against your clothing, you may also experience soreness, ulcers, infection, pain, and bleeding.  Increased effort to have a bowel movement or urinate.  Pain in your low back.  Pain, discomfort, or disinterest in sexual intercourse.  Repeated bladder infections (urinary tract infections).  Difficulty inserting or inability to insert a tampon or applicator. In some people, this condition does not cause any symptoms. How is this diagnosed? Your health care provider may perform an internal and external vaginal and rectal exam. During the exam, you may be asked to cough and strain while you are lying down, sitting, and standing up. Your health care provider will determine if other tests are required, such as bladder function tests. How is this treated? In most cases, this condition needs to be treated only if it produces symptoms. No treatment is guaranteed to correct the prolapse or relieve the symptoms completely. Treatment may include:  Lifestyle changes, such as:  Avoiding drinking beverages that contain  caffeine.  Increasing your intake of high-fiber foods. This can help to decrease constipation and straining during bowel movements.  Emptying your bladder at scheduled times (bladder training therapy). This can help to reduce or avoid urinary incontinence.  Losing weight if you are overweight or obese.  Estrogen. Estrogen may help mild prolapse by increasing the strength and tone of pelvic floor muscles.  Kegel exercises. These may help mild cases of prolapse by strengthening and tightening the muscles of the pelvic floor.  Pessary insertion. A pessary is a soft, flexible device that is placed into your vagina by your health care provider to help support the vaginal walls and keep pelvic organs  in place.  Surgery. This is often the only form of treatment for severe prolapse. Different types of surgeries are available. Follow these instructions at home:  Wear a sanitary pad or absorbent product if you have urinary incontinence.  Avoid heavy lifting and straining with exercise and work. Do not hold your breath when you perform mild to moderate lifting and exercise activities. Limit your activities as directed by your health care provider.  Take medicines only as directed by your health care provider.  Perform Kegel exercises as directed by your health care provider.  If you have a pessary, take care of it as directed by your health care provider. Contact a health care provider if:  Your symptoms interfere with your daily activities or sex life.  You need medicine to help with the discomfort.  You notice bleeding from the vagina that is not related to your period.  You have a fever.  You have pain or bleeding when you urinate.  You have bleeding when you have a bowel movement.  You lose urine when you have sex.  You have chronic constipation.  You have a pessary that falls out.  You have vaginal discharge that has a bad smell.  You have low abdominal pain or cramping that is unusual for you. This information is not intended to replace advice given to you by your health care provider. Make sure you discuss any questions you have with your health care provider. Document Released: 01/18/2014 Document Revised: 11/29/2015 Document Reviewed: 09/05/2013  2017 Elsevier About Rectocele  Overview  A rectocele is a type of hernia which causes different degrees of bulging of the rectal tissues into the vaginal wall.  You may even notice that it presses against the vaginal wall so much that some vaginal tissues droop outside of the opening of your vagina.  Causes of Rectocele  The most common cause is childbirth.  The muscles and ligaments in the pelvis that hold up and  support the female organs and vagina become stretched and weakened during labor and delivery.  The more babies you have, the more the support tissues are stretched and weakened.  Not everyone who has a baby will develop a rectocele.  Some women have stronger supporting tissue in the pelvis and may not have as much of a problem as others.  Women who have a Cesarean section usually do not get rectocele's unless they pushed a long time prior to the cesarean delivery.  Other conditions that can cause a rectocele include chronic constipation, a chronic cough, a lot of heavy lifting, and obesity.  Older women may have this problem because the loss of female hormones causes the vaginal tissue to become weaker.  Symptoms  There may not be any symptoms.  If you do have symptoms, they may include:  Pelvic pressure  in the rectal area  Protrusion of the lower part of the vagina through the opening of the vagina  Constipation and trapping of the stool, making it difficult to have a bowel movement.  In severe cases, you may have to press on the lower part of your vagina to help push the stool out of you rectum.  This is called splinting to empty.  Diagnosing Rectocele  Your health care provider will ask about your symptoms and perform a pelvic exam.  S/he will ask you to bear down, pushing like you are having a bowel movement so as to see how far the lower part of the vagina protrudes into the vagina and possible outside of the vagina.  Your provider will also ask you to contract the muscles of your pelvis (like you are stopping the stream in the middle of urinating) to determine the strength of your pelvic muscles.  Your provider may also do a rectal exam.  Treatment Options  If you do not have any symptoms, no treatment may be necessary.  Other treatment options include:  Pelvic floor exercises: Contracting the muscles in your genital area may help strengthen your muscles and support the organs.  Be sure  to get proper exercise instruction from you physical therapist.  A pessary (removealbe pelvic support device) sometimes helps rectocele symptoms.  Surgery: Surgical repair may be necessary. In some cases the uterus may need to be taken out ( a hysterectomy) as well.  There are many types of surgery for pelvic support problems.  Look for physicians who specialize in repair procedures.  You can take care of yourself by:  Treating and preventing constipation  Avoiding heavy lifting, and lifting correctly (with your legs, not with you waist or back)  Treating a chronic cough or bronchitis  Not smoking  avoiding too much weight gain  Doing pelvic floor exercises   2007, [employer] Doc.33PATIENT INSTRUCTIONS POST-ANESTHESIA  IMMEDIATELY FOLLOWING SURGERY:  Do not drive or operate machinery for the first twenty four hours after surgery.  Do not make any important decisions for twenty four hours after surgery or while taking narcotic pain medications or sedatives.  If you develop intractable nausea and vomiting or a severe headache please notify your doctor immediately.  FOLLOW-UP:  Please make an appointment with your surgeon as instructed. You do not need to follow up with anesthesia unless specifically instructed to do so.  WOUND CARE INSTRUCTIONS (if applicable):  Keep a dry clean dressing on the anesthesia/puncture wound site if there is drainage.  Once the wound has quit draining you may leave it open to air.  Generally you should leave the bandage intact for twenty four hours unless there is drainage.  If the epidural site drains for more than 36-48 hours please call the anesthesia department.  QUESTIONS?:  Please feel free to call your physician or the hospital operator if you have any questions, and they will be happy to assist you.

## 2016-07-02 ENCOUNTER — Encounter (HOSPITAL_COMMUNITY)
Admission: RE | Admit: 2016-07-02 | Discharge: 2016-07-02 | Disposition: A | Discharge status: Home / Self Care | Payer: BLUE CROSS/BLUE SHIELD | Source: Ambulatory Visit | Attending: Obstetrics and Gynecology | Admitting: Obstetrics and Gynecology

## 2016-07-02 ENCOUNTER — Encounter (HOSPITAL_COMMUNITY): Payer: Self-pay

## 2016-07-02 ENCOUNTER — Other Ambulatory Visit: Payer: Self-pay

## 2016-07-02 DIAGNOSIS — Z0183 Encounter for blood typing: Secondary | ICD-10-CM | POA: Insufficient documentation

## 2016-07-02 DIAGNOSIS — Z01812 Encounter for preprocedural laboratory examination: Secondary | ICD-10-CM | POA: Diagnosis not present

## 2016-07-02 DIAGNOSIS — N816 Rectocele: Secondary | ICD-10-CM | POA: Diagnosis not present

## 2016-07-02 LAB — CBC
HCT: 43.4 % (ref 36.0–46.0)
Hemoglobin: 14.7 g/dL (ref 12.0–15.0)
MCH: 30.2 pg (ref 26.0–34.0)
MCHC: 33.9 g/dL (ref 30.0–36.0)
MCV: 89.1 fL (ref 78.0–100.0)
Platelets: 281 K/uL (ref 150–400)
RBC: 4.87 MIL/uL (ref 3.87–5.11)
RDW: 13.2 % (ref 11.5–15.5)
WBC: 8.3 K/uL (ref 4.0–10.5)

## 2016-07-02 LAB — COMPREHENSIVE METABOLIC PANEL
ALBUMIN: 3.7 g/dL (ref 3.5–5.0)
ALK PHOS: 93 U/L (ref 38–126)
ALT: 86 U/L — ABNORMAL HIGH (ref 14–54)
ANION GAP: 11 (ref 5–15)
AST: 49 U/L — AB (ref 15–41)
BILIRUBIN TOTAL: 0.6 mg/dL (ref 0.3–1.2)
BUN: 18 mg/dL (ref 6–20)
CALCIUM: 9.2 mg/dL (ref 8.9–10.3)
CO2: 22 mmol/L (ref 22–32)
Chloride: 98 mmol/L — ABNORMAL LOW (ref 101–111)
Creatinine, Ser: 0.46 mg/dL (ref 0.44–1.00)
GFR calc Af Amer: >60 mL/min (ref >60)
GFR calc non Af Amer: >60 mL/min (ref >60)
GLUCOSE: 333 mg/dL — AB (ref 65–99)
Potassium: 4.2 mmol/L (ref 3.5–5.1)
SODIUM: 131 mmol/L — AB (ref 135–145)
TOTAL PROTEIN: 7.2 g/dL (ref 6.5–8.1)

## 2016-07-02 LAB — TYPE AND SCREEN
ABO/RH(D): O POS
Antibody Screen: NEGATIVE

## 2016-07-02 LAB — HCG, SERUM, QUALITATIVE: Preg, Serum: NEGATIVE

## 2016-07-02 NOTE — Pre-Procedure Instructions (Signed)
Glucose at PAT is 333. Hgb A1C pending. Dr Patsey Berthold and Dr Glo Herring notified. Will do CBG on arrival the morning of surgery.

## 2016-07-03 ENCOUNTER — Telehealth: Payer: Self-pay | Admitting: Obstetrics and Gynecology

## 2016-07-05 LAB — HEMOGLOBIN A1C
HEMOGLOBIN A1C: 12.2 % — AB (ref 4.8–5.6)
Mean Plasma Glucose: 303 mg/dL

## 2016-07-05 NOTE — Telephone Encounter (Signed)
Pt informed of the severely high blood glucose. Pt aware that surgery will be cancelled if it is in 250 plus range the day of surgery. Pt reports she is getting postprandials in 150's ; advised to bring in CBG log when she comes for surgery.   HGB A1C has returned minimally reduced at 12.2. Pt was previously informed of the significant risk of uncontrolled DM.

## 2016-07-08 ENCOUNTER — Ambulatory Visit (HOSPITAL_COMMUNITY): Payer: BLUE CROSS/BLUE SHIELD | Admitting: Anesthesiology

## 2016-07-08 ENCOUNTER — Encounter (HOSPITAL_COMMUNITY)
Admission: RE | Disposition: A | Discharge status: Home / Self Care | Payer: Self-pay | Source: Ambulatory Visit | Attending: Obstetrics and Gynecology

## 2016-07-08 ENCOUNTER — Encounter (HOSPITAL_COMMUNITY): Payer: Self-pay | Admitting: *Deleted

## 2016-07-08 ENCOUNTER — Observation Stay (HOSPITAL_COMMUNITY)
Admission: RE | Admit: 2016-07-08 | Discharge: 2016-07-09 | Disposition: A | Discharge status: Home / Self Care | Payer: BLUE CROSS/BLUE SHIELD | Source: Ambulatory Visit | Attending: Obstetrics and Gynecology | Admitting: Obstetrics and Gynecology

## 2016-07-08 DIAGNOSIS — Z87891 Personal history of nicotine dependence: Secondary | ICD-10-CM | POA: Diagnosis not present

## 2016-07-08 DIAGNOSIS — Z7982 Long term (current) use of aspirin: Secondary | ICD-10-CM | POA: Insufficient documentation

## 2016-07-08 DIAGNOSIS — Z794 Long term (current) use of insulin: Secondary | ICD-10-CM | POA: Insufficient documentation

## 2016-07-08 DIAGNOSIS — Z79899 Other long term (current) drug therapy: Secondary | ICD-10-CM | POA: Diagnosis not present

## 2016-07-08 DIAGNOSIS — E1165 Type 2 diabetes mellitus with hyperglycemia: Secondary | ICD-10-CM | POA: Diagnosis not present

## 2016-07-08 DIAGNOSIS — N816 Rectocele: Principal | ICD-10-CM | POA: Diagnosis present

## 2016-07-08 DIAGNOSIS — I10 Essential (primary) hypertension: Secondary | ICD-10-CM | POA: Insufficient documentation

## 2016-07-08 DIAGNOSIS — K219 Gastro-esophageal reflux disease without esophagitis: Secondary | ICD-10-CM | POA: Diagnosis not present

## 2016-07-08 HISTORY — PX: RECTOCELE REPAIR: SHX761

## 2016-07-08 LAB — GLUCOSE, CAPILLARY
GLUCOSE-CAPILLARY: 185 mg/dL — AB (ref 65–99)
GLUCOSE-CAPILLARY: 337 mg/dL — AB (ref 65–99)
Glucose-Capillary: 198 mg/dL — ABNORMAL HIGH (ref 65–99)

## 2016-07-08 SURGERY — COLPORRHAPHY, POSTERIOR, FOR RECTOCELE REPAIR
Anesthesia: General | Site: Vagina

## 2016-07-08 MED ORDER — MIDAZOLAM HCL 2 MG/2ML IJ SOLN
INTRAMUSCULAR | Status: AC
  Filled 2016-07-08: qty 2

## 2016-07-08 MED ORDER — PROMETHAZINE HCL 25 MG/ML IJ SOLN
12.5000 mg | Freq: Once | INTRAMUSCULAR | Status: AC
  Administered 2016-07-08: 12.5 mg via INTRAVENOUS

## 2016-07-08 MED ORDER — NEOSTIGMINE METHYLSULFATE 10 MG/10ML IV SOLN
INTRAVENOUS | Status: DC | PRN
  Administered 2016-07-08: 1 mg via INTRAVENOUS

## 2016-07-08 MED ORDER — KETOROLAC TROMETHAMINE 30 MG/ML IJ SOLN
INTRAMUSCULAR | Status: AC
  Filled 2016-07-08: qty 1

## 2016-07-08 MED ORDER — SODIUM CHLORIDE 0.9 % IJ SOLN
INTRAMUSCULAR | Status: AC
  Filled 2016-07-08: qty 10

## 2016-07-08 MED ORDER — RANITIDINE HCL 150 MG PO TABS
150.0000 mg | ORAL_TABLET | Freq: Two times a day (BID) | ORAL | Status: DC
  Administered 2016-07-09: 150 mg via ORAL

## 2016-07-08 MED ORDER — ONDANSETRON HCL 4 MG/2ML IJ SOLN: 4.0000 mg | Freq: Four times a day (QID) | INTRAMUSCULAR | Status: DC | PRN

## 2016-07-08 MED ORDER — ONDANSETRON HCL 4 MG/2ML IJ SOLN
4.0000 mg | Freq: Once | INTRAMUSCULAR | Status: AC
  Administered 2016-07-08: 4 mg via INTRAVENOUS

## 2016-07-08 MED ORDER — HYDROMORPHONE HCL 1 MG/ML IJ SOLN
1.0000 mg | INTRAMUSCULAR | Status: DC | PRN
  Administered 2016-07-08 – 2016-07-09 (×5): 1 mg via INTRAVENOUS
  Filled 2016-07-08 (×5): qty 1

## 2016-07-08 MED ORDER — ROCURONIUM BROMIDE 50 MG/5ML IV SOLN
INTRAVENOUS | Status: AC
  Filled 2016-07-08: qty 1

## 2016-07-08 MED ORDER — GLYCOPYRROLATE 0.2 MG/ML IJ SOLN
INTRAMUSCULAR | Status: DC | PRN
  Administered 2016-07-08: 0.4 mg via INTRAVENOUS

## 2016-07-08 MED ORDER — FENTANYL CITRATE (PF) 100 MCG/2ML IJ SOLN
INTRAMUSCULAR | Status: DC | PRN
  Administered 2016-07-08 (×2): 50 ug via INTRAVENOUS
  Administered 2016-07-08: 100 ug via INTRAVENOUS
  Administered 2016-07-08: 50 ug via INTRAVENOUS

## 2016-07-08 MED ORDER — NON FORMULARY: 150.0000 mg | Freq: Two times a day (BID) | Status: DC

## 2016-07-08 MED ORDER — ROCURONIUM BROMIDE 100 MG/10ML IV SOLN
INTRAVENOUS | Status: DC | PRN
  Administered 2016-07-08: 5 mg via INTRAVENOUS
  Administered 2016-07-08: 10 mg via INTRAVENOUS
  Administered 2016-07-08: 35 mg via INTRAVENOUS

## 2016-07-08 MED ORDER — MIDAZOLAM HCL 5 MG/5ML IJ SOLN
INTRAMUSCULAR | Status: DC | PRN
  Administered 2016-07-08 (×2): 2 mg via INTRAVENOUS

## 2016-07-08 MED ORDER — BUPIVACAINE-EPINEPHRINE 0.25% -1:200000 IJ SOLN
INTRAMUSCULAR | Status: DC | PRN
  Administered 2016-07-08: 15 mL

## 2016-07-08 MED ORDER — LOSARTAN POTASSIUM 50 MG PO TABS
25.0000 mg | ORAL_TABLET | Freq: Every day | ORAL | Status: DC
  Administered 2016-07-09: 25 mg via ORAL
  Filled 2016-07-08 (×2): qty 1

## 2016-07-08 MED ORDER — HYDROMORPHONE HCL 1 MG/ML IJ SOLN
0.2500 mg | INTRAMUSCULAR | Status: DC | PRN
  Administered 2016-07-08 (×2): 0.5 mg via INTRAVENOUS
  Filled 2016-07-08 (×2): qty 0.5

## 2016-07-08 MED ORDER — FENTANYL CITRATE (PF) 250 MCG/5ML IJ SOLN
INTRAMUSCULAR | Status: AC
  Filled 2016-07-08: qty 5

## 2016-07-08 MED ORDER — PROMETHAZINE HCL 25 MG/ML IJ SOLN
INTRAMUSCULAR | Status: AC
  Filled 2016-07-08: qty 1

## 2016-07-08 MED ORDER — LIDOCAINE HCL 1 % IJ SOLN
INTRAMUSCULAR | Status: DC | PRN
  Administered 2016-07-08: 25 mg via INTRADERMAL

## 2016-07-08 MED ORDER — CEFAZOLIN SODIUM-DEXTROSE 2-4 GM/100ML-% IV SOLN
2.0000 g | INTRAVENOUS | Status: AC
  Administered 2016-07-08: 2 g via INTRAVENOUS
  Filled 2016-07-08: qty 100

## 2016-07-08 MED ORDER — KETOROLAC TROMETHAMINE 30 MG/ML IJ SOLN
30.0000 mg | Freq: Four times a day (QID) | INTRAMUSCULAR | Status: DC
  Administered 2016-07-08 – 2016-07-09 (×4): 30 mg via INTRAVENOUS
  Filled 2016-07-08 (×3): qty 1

## 2016-07-08 MED ORDER — METFORMIN HCL 500 MG PO TABS: 1000.0000 mg | ORAL_TABLET | Freq: Two times a day (BID) | ORAL | Status: DC

## 2016-07-08 MED ORDER — ONDANSETRON HCL 4 MG/2ML IJ SOLN
INTRAMUSCULAR | Status: AC
  Filled 2016-07-08: qty 2

## 2016-07-08 MED ORDER — FAMOTIDINE 20 MG PO TABS
20.0000 mg | ORAL_TABLET | Freq: Every day | ORAL | Status: DC
  Administered 2016-07-08: 20 mg via ORAL
  Filled 2016-07-08: qty 1

## 2016-07-08 MED ORDER — PROPOFOL 10 MG/ML IV BOLUS
INTRAVENOUS | Status: AC
  Filled 2016-07-08: qty 20

## 2016-07-08 MED ORDER — ONDANSETRON HCL 4 MG PO TABS: 4.0000 mg | ORAL_TABLET | Freq: Four times a day (QID) | ORAL | Status: DC | PRN

## 2016-07-08 MED ORDER — PROPOFOL 10 MG/ML IV BOLUS
INTRAVENOUS | Status: DC | PRN
  Administered 2016-07-08: 150 mg via INTRAVENOUS

## 2016-07-08 MED ORDER — METOPROLOL TARTRATE 5 MG/5ML IV SOLN
INTRAVENOUS | Status: AC
  Filled 2016-07-08: qty 5

## 2016-07-08 MED ORDER — SODIUM CHLORIDE 0.9 % IR SOLN
Status: DC | PRN
  Administered 2016-07-08: 1000 mL

## 2016-07-08 MED ORDER — LACTATED RINGERS IV SOLN
INTRAVENOUS | Status: DC
  Administered 2016-07-08 (×2): via INTRAVENOUS

## 2016-07-08 MED ORDER — MIDAZOLAM HCL 2 MG/2ML IJ SOLN
1.0000 mg | INTRAMUSCULAR | Status: DC | PRN
  Administered 2016-07-08: 2 mg via INTRAVENOUS

## 2016-07-08 MED ORDER — GLYCOPYRROLATE 0.2 MG/ML IJ SOLN
INTRAMUSCULAR | Status: AC
  Filled 2016-07-08: qty 2

## 2016-07-08 MED ORDER — METOPROLOL TARTRATE 5 MG/5ML IV SOLN
INTRAVENOUS | Status: DC | PRN
  Administered 2016-07-08: 1 mg via INTRAVENOUS

## 2016-07-08 MED ORDER — EPHEDRINE SULFATE 50 MG/ML IJ SOLN
INTRAMUSCULAR | Status: AC
  Filled 2016-07-08: qty 1

## 2016-07-08 MED ORDER — INSULIN ASPART 100 UNIT/ML ~~LOC~~ SOLN
0.0000 [IU] | Freq: Three times a day (TID) | SUBCUTANEOUS | Status: DC
  Administered 2016-07-09: 11 [IU] via SUBCUTANEOUS

## 2016-07-08 MED ORDER — OXYCODONE-ACETAMINOPHEN 5-325 MG PO TABS: 1.0000 | ORAL_TABLET | ORAL | Status: DC | PRN

## 2016-07-08 MED ORDER — KETOROLAC TROMETHAMINE 30 MG/ML IJ SOLN
30.0000 mg | Freq: Four times a day (QID) | INTRAMUSCULAR | Status: DC
  Filled 2016-07-08: qty 1

## 2016-07-08 MED ORDER — PANTOPRAZOLE SODIUM 40 MG PO TBEC: 40.0000 mg | DELAYED_RELEASE_TABLET | Freq: Every day | ORAL | Status: DC

## 2016-07-08 MED ORDER — METFORMIN HCL 500 MG PO TABS
1000.0000 mg | ORAL_TABLET | Freq: Two times a day (BID) | ORAL | Status: DC
  Administered 2016-07-08 – 2016-07-09 (×2): 1000 mg via ORAL
  Filled 2016-07-08 (×2): qty 2

## 2016-07-08 MED ORDER — LIDOCAINE HCL (PF) 1 % IJ SOLN
INTRAMUSCULAR | Status: AC
  Filled 2016-07-08: qty 5

## 2016-07-08 MED ORDER — SODIUM CHLORIDE 0.9% FLUSH
INTRAVENOUS | Status: AC
  Filled 2016-07-08: qty 10

## 2016-07-08 MED ORDER — IBUPROFEN 600 MG PO TABS: 600.0000 mg | ORAL_TABLET | Freq: Four times a day (QID) | ORAL | Status: DC | PRN

## 2016-07-08 MED ORDER — KETOROLAC TROMETHAMINE 30 MG/ML IJ SOLN
30.0000 mg | Freq: Once | INTRAMUSCULAR | Status: AC
  Administered 2016-07-08: 30 mg via INTRAVENOUS

## 2016-07-08 MED ORDER — SODIUM CHLORIDE 0.9 % IV SOLN
INTRAVENOUS | Status: DC
  Administered 2016-07-08: 13:00:00 via INTRAVENOUS

## 2016-07-08 MED ORDER — INSULIN GLARGINE 100 UNIT/ML ~~LOC~~ SOLN
20.0000 [IU] | Freq: Every day | SUBCUTANEOUS | Status: DC
  Administered 2016-07-08: 20 [IU] via SUBCUTANEOUS
  Filled 2016-07-08 (×2): qty 0.2

## 2016-07-08 MED ORDER — MENTHOL 3 MG MT LOZG
1.0000 | LOZENGE | OROMUCOSAL | Status: DC | PRN
  Administered 2016-07-08: 3 mg via ORAL
  Filled 2016-07-08: qty 9

## 2016-07-08 MED ORDER — BUPIVACAINE-EPINEPHRINE (PF) 0.25% -1:200000 IJ SOLN
INTRAMUSCULAR | Status: AC
  Filled 2016-07-08: qty 30

## 2016-07-08 SURGICAL SUPPLY — 29 items
BAG HAMPER (MISCELLANEOUS) ×3 IMPLANT
CLOTH BEACON ORANGE TIMEOUT ST (SAFETY) ×3 IMPLANT
COVER LIGHT HANDLE STERIS (MISCELLANEOUS) ×6 IMPLANT
DECANTER SPIKE VIAL GLASS SM (MISCELLANEOUS) ×3 IMPLANT
DRAPE HALF SHEET 40X57 (DRAPES) ×3 IMPLANT
DRAPE PROXIMA HALF (DRAPES) ×3 IMPLANT
DRAPE STERI URO 9X17 APER PCH (DRAPES) ×3 IMPLANT
ELECT REM PT RETURN 9FT ADLT (ELECTROSURGICAL) ×3
ELECTRODE REM PT RTRN 9FT ADLT (ELECTROSURGICAL) ×1 IMPLANT
FORMALIN 10 PREFIL 120ML (MISCELLANEOUS) IMPLANT
GAUZE PACKING 2X5 YD STRL (GAUZE/BANDAGES/DRESSINGS) ×3 IMPLANT
GLOVE BIOGEL PI IND STRL 7.0 (GLOVE) ×1 IMPLANT
GLOVE BIOGEL PI IND STRL 9 (GLOVE) ×1 IMPLANT
GLOVE BIOGEL PI INDICATOR 7.0 (GLOVE) ×2
GLOVE BIOGEL PI INDICATOR 9 (GLOVE) ×2
GLOVE ECLIPSE 9.0 STRL (GLOVE) ×6 IMPLANT
GOWN SPEC L3 XXLG W/TWL (GOWN DISPOSABLE) ×3 IMPLANT
GOWN STRL REUS W/TWL LRG LVL3 (GOWN DISPOSABLE) ×3 IMPLANT
KIT ROOM TURNOVER AP CYSTO (KITS) ×3 IMPLANT
MANIFOLD NEPTUNE II (INSTRUMENTS) ×3 IMPLANT
NEEDLE HYPO 25X1 1.5 SAFETY (NEEDLE) ×3 IMPLANT
NS IRRIG 1000ML POUR BTL (IV SOLUTION) ×3 IMPLANT
PACK PERI GYN (CUSTOM PROCEDURE TRAY) ×3 IMPLANT
PAD ARMBOARD 7.5X6 YLW CONV (MISCELLANEOUS) ×3 IMPLANT
SET BASIN LINEN APH (SET/KITS/TRAYS/PACK) ×3 IMPLANT
SUT CHROMIC 2 0 CT 1 (SUTURE) ×3 IMPLANT
SUT VIC AB 0 CT2 8-18 (SUTURE) ×3 IMPLANT
SYR CONTROL 10ML LL (SYRINGE) ×3 IMPLANT
TRAY FOLEY CATH SILVER 16FR (SET/KITS/TRAYS/PACK) ×3 IMPLANT

## 2016-07-08 NOTE — Anesthesia Preprocedure Evaluation (Signed)
Anesthesia Evaluation  Patient identified by MRN, date of birth, ID band Patient awake    Reviewed: Allergy & Precautions, NPO status , Patient's Chart, lab work & pertinent test results  Airway Mallampati: II  TM Distance: >3 FB     Dental  (+) Teeth Intact   Pulmonary former smoker,    breath sounds clear to auscultation       Cardiovascular hypertension, Pt. on medications  Rhythm:Regular Rate:Normal     Neuro/Psych    GI/Hepatic GERD  Medicated and Controlled,  Endo/Other  diabetes, Type 2, Insulin Dependent  Renal/GU      Musculoskeletal   Abdominal   Peds  Hematology   Anesthesia Other Findings   Reproductive/Obstetrics                             Anesthesia Physical Anesthesia Plan  ASA: III  Anesthesia Plan: General   Post-op Pain Management:    Induction: Intravenous  Airway Management Planned: Oral ETT  Additional Equipment:   Intra-op Plan:   Post-operative Plan: Extubation in OR  Informed Consent: I have reviewed the patients History and Physical, chart, labs and discussed the procedure including the risks, benefits and alternatives for the proposed anesthesia with the patient or authorized representative who has indicated his/her understanding and acceptance.     Plan Discussed with:   Anesthesia Plan Comments:         Anesthesia Quick Evaluation

## 2016-07-08 NOTE — Op Note (Signed)
Please see the brief operative note for surgical details 

## 2016-07-08 NOTE — Brief Op Note (Signed)
07/08/2016  8:55 AM  PATIENT:  Kayla Wilkinson  46 y.o. female  PRE-OPERATIVE DIAGNOSIS:  rectocele  POST-OPERATIVE DIAGNOSIS:  rectocele  PROCEDURE:  Procedure(s): POSTERIOR REPAIR (RECTOCELE) (N/A)  SURGEON:  Surgeon(s) and Role:    * Jonnie Kind, MD - Primary  PHYSICIAN ASSISTANT:   ASSISTANTS: [place] RNFA   ANESTHESIA:   local and general  EBL:  Total I/O In: 1400 [I.V.:1400] Out: 50 [Blood:50]  BLOOD ADMINISTERED:none  DRAINS: Urinary Catheter (Foley) and Vaginal packing   LOCAL MEDICATIONS USED:  MARCAINE    and Amount: 20 ml  SPECIMEN:  No Specimen  DISPOSITION OF SPECIMEN:  N/A  COUNTS:  YES  TOURNIQUET:  * No tourniquets in log *  DICTATION: .Dragon Dictation  PLAN OF CARE: Admit for overnight observation  PATIENT DISPOSITION:  PACU - hemodynamically stable.   Delay start of Pharmacological VTE agent (>24hrs) due to surgical blood loss or risk of bleeding: not applicable Details of procedure: Patient was taken to the operating room prepped and draped for vaginal procedure with legs in candycane lithotomy support with perineum prepped and draped in timeout conducted. Ancef was administered. Preprocedure rectal exam confirmed that the rectocele was very prominent and the bowel evacuation was satisfactory. Marcaine 10 cc was injected into the perineal body lifting the vaginal epithelium which was trimmed at the posterior perineal body 3 cm transverse opening, and then dissected sharply off of the underlying connective tissue, dissecting the vaginal epithelium off sharply. This was performed for a distance approximately 6 cm up the posterior vaginal wall and we were able to identify thickened tissue on the right side that could be retracted across the midline and cephalad to re-create and strong perineal body. A band of solid tissue could be identified on the left side somewhat lateral that would allow reattachment. It was Felt that the the  site-specific defect was to the left of the midline. Series of 6 vertical mattress sutures were placed in the perineal body retracting the thicker tissue on the patient's right side across the midline and somewhat cephalad resulting in significant thickened perineal body. This was done with the surgeon's right index finger in the rectum to ensure integrity of the rectal mucosa and absence of suture inclusion of mucosa. Sutures were tied down by my assistant. Gloves were changed a second layer of sutures were placed on the distal perineal body to rebuild the perineal body further to a total of 8 sutures were used. The vaginal epithelium was then trimmed and reapproximated using 2-0 chromic and vaginal packing with Betadine soaked gauze was placed after Foley catheter inserted revealing clear urine and needle counts correct.

## 2016-07-08 NOTE — H&P (Signed)
Kayla Wilkinson is an 46 y.o. female. She is admitted for posterior repair for rectocele. She underwent bowel prep overnite, had 5 bowel movements , last one this a.m. She has recently been diagnosed with type II diabetes, and recently placed on Insulin, Lantus 20 hs, and was previously on glypizide. CBg this am is 180. HGB A1C is 12. Pt aware of the increased risk of poor wound healing if sugars are high during postop care.  Pertinent Gynecological History: Last mammogram: normal Date: .  Last pap: normal Date: 2016 OB History: G, P   Menstrual History: Menarche age:  Patient's last menstrual period was 06/19/2016 (exact date).    Past Medical History:  Diagnosis Date  . Acid reflux   . Diabetes mellitus   . Hypertension     History reviewed. No pertinent surgical history.  Family History  Problem Relation Age of Onset  . Diabetes Mother   . Diabetes Maternal Grandmother   . Cancer Other   . Cancer - Cervical Maternal Aunt   . Diabetes Son     Social History:  reports that she quit smoking about 4 years ago. Her smoking use included Cigarettes. She has a 0.37 pack-year smoking history. She has never used smokeless tobacco. She reports that she drinks alcohol. She reports that she does not use drugs.  Allergies: No Known Allergies  Prescriptions Prior to Admission  Medication Sig Dispense Refill Last Dose  . acetaminophen (TYLENOL) 325 MG tablet Take 650 mg by mouth every 6 (six) hours as needed for moderate pain or headache.    07/07/2016 at Unknown time  . ARGININE PO Take 1 capsule by mouth daily.   07/07/2016 at Unknown time  . Artificial Tear Solution (SOOTHE XP OP) Apply 1-2 drops to eye daily as needed (dry eyes).   07/07/2016 at Unknown time  . aspirin EC 81 MG tablet Take 81 mg by mouth daily.   07/07/2016 at Unknown time  . Cholecalciferol (VITAMIN D3 PO) Take 1 capsule by mouth daily.   07/07/2016 at Unknown time  . glipiZIDE (GLUCOTROL) 5 MG tablet Take 5 mg by  mouth 2 (two) times daily.   07/07/2016 at Unknown time  . Green Tea, Camillia sinensis, (GREEN TEA EXTRACT PO) Take 1 tablet by mouth daily.   07/07/2016 at Unknown time  . ibuprofen (ADVIL,MOTRIN) 200 MG tablet Take 400 mg by mouth daily as needed for moderate pain.   07/07/2016 at Unknown time  . insulin glargine (LANTUS) 100 UNIT/ML injection Inject 20 Units into the skin at bedtime.   07/07/2016 at Unknown time  . L-GLUTAMINE PO Take 1 capsule by mouth daily.   07/07/2016 at Unknown time  . losartan (COZAAR) 25 MG tablet Take 25 mg by mouth daily.   07/07/2016 at Unknown time  . metFORMIN (GLUCOPHAGE) 1000 MG tablet Take 1,000 mg by mouth 2 (two) times daily with a meal.   07/08/2016 at 0500  . Multiple Vitamin (MULTIVITAMIN WITH MINERALS) TABS tablet Take 1 tablet by mouth daily at 12 noon.   07/07/2016 at Unknown time  . Omega-3 1000 MG CAPS Take 1,000 mg by mouth daily.   07/07/2016 at Unknown time  . Omega-3 Fatty Acids (FISH OIL PO) Take 1 capsule by mouth daily at 12 noon.   07/07/2016 at Unknown time  . OVER THE COUNTER MEDICATION Take 1 capsule by mouth daily. L carnitine supplement   07/07/2016 at Unknown time  . ranitidine (ZANTAC) 150 MG tablet Take 150 mg by  mouth 2 (two) times daily.   07/08/2016 at 0500  . valACYclovir (VALTREX) 1000 MG tablet Take 1 tablet (1,000 mg total) by mouth 2 (two) times daily. X 10 days, then once daily thereafter (Patient taking differently: Take 1,000 mg by mouth daily. ) 30 tablet 11 07/07/2016 at Unknown time  . glipiZIDE (GLUCOTROL) 10 MG tablet Take 0.5 tablets (5 mg total) by mouth daily before breakfast. (Patient not taking: Reported on 07/02/2016) 30 tablet 2 Not Taking at Unknown time  . metFORMIN (GLUCOPHAGE) 500 MG tablet Take 2 tablets (1,000 mg total) by mouth 2 (two) times daily. (Patient not taking: Reported on 07/02/2016) 120 tablet 3 Not Taking at Unknown time    ROS  Blood pressure (!) 152/88, temperature 98.7 F (37.1 C), temperature source Oral, resp. rate  19, last menstrual period 06/19/2016, SpO2 95 %. Physical Exam  Constitutional: She is oriented to person, place, and time. She appears well-developed and well-nourished.  Eyes: Pupils are equal, round, and reactive to light.  Neck: Normal range of motion.  Cardiovascular: Normal rate.   Respiratory: Effort normal.  GI: Soft.  Genitourinary: Uterus normal. Rectal exam shows guaiac negative stool.  Genitourinary Comments: Posterior bulge to 90Degrees, in lower  1/2 of vagina. Intact anal sphincter.  Neurological: She is alert and oriented to person, place, and time.  Skin: Skin is warm and dry.    Results for orders placed or performed during the hospital encounter of 07/08/16 (from the past 24 hour(s))  Glucose, capillary     Status: Abnormal   Collection Time: 07/08/16  7:03 AM  Result Value Ref Range   Glucose-Capillary 185 (H) 65 - 99 mg/dL   CBC    Component Value Date/Time   WBC 8.3 07/02/2016 1153   RBC 4.87 07/02/2016 1153   HGB 14.7 07/02/2016 1153   HCT 43.4 07/02/2016 1153   PLT 281 07/02/2016 1153   MCV 89.1 07/02/2016 1153   MCH 30.2 07/02/2016 1153   MCHC 33.9 07/02/2016 1153   RDW 13.2 07/02/2016 1153    CMP Latest Ref Rng & Units 07/02/2016 05/09/2016  Glucose 65 - 99 mg/dL 333(H) 276(H)  BUN 6 - 20 mg/dL 18 16  Creatinine 0.44 - 1.00 mg/dL 0.46 0.50(L)  Sodium 135 - 145 mmol/L 131(L) 135  Potassium 3.5 - 5.1 mmol/L 4.2 4.3  Chloride 101 - 111 mmol/L 98(L) 93(L)  CO2 22 - 32 mmol/L 22 24  Calcium 8.9 - 10.3 mg/dL 9.2 9.9  Total Protein 6.5 - 8.1 g/dL 7.2 7.2  Total Bilirubin 0.3 - 1.2 mg/dL 0.6 <0.2  Alkaline Phos 38 - 126 U/L 93 120(H)  AST 15 - 41 U/L 49(H) 28  ALT 14 - 54 U/L 86(H) 74(H)    No results found.  Assessment/Plan: 1 Rectocele 2. Diabetes mellitus, sub optimal control.  Plan : posterior repair . Risks benefits, alternatives discussed. Mitzie Marlar V 07/08/2016, 7:23 AM

## 2016-07-08 NOTE — Anesthesia Procedure Notes (Signed)
Procedure Name: Intubation Date/Time: 07/08/2016 7:44 AM Performed by: Charmaine Downs Pre-anesthesia Checklist: Patient identified, Patient being monitored, Timeout performed, Emergency Drugs available and Suction available Patient Re-evaluated:Patient Re-evaluated prior to inductionOxygen Delivery Method: Circle System Utilized Preoxygenation: Pre-oxygenation with 100% oxygen Intubation Type: IV induction Ventilation: Mask ventilation without difficulty Laryngoscope Size: Mac and 3 Grade View: Grade II Tube type: Oral Tube size: 7.0 mm Number of attempts: 1 Airway Equipment and Method: stylet Placement Confirmation: ETT inserted through vocal cords under direct vision,  positive ETCO2 and breath sounds checked- equal and bilateral Secured at: 22 cm Tube secured with: Tape Dental Injury: Teeth and Oropharynx as per pre-operative assessment

## 2016-07-08 NOTE — Transfer of Care (Signed)
Immediate Anesthesia Transfer of Care Note  Patient: Kayla Wilkinson  Procedure(s) Performed: Procedure(s): POSTERIOR REPAIR (RECTOCELE) (N/A)  Patient Location: PACU  Anesthesia Type:General  Level of Consciousness: awake and patient cooperative  Airway & Oxygen Therapy: Patient Spontanous Breathing and Patient connected to face mask oxygen  Post-op Assessment: Report given to RN, Post -op Vital signs reviewed and stable and Patient moving all extremities  Post vital signs: Reviewed and stable  Last Vitals:  Vitals:   07/08/16 0720 07/08/16 0725  BP: 114/67 122/72  Resp: (!) 23 19  Temp:      Last Pain:  Vitals:   07/08/16 0710  TempSrc: Oral      Patients Stated Pain Goal: 10 ([id number])  Complications: No apparent anesthesia complications

## 2016-07-08 NOTE — Anesthesia Postprocedure Evaluation (Signed)
Anesthesia Post Note  Patient: Jameah A Alejo-Cisneros  Procedure(s) Performed: Procedure(s) (LRB): POSTERIOR REPAIR (RECTOCELE) (N/A)  Patient location during evaluation: PACU Anesthesia Type: General Level of consciousness: awake, oriented and patient cooperative Pain management: pain level controlled Vital Signs Assessment: post-procedure vital signs reviewed and stable Respiratory status: spontaneous breathing, nonlabored ventilation, respiratory function stable and patient connected to nasal cannula oxygen Cardiovascular status: blood pressure returned to baseline and stable Postop Assessment: no signs of nausea or vomiting Anesthetic complications: no     Last Vitals:  Vitals:   07/08/16 0720 07/08/16 0725  BP: 114/67 122/72  Resp: (!) 23 19  Temp:      Last Pain:  Vitals:   07/08/16 0710  TempSrc: Oral                 Rayah Fines J

## 2016-07-09 ENCOUNTER — Encounter (HOSPITAL_COMMUNITY): Payer: Self-pay | Admitting: Obstetrics and Gynecology

## 2016-07-09 DIAGNOSIS — N816 Rectocele: Secondary | ICD-10-CM | POA: Diagnosis not present

## 2016-07-09 DIAGNOSIS — I10 Essential (primary) hypertension: Secondary | ICD-10-CM | POA: Diagnosis not present

## 2016-07-09 DIAGNOSIS — Z7982 Long term (current) use of aspirin: Secondary | ICD-10-CM | POA: Diagnosis not present

## 2016-07-09 DIAGNOSIS — Z029 Encounter for administrative examinations, unspecified: Secondary | ICD-10-CM

## 2016-07-09 DIAGNOSIS — Z87891 Personal history of nicotine dependence: Secondary | ICD-10-CM | POA: Diagnosis not present

## 2016-07-09 DIAGNOSIS — E1165 Type 2 diabetes mellitus with hyperglycemia: Secondary | ICD-10-CM | POA: Diagnosis not present

## 2016-07-09 DIAGNOSIS — K219 Gastro-esophageal reflux disease without esophagitis: Secondary | ICD-10-CM | POA: Diagnosis not present

## 2016-07-09 DIAGNOSIS — Z79899 Other long term (current) drug therapy: Secondary | ICD-10-CM | POA: Diagnosis not present

## 2016-07-09 DIAGNOSIS — Z794 Long term (current) use of insulin: Secondary | ICD-10-CM | POA: Diagnosis not present

## 2016-07-09 LAB — BASIC METABOLIC PANEL
ANION GAP: 6 (ref 5–15)
BUN: 11 mg/dL (ref 6–20)
CHLORIDE: 100 mmol/L — AB (ref 101–111)
CO2: 27 mmol/L (ref 22–32)
Calcium: 8.3 mg/dL — ABNORMAL LOW (ref 8.9–10.3)
Creatinine, Ser: 0.42 mg/dL — ABNORMAL LOW (ref 0.44–1.00)
GFR calc Af Amer: >60 mL/min (ref >60)
GLUCOSE: 251 mg/dL — AB (ref 65–99)
POTASSIUM: 3.9 mmol/L (ref 3.5–5.1)
SODIUM: 133 mmol/L — AB (ref 135–145)

## 2016-07-09 LAB — CBC
HCT: 36.7 % (ref 36.0–46.0)
HEMOGLOBIN: 12.3 g/dL (ref 12.0–15.0)
MCH: 30.6 pg (ref 26.0–34.0)
MCHC: 33.5 g/dL (ref 30.0–36.0)
MCV: 91.3 fL (ref 78.0–100.0)
Platelets: 232 10*3/uL (ref 150–400)
RBC: 4.02 MIL/uL (ref 3.87–5.11)
RDW: 13.3 % (ref 11.5–15.5)
WBC: 9.1 10*3/uL (ref 4.0–10.5)

## 2016-07-09 LAB — GLUCOSE, CAPILLARY
GLUCOSE-CAPILLARY: 256 mg/dL — AB (ref 65–99)
Glucose-Capillary: 240 mg/dL — ABNORMAL HIGH (ref 65–99)

## 2016-07-09 MED ORDER — POLYETHYLENE GLYCOL 3350 17 GM/SCOOP PO POWD: 17.0000 g | Freq: Every day | ORAL | 99 refills | Status: DC

## 2016-07-09 MED ORDER — INSULIN GLARGINE 100 UNIT/ML ~~LOC~~ SOLN: 30.0000 [IU] | Freq: Every day | SUBCUTANEOUS | 11 refills | Status: DC

## 2016-07-09 MED ORDER — OXYCODONE-ACETAMINOPHEN 5-325 MG PO TABS: 1.0000 | ORAL_TABLET | Freq: Four times a day (QID) | ORAL | 0 refills | Status: DC | PRN

## 2016-07-09 MED ORDER — IBUPROFEN 200 MG PO TABS: 600.0000 mg | ORAL_TABLET | Freq: Four times a day (QID) | ORAL | 11 refills | Status: DC | PRN

## 2016-07-09 MED ORDER — CIPROFLOXACIN HCL 250 MG PO TABS
500.0000 mg | ORAL_TABLET | Freq: Two times a day (BID) | ORAL | Status: DC
  Administered 2016-07-09: 500 mg via ORAL
  Filled 2016-07-09: qty 2

## 2016-07-09 MED ORDER — CIPROFLOXACIN HCL 500 MG PO TABS: 500.0000 mg | ORAL_TABLET | Freq: Two times a day (BID) | ORAL | 0 refills | Status: DC

## 2016-07-09 MED ORDER — GLIPIZIDE 5 MG PO TABS
5.0000 mg | ORAL_TABLET | Freq: Two times a day (BID) | ORAL | Status: DC
  Administered 2016-07-09: 5 mg via ORAL
  Filled 2016-07-09: qty 1

## 2016-07-09 NOTE — Anesthesia Postprocedure Evaluation (Signed)
Anesthesia Post Note  Patient: Kayla Wilkinson  Procedure(s) Performed: Procedure(s) (LRB): POSTERIOR REPAIR (RECTOCELE) (N/A)  Patient location during evaluation: Nursing Unit Anesthesia Type: General Level of consciousness: awake and alert, oriented and patient cooperative Pain management: pain level controlled Vital Signs Assessment: post-procedure vital signs reviewed and stable Respiratory status: spontaneous breathing, nonlabored ventilation and respiratory function stable Cardiovascular status: blood pressure returned to baseline Postop Assessment: no signs of nausea or vomiting and adequate PO intake Anesthetic complications: no     Last Vitals:  Vitals:   07/08/16 2154 07/09/16 0551  BP: 124/74 119/70  Pulse: (!) 119 93  Resp: 14 14  Temp: 36.6 C 36.7 C    Last Pain:  Vitals:   07/09/16 0917  TempSrc:   PainSc: 9                  [name]

## 2016-07-09 NOTE — Discharge Summary (Signed)
Physician Discharge Summary  Patient ID: NIKELLE TUSS MRN: [id number] DOB/AGE: [birth year] 46 y.o.  Admit date: 07/08/2016 Discharge date: 07/09/2016  Admission Diagnoses: rectocele                                        Diabetes mellitus type II poor control                                        hypertension  Discharge Diagnoses: same Active Problems:   Rectocele   Discharged Condition: stable  Hospital Course: pt was admitted thru day surgery for posterior repair. With CBG 180's,  Pt surgery note documents surgical procedure. Pt had IV pain meds for postop care, and tolerated CHO modified diet. CBG;s increase postop to 300+, despite SSI and Metformin. Pt seems motivated to improve diabetic control. Pt sent home on increased Lantus at 30 u HS, and Metformin 1000 mg bid and Glypizide 5 .  Consults: None  Significant Diagnostic Studies: labs:  CBC Latest Ref Rng & Units 07/09/2016 07/02/2016  WBC 4.0 - 10.5 K/uL 9.1 8.3  Hemoglobin 12.0 - 15.0 g/dL 12.3 14.7  Hematocrit 36.0 - 46.0 % 36.7 43.4  Platelets 150 - 400 K/uL 232 281   CBG (last 3)   Recent Labs  07/09/16 0737 07/09/16 1111  GLUCAP 256* 240*      Treatments: surgery: posterior repair.  Discharge Exam: Blood pressure 119/70, pulse 93, temperature 98 F (36.7 C), temperature source Oral, resp. rate 14, last menstrual period 06/19/2016, SpO2 95 %. General appearance: alert, cooperative, appears stated age and no distress Eyes: conjunctivae/corneas clear. PERRL, EOM's intact. Fundi benign. Resp: clear to auscultation bilaterally GI: soft, non-tender; bowel sounds normal; no masses,  no organomegaly Extremities: extremities normal, atraumatic, no cyanosis or edema  Disposition: 01-Home or Self Care  Discharge Instructions    Call MD for:  extreme fatigue    Complete by:  As directed    Call MD for:  persistant nausea and vomiting    Complete by:  As directed    Call MD for:  severe uncontrolled  pain    Complete by:  As directed    Call MD for:  temperature >100.4    Complete by:  As directed    Diet - low sodium heart healthy    Complete by:  As directed    Increase activity slowly    Complete by:  As directed       Follow-up Information    MANN, BENJAMIN, PA-C Follow up in 1 week(s).   Specialties:  Physician Assistant, Internal Medicine Contact information: [address] [place] [id number] [phone]        Jonnie Kind, MD Follow up in 9 day(s).   Specialties:  Obstetrics and Gynecology, Radiology Contact information: [place] [address] [phone]           Signed: Jonnie Kind 07/09/2016, 8:35 AM

## 2016-07-09 NOTE — Addendum Note (Signed)
Addendum  created 07/09/16 [id number] by Charmaine Downs, CRNA   Sign clinical note

## 2016-07-09 NOTE — Discharge Instructions (Signed)
Anterior and Posterior Colporrhaphy, Care After Refer to this sheet in the next few weeks. These instructions provide you with information on caring for yourself after your procedure. Your health care provider may also give you more specific instructions. Your treatment has been planned according to current medical practices, but problems sometimes occur. Call your health care provider if you have any problems or questions after your procedure. HOME CARE INSTRUCTIONS   Take frequent rest periods throughout the day.   Only take over-the-counter or prescription medicines as directed by your health care provider.   Avoid strenuous activity such as heavy lifting (more than 10 pounds [4.5 kg]), pushing, and pulling until your health care provider says it is okay.   Take showers if your health care provider approves. Pat incisions dry. Do not rub incisions with a washcloth or towel. Do not take tub baths until your health care provider approves.   Wear compression stockings as directed by your health care provider. These stockings help prevent blood clots from forming in your legs.   Talk with your health care provider about when you may return to work and your exercise routine.   Do not drive until your health care provider approves.   You may resume your normal diet. Eat a well-balanced diet.   Drink enough fluids to keep your urine clear or pale yellow.   Your normal bowel function should return. If you become constipated, you may:   Take a mild laxative.   Add fruit and bran to your diet.   Drink more fluids.  Do not have sexual intercourse until permitted by your health care provider.  Follow up with your health care provider as directed. SEEK MEDICAL CARE IF: You have persistent nausea or vomiting.  SEEK IMMEDIATE MEDICAL CARE IF:   You have increased bleeding (more than a small spot) from the vaginal area.   Your pain is not relieved with medicine or becomes  worse.   You have redness, swelling, or increasing pain in the vaginal area.   You have abdominal pain.   You see pus coming from the wounds.   You develop a fever.   You have a foul smell coming from your vaginal area.   You develop light-headedness or you feel faint.   You have difficulty breathing.  MAKE SURE YOU:  Understand these instructions.  Will watch your condition.  Will get help right away if you are not doing well or get worse. This information is not intended to replace advice given to you by your health care provider. Make sure you discuss any questions you have with your health care provider. Document Released: 01/09/2005 Document Revised: 02/23/2013 Document Reviewed: 11/12/2012 Elsevier Interactive Patient Education  2017 [employer].

## 2016-07-09 NOTE — Progress Notes (Addendum)
Patient discharged home.  IV removed - WNL.  Reviewed DC instructions and medications.   Follow up in place, patient instructed to schedule appt with PCP in 1 week.  No question at this time.  Patient in NAD.  Patients home dose of zantac returned to the patient

## 2016-07-09 NOTE — Progress Notes (Signed)
Removed patient packing. Patient tolerated procedure.  Will continue to monitor patient.

## 2016-07-09 NOTE — Progress Notes (Signed)
Inpatient Diabetes Program Recommendations  AACE/ADA: New Consensus Statement on Inpatient Glycemic Control (2015)  Target Ranges:  Prepandial:   less than 140 mg/dL      Peak postprandial:   less than 180 mg/dL (1-2 hours)      Critically ill patients:  140 - 180 mg/dL   Results for LEONAH, STOBBS (MRN [id number]) as of 07/09/2016 07:46  Ref. Range 07/08/2016 07:03 07/08/2016 09:16 07/08/2016 21:58 07/09/2016 07:37  Glucose-Capillary Latest Ref Range: 65 - 99 mg/dL 185 (H) 198 (H) 337 (H) 256 (H)   Review of Glycemic Control  Diabetes history: DM2 Outpatient Diabetes medications: Lantus 20 units QHS, Metformin 1000 mg BID, Glipizide 5 mg BID Current orders for Inpatient glycemic control: Lantus 20 units QHS, Metformin 1000 mg BID, Novolog 0-20 units TID with meals  Inpatient Diabetes Program Recommendations: Insulin - Basal: Please consider increasing Lantus to 25 units QHS. Correction (SSI): Please consider ordering Novolog bedtime correction scale. Insulin - Meal Coverage: Please consider ordering Novolog 4 units TID with meals for meal coverage.  Thanks, Barnie Alderman, RN, MSN, CDE Diabetes Coordinator Inpatient Diabetes Program [phone] (Team Pager from 8am to 5pm)

## 2016-07-15 ENCOUNTER — Telehealth: Payer: Self-pay | Admitting: Obstetrics and Gynecology

## 2016-07-15 NOTE — Telephone Encounter (Signed)
Pt called stating that she needs a refill of her pain medication, I let pt know that Dr. Glo Herring or Elonda Husky is not in the office today. Pt that Kayla Wilkinson would know what is going on and could help her. Pt is not schedule to see Dr. Glo Herring until 1/12. Please contact pt

## 2016-07-17 ENCOUNTER — Other Ambulatory Visit: Payer: Self-pay | Admitting: Obstetrics and Gynecology

## 2016-07-17 MED ORDER — OXYCODONE-ACETAMINOPHEN 5-325 MG PO TABS: 1.0000 | ORAL_TABLET | Freq: Four times a day (QID) | ORAL | 0 refills | Status: DC | PRN

## 2016-07-17 MED ORDER — DOCUSATE SODIUM 100 MG PO CAPS: 100.0000 mg | ORAL_CAPSULE | Freq: Two times a day (BID) | ORAL | 2 refills | Status: DC | PRN

## 2016-07-18 ENCOUNTER — Ambulatory Visit (INDEPENDENT_AMBULATORY_CARE_PROVIDER_SITE_OTHER): Payer: BLUE CROSS/BLUE SHIELD | Admitting: Obstetrics and Gynecology

## 2016-07-18 ENCOUNTER — Encounter: Payer: Self-pay | Admitting: Obstetrics and Gynecology

## 2016-07-18 VITALS — BP 120/82 | HR 110 | Ht 63.0 in | Wt 218.5 lb

## 2016-07-18 DIAGNOSIS — Z09 Encounter for follow-up examination after completed treatment for conditions other than malignant neoplasm: Secondary | ICD-10-CM | POA: Insufficient documentation

## 2016-07-18 DIAGNOSIS — Z9889 Other specified postprocedural states: Secondary | ICD-10-CM

## 2016-07-18 DIAGNOSIS — N816 Rectocele: Secondary | ICD-10-CM

## 2016-07-18 MED ORDER — FLUCONAZOLE 150 MG PO TABS: 150.0000 mg | ORAL_TABLET | Freq: Once | ORAL | 1 refills | Status: AC

## 2016-07-18 MED ORDER — METRONIDAZOLE 0.75 % VA GEL: 1.0000 | Freq: Every day | VAGINAL | 1 refills | Status: DC

## 2016-07-18 NOTE — Progress Notes (Addendum)
Patient ID: Kayla Wilkinson, female   DOB: Jan 02, 1971, 46 y.o.   MRN: [id number]   Subjective:  Kayla Wilkinson is a 46 y.o. female now 10 days status post posterior repair.   Chief Complaint  Patient presents with  . Routine Post Op    posterior repair; vaginal discharge with odor; vaginal itching   she is sore but afebrile, having BM's on stool softener Pt states the repair is still painful and she has noticed vaginal discharge with associated itching.    Review of Systems Negative except improving vaginal pain, vaginal d/c, vaginal itching     Diet:   normal   Bowel movements : normal.  Pain is controlled without any medications.  Objective:  BP 120/82 (BP Location: Left Arm, Patient Position: Sitting, Cuff Size: Large)   Pulse (!) 110   Ht 5\' 3"  (1.6 m)   Wt 218 lb 8 oz (99.1 kg)   LMP 06/19/2016 (Exact Date)   BMI 38.71 kg/m  General:Well developed, well nourished.  No acute distress. Abdomen: Bowel sounds normal, soft, non-tender. Pelvic Exam:    External Genitalia:  Normal.    Vagina: Normal  Incision(s):   Healing well, no drainage, no erythema, no hernia, no swelling, no dehiscence. Well approximated posterior repair, tight vaginal tissues. Felt to be normal.      Assessment:  Post-Op  10 days status post posterior repair.  Diabetic Given note , out of work 4 wk Doing well postoperatively.   Plan:  1.Wound care discussed   2. . current medications: Rx Diflucan, Metrogel  3. 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 Follow up in 2 weeks.   By signing my name below, I, Hansel Feinstein, attest that this documentation has been prepared under the direction and in the presence of Jonnie Kind, MD. Electronically Signed: Hansel Feinstein, ED Scribe. 07/18/16. 10:40 AM.  I personally performed the services described in this  documentation, which was SCRIBED in my presence. The recorded information has been reviewed and considered accurate. It has been edited as necessary during review. Jonnie Kind, MD

## 2016-07-25 DIAGNOSIS — Z1389 Encounter for screening for other disorder: Secondary | ICD-10-CM | POA: Diagnosis not present

## 2016-07-25 DIAGNOSIS — E1165 Type 2 diabetes mellitus with hyperglycemia: Secondary | ICD-10-CM | POA: Diagnosis not present

## 2016-07-25 DIAGNOSIS — I10 Essential (primary) hypertension: Secondary | ICD-10-CM | POA: Diagnosis not present

## 2016-07-25 DIAGNOSIS — Z6839 Body mass index (BMI) 39.0-39.9, adult: Secondary | ICD-10-CM | POA: Diagnosis not present

## 2016-07-25 DIAGNOSIS — E782 Mixed hyperlipidemia: Secondary | ICD-10-CM | POA: Diagnosis not present

## 2016-08-06 ENCOUNTER — Encounter: Payer: BLUE CROSS/BLUE SHIELD | Admitting: Obstetrics and Gynecology

## 2016-08-13 ENCOUNTER — Encounter: Payer: BLUE CROSS/BLUE SHIELD | Admitting: Obstetrics and Gynecology

## 2016-08-15 ENCOUNTER — Encounter: Payer: Self-pay | Admitting: Obstetrics and Gynecology

## 2016-08-15 ENCOUNTER — Ambulatory Visit (INDEPENDENT_AMBULATORY_CARE_PROVIDER_SITE_OTHER): Payer: BLUE CROSS/BLUE SHIELD | Admitting: Obstetrics and Gynecology

## 2016-08-15 VITALS — BP 140/80 | HR 100 | Wt 221.2 lb

## 2016-08-15 DIAGNOSIS — Z9889 Other specified postprocedural states: Secondary | ICD-10-CM

## 2016-08-15 DIAGNOSIS — N816 Rectocele: Secondary | ICD-10-CM

## 2016-08-15 DIAGNOSIS — Z09 Encounter for follow-up examination after completed treatment for conditions other than malignant neoplasm: Secondary | ICD-10-CM

## 2016-08-15 NOTE — Progress Notes (Signed)
   Subjective:  Kayla Wilkinson is a 46 y.o. female now 5.5 weeks status post posterior repair.     Review of Systems She reports having pressure when urinating.    Diet:   Negative except for recent weight gain so attempting to eat healthier. She reports having fasting cbg of ~190-200.  Pt AGAIN informed of the inadequacy of this level of diabetic care. Bowel movements : normal.  The patient is not having any pain. she want to resume sex.  Objective:  BP 140/80 (BP Location: Left Arm, Patient Position: Sitting, Cuff Size: Normal)   Pulse 100   Wt 221 lb 3.2 oz (100.3 kg)   LMP 07/20/2016   BMI 39.18 kg/m  General:Well developed, well nourished.  No acute distress. Abdomen: Bowel sounds normal, soft, non-tender. Pelvic Exam: rectocele is resolved    External Genitalia:  Normal.    Vagina: Normal    Cervix: Normal    Uterus: Normal    Adnexa/Bimanual: Normal  Incision(s):   Healing well, no drainage, no erythema, no hernia, no swelling, no dehiscence,     Assessment:  Post-Op 5.5 weeks s/p posterior repair   inadequate diabetic care management. Doing well postoperatively.   Plan:  1.Wound care discussed   2. . current medications. Metformin and insulin  3. Activity restrictions: none 4. return to work: now. 5. Follow up in PRN   By signing my name below, I, Sonum Patel, attest that this documentation has been prepared under the direction and in the presence of Jonnie Kind, MD. Electronically Signed: Sonum Patel, Education administrator. 08/15/16. 11:19 AM.  I personally performed the services described in this documentation, which was SCRIBED in my presence. The recorded information has been reviewed and considered accurate. It has been edited as necessary during review. Jonnie Kind, MD

## 2016-08-19 ENCOUNTER — Encounter: Payer: Self-pay | Admitting: *Deleted

## 2016-08-19 ENCOUNTER — Telehealth: Payer: Self-pay | Admitting: *Deleted

## 2016-08-19 NOTE — Telephone Encounter (Signed)
Work note re-faxed to [employer]- [phone].

## 2016-08-28 ENCOUNTER — Telehealth: Payer: Self-pay | Admitting: Obstetrics and Gynecology

## 2016-08-28 NOTE — Telephone Encounter (Signed)
Pt called stating that Dr. Glo Herring has performed surgery.On 1/12 she was spotting, on the 1/14 period started normal, can the surgery make her period skip a cycle?pt states that she always had normal punctual period. She said for this month she had Lite pink spotting for 5 days. Pt wants to know if that is normal with the surgery she had. Pt would like a phone call from Dr. Glo Herring only.

## 2016-09-03 ENCOUNTER — Telehealth: Payer: Self-pay | Admitting: *Deleted

## 2016-09-03 NOTE — Telephone Encounter (Signed)
Patient called concerned about only having a very "light spotting period" this month. She recently had surgery and is concerned it is related to that. I informed patient that surgery is a stressor and could affect her period and not to worry. She is sexually active and is not on birth control. I advised her if her period does not come next month, to give [place] a call. Pt verbalized understanding.

## 2016-09-05 ENCOUNTER — Encounter (HOSPITAL_COMMUNITY): Payer: Self-pay | Admitting: *Deleted

## 2016-09-05 ENCOUNTER — Emergency Department (HOSPITAL_COMMUNITY): Payer: BLUE CROSS/BLUE SHIELD

## 2016-09-05 ENCOUNTER — Emergency Department (HOSPITAL_COMMUNITY)
Admission: EM | Admit: 2016-09-05 | Discharge: 2016-09-06 | Disposition: A | Discharge status: Home / Self Care | Payer: BLUE CROSS/BLUE SHIELD | Attending: Emergency Medicine | Admitting: Emergency Medicine

## 2016-09-05 DIAGNOSIS — Z794 Long term (current) use of insulin: Secondary | ICD-10-CM | POA: Diagnosis not present

## 2016-09-05 DIAGNOSIS — I10 Essential (primary) hypertension: Secondary | ICD-10-CM | POA: Insufficient documentation

## 2016-09-05 DIAGNOSIS — R0781 Pleurodynia: Secondary | ICD-10-CM | POA: Diagnosis not present

## 2016-09-05 DIAGNOSIS — R000 Tachycardia, unspecified: Secondary | ICD-10-CM

## 2016-09-05 DIAGNOSIS — R05 Cough: Secondary | ICD-10-CM | POA: Diagnosis not present

## 2016-09-05 DIAGNOSIS — R0789 Other chest pain: Secondary | ICD-10-CM | POA: Diagnosis present

## 2016-09-05 DIAGNOSIS — E119 Type 2 diabetes mellitus without complications: Secondary | ICD-10-CM | POA: Diagnosis not present

## 2016-09-05 DIAGNOSIS — Z87891 Personal history of nicotine dependence: Secondary | ICD-10-CM | POA: Diagnosis not present

## 2016-09-05 DIAGNOSIS — Z79899 Other long term (current) drug therapy: Secondary | ICD-10-CM | POA: Diagnosis not present

## 2016-09-05 DIAGNOSIS — J209 Acute bronchitis, unspecified: Secondary | ICD-10-CM | POA: Diagnosis not present

## 2016-09-05 LAB — BASIC METABOLIC PANEL
ANION GAP: 10 (ref 5–15)
BUN: 16 mg/dL (ref 6–20)
CO2: 25 mmol/L (ref 22–32)
Calcium: 9.5 mg/dL (ref 8.9–10.3)
Chloride: 99 mmol/L — ABNORMAL LOW (ref 101–111)
Creatinine, Ser: 0.62 mg/dL (ref 0.44–1.00)
GFR calc Af Amer: >60 mL/min (ref >60)
Glucose, Bld: 286 mg/dL — ABNORMAL HIGH (ref 65–99)
POTASSIUM: 4 mmol/L (ref 3.5–5.1)
SODIUM: 134 mmol/L — AB (ref 135–145)

## 2016-09-05 LAB — CBC WITH DIFFERENTIAL/PLATELET
BASOS ABS: 0 10*3/uL (ref 0.0–0.1)
BASOS PCT: 0 %
EOS ABS: 0.4 10*3/uL (ref 0.0–0.7)
EOS PCT: 4 %
HCT: 39.8 % (ref 36.0–46.0)
Hemoglobin: 14 g/dL (ref 12.0–15.0)
Lymphocytes Relative: 36 %
Lymphs Abs: 3.5 10*3/uL (ref 0.7–4.0)
MCH: 30.7 pg (ref 26.0–34.0)
MCHC: 35.2 g/dL (ref 30.0–36.0)
MCV: 87.3 fL (ref 78.0–100.0)
Monocytes Absolute: 0.9 10*3/uL (ref 0.1–1.0)
Monocytes Relative: 9 %
Neutro Abs: 4.9 10*3/uL (ref 1.7–7.7)
Neutrophils Relative %: 51 %
PLATELETS: 283 10*3/uL (ref 150–400)
RBC: 4.56 MIL/uL (ref 3.87–5.11)
RDW: 12.7 % (ref 11.5–15.5)
WBC: 9.6 10*3/uL (ref 4.0–10.5)

## 2016-09-05 LAB — D-DIMER, QUANTITATIVE (NOT AT ARMC): D DIMER QUANT: 0.33 ug{FEU}/mL (ref 0.00–0.50)

## 2016-09-05 LAB — TROPONIN I: Troponin I: <0.03 ng/mL (ref <0.03)

## 2016-09-05 LAB — POC URINE PREG, ED: PREG TEST UR: NEGATIVE

## 2016-09-05 MED ORDER — BENZONATATE 100 MG PO CAPS
200.0000 mg | ORAL_CAPSULE | Freq: Once | ORAL | Status: AC
  Administered 2016-09-05: 200 mg via ORAL
  Filled 2016-09-05: qty 2

## 2016-09-05 NOTE — ED Provider Notes (Signed)
[facility] DEPT Provider Note   CSN: [id number] Arrival date & time: 09/05/16  2129     History   Chief Complaint Chief Complaint  Patient presents with  . Cough    HPI Kayla Wilkinson is a 46 y.o. female with a history of type II DM and HTN, recovered from pneumonia superimposed on influenza approximately 3 weeks ago and felt she was better until 2 days ago when she redeveloped a cough sometimes productive of clear sputum, but additionally with left sided chest pressure "like something is going to pop" when coughing and pain with radiation into her left upper back triggered by cough and deep inspiration.  She reports subjective fevers evidenced by chills.  She also states having right knee swelling 2 weeks ago of unclear cause, but now resolved.  She has taken mucinex severe cough formula without relief, last dose taken 2 days ago.  She denies nasal congestion, headache, sore throat, nausea, vomiting, abdominal pain.    The history is provided by the patient.    Past Medical History:  Diagnosis Date  . Acid reflux   . Diabetes mellitus   . Hypertension     Patient Active Problem List   Diagnosis Date Noted  . Postop check 07/18/2016  . Diabetes mellitus ([place]) 05/26/2016  . Diabetes mellitus type 2 in obese ([place]) 05/14/2016  . Genital herpes 03/31/2016  . Rectocele 03/24/2016  . Rotator cuff tear, left 07/28/2013  . Pain in joint, shoulder region 06/16/2013  . Muscle weakness (generalized) 06/16/2013  . Bursitis of shoulder region 06/07/2013  . Ankle fracture 08/18/2011  . Ankle fracture, lateral malleolus, closed 05/20/2011    Past Surgical History:  Procedure Laterality Date  . RECTOCELE REPAIR N/A 07/08/2016   Procedure: POSTERIOR REPAIR (RECTOCELE);  Surgeon: Jonnie Kind, MD;  Location: [facility];  Service: Gynecology;  Laterality: N/A;    OB History    Gravida Para Term Preterm AB Living   3 2 2   1      SAB TAB Ectopic Multiple Live Births   1                Home Medications    Prior to Admission medications   Medication Sig Start Date End Date Taking? Authorizing Provider  Amino Acids (L-CARNITINE PO) Take 1 capsule by mouth daily.   Yes Historical Provider, MD  ARGININE PO Take 1 capsule by mouth daily.   Yes Historical Provider, MD  Cholecalciferol (VITAMIN D3 PO) Take 1 capsule by mouth daily.   Yes Historical Provider, MD  docusate sodium (COLACE) 100 MG capsule Take 1 capsule (100 mg total) by mouth 2 (two) times daily as needed. 07/17/16  Yes Jonnie Kind, MD  glipiZIDE (GLUCOTROL) 5 MG tablet Take 10 mg by mouth 2 (two) times daily.    Yes Historical Provider, MD  Nyoka Cowden Tea, Camillia sinensis, (GREEN TEA EXTRACT PO) Take 1 tablet by mouth daily.   Yes Historical Provider, MD  insulin glargine (LANTUS) 100 UNIT/ML injection Inject 0.3 mLs (30 Units total) into the skin at bedtime. 07/09/16  Yes Jonnie Kind, MD  L-GLUTAMINE PO Take 1 capsule by mouth daily.   Yes Historical Provider, MD  losartan (COZAAR) 25 MG tablet Take 25 mg by mouth daily.   Yes Historical Provider, MD  metFORMIN (GLUCOPHAGE) 1000 MG tablet Take 1,000 mg by mouth 2 (two) times daily with a meal.   Yes Historical Provider, MD  Multiple Vitamin (MULTIVITAMIN WITH MINERALS) TABS tablet  Take 1 tablet by mouth daily at 12 noon.   Yes Historical Provider, MD  Omega-3 1000 MG CAPS Take 1,000 mg by mouth daily.   Yes Historical Provider, MD  Omega-3 Fatty Acids (FISH OIL PO) Take 1 capsule by mouth daily at 12 noon.   Yes Historical Provider, MD  polyethylene glycol powder (MIRALAX) powder Take 17 g by mouth daily. To prevent constipation  postoperative Patient taking differently: Take 17 g by mouth daily as needed for mild constipation or moderate constipation. To prevent constipation  postoperative 07/09/16  Yes Jonnie Kind, MD  ranitidine (ZANTAC) 150 MG tablet Take 150 mg by mouth 2 (two) times daily.   Yes Historical Provider, MD  valACYclovir (VALTREX)  1000 MG tablet Take 1 tablet (1,000 mg total) by mouth 2 (two) times daily. X 10 days, then once daily thereafter Patient taking differently: Take 1,000 mg by mouth daily.  03/24/16  Yes [name], CNM  Artificial Tear Solution (SOOTHE XP OP) Apply 1-2 drops to eye daily as needed (dry eyes).    Historical Provider, MD  metroNIDAZOLE (METROGEL) 0.75 % vaginal gel Place 1 Applicatorful vaginally at bedtime. Apply one applicatorful to vagina at bedtime for 5 days Patient not taking: Reported on 08/15/2016 07/18/16   Jonnie Kind, MD  oxyCODONE-acetaminophen (PERCOCET/ROXICET) 5-325 MG tablet Take 1-2 tablets by mouth every 6 (six) hours as needed for severe pain (moderate to severe pain (when tolerating fluids)). Patient not taking: Reported on 08/15/2016 07/17/16   Jonnie Kind, MD    Family History Family History  Problem Relation Age of Onset  . Diabetes Mother   . Diabetes Maternal Grandmother   . Cancer Other   . Cancer - Cervical Maternal Aunt   . Diabetes Son     Social History Social History  Substance Use Topics  . Smoking status: Former Smoker    Packs/day: 0.25    Years: 1.50    Types: Cigarettes    Quit date: 07/02/2012  . Smokeless tobacco: Never Used  . Alcohol use No     Allergies   Patient has no known allergies.   Review of Systems Review of Systems  Constitutional: Positive for chills.  HENT: Negative for congestion and sore throat.   Eyes: Negative.   Respiratory: Positive for chest tightness and shortness of breath.   Cardiovascular: Positive for chest pain and palpitations. Negative for leg swelling.  Gastrointestinal: Negative for abdominal pain, diarrhea, nausea and vomiting.  Genitourinary: Negative.   Musculoskeletal: Negative for arthralgias, joint swelling and neck pain.  Skin: Negative.  Negative for rash and wound.  Neurological: Negative for dizziness, weakness, light-headedness, numbness and headaches.  Psychiatric/Behavioral:  Negative.      Physical Exam Updated Vital Signs BP 131/85   Pulse 94   Temp 98.2 F (36.8 C) (Oral)   Resp 19   Ht 5\' 4"  (1.626 m)   Wt 101.6 kg   LMP 07/20/2016   SpO2 97%   BMI 38.45 kg/m   Physical Exam  Constitutional: She appears well-developed and well-nourished.  HENT:  Head: Normocephalic and atraumatic.  Eyes: Conjunctivae are normal.  Neck: Normal range of motion.  Cardiovascular: Regular rhythm, normal heart sounds and intact distal pulses.  Tachycardia present.   Pulmonary/Chest: Effort normal and breath sounds normal. She has no wheezes. She exhibits tenderness.  Soreness left upper chest with palpation.  No back pain on exam.  Abdominal: Soft. Bowel sounds are normal. There is no tenderness.  Musculoskeletal:  Normal range of motion. She exhibits no edema or tenderness.  No ankle or calf edema.  Neurological: She is alert.  Skin: Skin is warm and dry.  Psychiatric: She has a normal mood and affect.  Nursing note and vitals reviewed.    ED Treatments / Results  Labs (all labs ordered are listed, but only abnormal results are displayed)  Results for orders placed or performed during the hospital encounter of 09/05/16  CBC with Differential  Result Value Ref Range   WBC 9.6 4.0 - 10.5 K/uL   RBC 4.56 3.87 - 5.11 MIL/uL   Hemoglobin 14.0 12.0 - 15.0 g/dL   HCT 39.8 36.0 - 46.0 %   MCV 87.3 78.0 - 100.0 fL   MCH 30.7 26.0 - 34.0 pg   MCHC 35.2 30.0 - 36.0 g/dL   RDW 12.7 11.5 - 15.5 %   Platelets 283 150 - 400 K/uL   Neutrophils Relative % 51 %   Neutro Abs 4.9 1.7 - 7.7 K/uL   Lymphocytes Relative 36 %   Lymphs Abs 3.5 0.7 - 4.0 K/uL   Monocytes Relative 9 %   Monocytes Absolute 0.9 0.1 - 1.0 K/uL   Eosinophils Relative 4 %   Eosinophils Absolute 0.4 0.0 - 0.7 K/uL   Basophils Relative 0 %   Basophils Absolute 0.0 0.0 - 0.1 K/uL  Basic metabolic panel  Result Value Ref Range   Sodium 134 (L) 135 - 145 mmol/L   Potassium 4.0 3.5 - 5.1 mmol/L     Chloride 99 (L) 101 - 111 mmol/L   CO2 25 22 - 32 mmol/L   Glucose, Bld 286 (H) 65 - 99 mg/dL   BUN 16 6 - 20 mg/dL   Creatinine, Ser 0.62 0.44 - 1.00 mg/dL   Calcium 9.5 8.9 - 10.3 mg/dL   GFR calc non Af Amer >60 >60 mL/min   GFR calc Af Amer >60 >60 mL/min   Anion gap 10 5 - 15  D-dimer, quantitative (not at [facility])  Result Value Ref Range   D-Dimer, Quant 0.33 0.00 - 0.50 ug/mL-FEU  Troponin I  Result Value Ref Range   Troponin I <0.03 <0.03 ng/mL  POC Urine Pregnancy, ED (do NOT order at [facility])  Result Value Ref Range   Preg Test, Ur NEGATIVE NEGATIVE   Dg Chest 2 View  Result Date: 09/05/2016 CLINICAL DATA:  Productive cough for 2 weeks. EXAM: CHEST  2 VIEW COMPARISON:  Chest radiographs 01/10/2015 FINDINGS: The cardiomediastinal contours are normal. The lungs are clear. Pulmonary vasculature is normal. No consolidation, pleural effusion, or pneumothorax. No acute osseous abnormalities are seen. IMPRESSION: No acute pulmonary process. Electronically Signed   By: Jeb Levering M.D.   On: 09/05/2016 22:33     EKG  EKG Interpretation  Date/Time:  Friday September 05 2016 23:11:03 EST Ventricular Rate:  105 PR Interval:    QRS Duration: 83 QT Interval:  341 QTC Calculation: 460 R Axis:   11 Text Interpretation:  Sinus tachycardia Low voltage, precordial leads Consider anterior infarct No significant change since last tracing Confirmed by HORTON  MD, COURTNEY ([id number]) on 09/06/2016 12:29:09 AM       Radiology Dg Chest 2 View  Result Date: 09/05/2016 CLINICAL DATA:  Productive cough for 2 weeks. EXAM: CHEST  2 VIEW COMPARISON:  Chest radiographs 01/10/2015 FINDINGS: The cardiomediastinal contours are normal. The lungs are clear. Pulmonary vasculature is normal. No consolidation, pleural effusion, or pneumothorax. No acute osseous abnormalities are seen. IMPRESSION:  No acute pulmonary process. Electronically Signed   By: Jeb Levering M.D.   On: 09/05/2016 22:33     Procedures Procedures (including critical care time)  Medications Ordered in ED Medications  sodium chloride 0.9 % bolus 1,000 mL (not administered)  benzonatate (TESSALON) capsule 200 mg (200 mg Oral Given 09/05/16 2307)     Initial Impression / Assessment and Plan / ED Course  I have reviewed the triage vital signs and the nursing notes.  Pertinent labs & imaging results that were available during my care of the patient were reviewed by me and considered in my medical decision making (see chart for details).     Pt with persistent cough with pleuritic left chest pain radiating into back.  Normal ekg, except for rate.  Normal range d dimer.  Pt was ambulated prior to dc with normal pulse ox ranges, but with persistent tachycardia to 130's.  No dyspnea.   IV fluids ordered, tsh added to labs.   Discussed with Dr. Dina Rich who will see pt and assume care.   Final Clinical Impressions(s) / ED Diagnoses   Final diagnoses:  Pleuritic chest pain  Tachycardia    New Prescriptions New Prescriptions   No medications on file     Evalee Jefferson, PA-C 09/06/16 0058    Evalee Jefferson, PA-C 09/06/16 [id number]    Merryl Hacker, MD 09/10/16 [id number]

## 2016-09-05 NOTE — ED Triage Notes (Signed)
Son recently had pneumonia and flu dx 2-3 weeks ago. Pt reports that she has had a cough for the last 2-3 weeks and now she feels like she has chills and cough and when she coughs it feels that "something's going to pop" and reports it hurts when she breathes in and out.  Pt speaks in complete sentences and does not appear short of breath. She denies expectorant when she coughs

## 2016-09-05 NOTE — ED Notes (Signed)
Pt taken to xray 

## 2016-09-05 NOTE — ED Notes (Signed)
Pt states cough for the past 2 weeks. Was productive but now dry. Pt says hurts in her back & chest when she coughs.

## 2016-09-06 LAB — TSH: TSH: 1.835 u[IU]/mL (ref 0.350–4.500)

## 2016-09-06 MED ORDER — BENZONATATE 100 MG PO CAPS: 200.0000 mg | ORAL_CAPSULE | Freq: Three times a day (TID) | ORAL | 0 refills | Status: DC | PRN

## 2016-09-06 MED ORDER — NAPROXEN 500 MG PO TABS: 500.0000 mg | ORAL_TABLET | Freq: Two times a day (BID) | ORAL | 0 refills | Status: DC

## 2016-09-06 MED ORDER — SODIUM CHLORIDE 0.9 % IV BOLUS (SEPSIS)
1000.0000 mL | Freq: Once | INTRAVENOUS | Status: AC
  Administered 2016-09-06: 1000 mL via INTRAVENOUS

## 2016-09-06 MED ORDER — GUAIFENESIN-CODEINE 100-10 MG/5ML PO SOLN: 10.0000 mL | Freq: Four times a day (QID) | ORAL | 0 refills | Status: DC | PRN

## 2016-09-06 NOTE — Discharge Instructions (Signed)
Use the medicines prescribed for your cough and your chest wall pain.  Your labs and chest xray tonight are negative for pneumonia or other lung infection requiring antibiotics.  Rest and make sure you are drinking plenty of fluids.  You may take the  cough syrup if needed for additional cough relief - this will make you sleepy so do not drive within 4 hours of taking this medicine.

## 2016-09-06 NOTE — ED Notes (Signed)
Pt alert & oriented x4, stable gait. Patient given discharge instructions, paperwork & prescription(s). Patient informed not to drive, operate any equipment & handel any important documents 4 hours after taking pain medication. Patient  instructed to stop at the registration desk to finish any additional paperwork. Patient  verbalized understanding. Pt left department w/ no further questions. 

## 2016-09-06 NOTE — ED Notes (Signed)
O2 while ambulating 97-99% with no SOB and HR 130's

## 2016-11-07 ENCOUNTER — Encounter: Payer: Self-pay | Admitting: *Deleted

## 2016-11-07 ENCOUNTER — Ambulatory Visit: Payer: BLUE CROSS/BLUE SHIELD | Admitting: Obstetrics and Gynecology

## 2017-01-29 DIAGNOSIS — K529 Noninfective gastroenteritis and colitis, unspecified: Secondary | ICD-10-CM | POA: Diagnosis not present

## 2017-01-29 DIAGNOSIS — J329 Chronic sinusitis, unspecified: Secondary | ICD-10-CM | POA: Diagnosis not present

## 2017-02-04 DIAGNOSIS — Z6841 Body Mass Index (BMI) 40.0 and over, adult: Secondary | ICD-10-CM | POA: Diagnosis not present

## 2017-02-04 DIAGNOSIS — R197 Diarrhea, unspecified: Secondary | ICD-10-CM | POA: Diagnosis not present

## 2017-02-04 DIAGNOSIS — K5289 Other specified noninfective gastroenteritis and colitis: Secondary | ICD-10-CM | POA: Diagnosis not present

## 2017-02-04 DIAGNOSIS — R1013 Epigastric pain: Secondary | ICD-10-CM | POA: Diagnosis not present

## 2017-02-04 DIAGNOSIS — E1165 Type 2 diabetes mellitus with hyperglycemia: Secondary | ICD-10-CM | POA: Diagnosis not present

## 2017-02-04 DIAGNOSIS — R109 Unspecified abdominal pain: Secondary | ICD-10-CM | POA: Diagnosis not present

## 2017-02-04 LAB — HEMOGLOBIN A1C: Hemoglobin A1C: 12

## 2017-02-20 DIAGNOSIS — I10 Essential (primary) hypertension: Secondary | ICD-10-CM | POA: Diagnosis not present

## 2017-02-20 DIAGNOSIS — E1165 Type 2 diabetes mellitus with hyperglycemia: Secondary | ICD-10-CM | POA: Diagnosis not present

## 2017-02-20 DIAGNOSIS — Z6841 Body Mass Index (BMI) 40.0 and over, adult: Secondary | ICD-10-CM | POA: Diagnosis not present

## 2017-02-20 DIAGNOSIS — E782 Mixed hyperlipidemia: Secondary | ICD-10-CM | POA: Diagnosis not present

## 2017-03-03 DIAGNOSIS — J069 Acute upper respiratory infection, unspecified: Secondary | ICD-10-CM | POA: Diagnosis not present

## 2017-03-03 DIAGNOSIS — J329 Chronic sinusitis, unspecified: Secondary | ICD-10-CM | POA: Diagnosis not present

## 2017-03-18 DIAGNOSIS — J069 Acute upper respiratory infection, unspecified: Secondary | ICD-10-CM | POA: Diagnosis not present

## 2017-03-18 DIAGNOSIS — H6593 Unspecified nonsuppurative otitis media, bilateral: Secondary | ICD-10-CM | POA: Diagnosis not present

## 2017-03-18 DIAGNOSIS — J209 Acute bronchitis, unspecified: Secondary | ICD-10-CM | POA: Diagnosis not present

## 2017-03-18 DIAGNOSIS — Z6838 Body mass index (BMI) 38.0-38.9, adult: Secondary | ICD-10-CM | POA: Diagnosis not present

## 2017-04-09 ENCOUNTER — Encounter: Payer: Self-pay | Admitting: Podiatry

## 2017-04-09 ENCOUNTER — Ambulatory Visit (INDEPENDENT_AMBULATORY_CARE_PROVIDER_SITE_OTHER): Payer: BLUE CROSS/BLUE SHIELD

## 2017-04-09 ENCOUNTER — Other Ambulatory Visit: Payer: Self-pay | Admitting: Podiatry

## 2017-04-09 ENCOUNTER — Ambulatory Visit (INDEPENDENT_AMBULATORY_CARE_PROVIDER_SITE_OTHER): Payer: BLUE CROSS/BLUE SHIELD | Admitting: Podiatry

## 2017-04-09 DIAGNOSIS — M79671 Pain in right foot: Secondary | ICD-10-CM

## 2017-04-09 DIAGNOSIS — M722 Plantar fascial fibromatosis: Secondary | ICD-10-CM

## 2017-04-09 MED ORDER — TRIAMCINOLONE ACETONIDE 10 MG/ML IJ SUSP
10.0000 mg | Freq: Once | INTRAMUSCULAR | Status: AC
  Administered 2017-04-09: 10 mg

## 2017-04-09 NOTE — Progress Notes (Signed)
Subjective:    Patient ID: Kayla Wilkinson, female   DOB: 46 y.o.   MRN: [id number]   HPI patient states she's had a lot of pain in her right heel and she admits that she should have come in last year for orthotics    ROS      Objective:  Physical Exam neurovascular status intact with three-month history of intense discomfort right plantar fascia at the insertional point tendon calcaneus with depression of the arch noted     Assessment:   Acute plantar fasciitis of 3 months duration right with chronic foot structural issues      Plan:    H&P condition reviewed and injected the right plantar fascia 3 mg Kenalog 5 mg Xylocaine and instructed on physical therapy anti-inflammatories and scanned for custom orthotics to lift up plantar arch and take stress off the heel for the long-term

## 2017-04-09 NOTE — Patient Instructions (Signed)

## 2017-04-14 ENCOUNTER — Ambulatory Visit: Payer: BLUE CROSS/BLUE SHIELD | Admitting: Allergy & Immunology

## 2017-04-21 ENCOUNTER — Encounter: Payer: Self-pay | Admitting: Allergy & Immunology

## 2017-04-30 ENCOUNTER — Emergency Department (HOSPITAL_COMMUNITY)
Admission: EM | Admit: 2017-04-30 | Discharge: 2017-05-01 | Disposition: A | Discharge status: Home / Self Care | Payer: BLUE CROSS/BLUE SHIELD | Attending: Emergency Medicine | Admitting: Emergency Medicine

## 2017-04-30 ENCOUNTER — Encounter (HOSPITAL_COMMUNITY): Payer: Self-pay | Admitting: Emergency Medicine

## 2017-04-30 DIAGNOSIS — Z87891 Personal history of nicotine dependence: Secondary | ICD-10-CM | POA: Insufficient documentation

## 2017-04-30 DIAGNOSIS — Z794 Long term (current) use of insulin: Secondary | ICD-10-CM | POA: Diagnosis not present

## 2017-04-30 DIAGNOSIS — R1011 Right upper quadrant pain: Secondary | ICD-10-CM

## 2017-04-30 DIAGNOSIS — E119 Type 2 diabetes mellitus without complications: Secondary | ICD-10-CM | POA: Diagnosis not present

## 2017-04-30 DIAGNOSIS — Z79899 Other long term (current) drug therapy: Secondary | ICD-10-CM | POA: Insufficient documentation

## 2017-04-30 DIAGNOSIS — I10 Essential (primary) hypertension: Secondary | ICD-10-CM | POA: Insufficient documentation

## 2017-04-30 LAB — CBC WITH DIFFERENTIAL/PLATELET
Basophils Absolute: 0 10*3/uL (ref 0.0–0.1)
Basophils Relative: 0 %
EOS PCT: 3 %
Eosinophils Absolute: 0.3 10*3/uL (ref 0.0–0.7)
HEMATOCRIT: 41.5 % (ref 36.0–46.0)
Hemoglobin: 13.6 g/dL (ref 12.0–15.0)
LYMPHS ABS: 3.2 10*3/uL (ref 0.7–4.0)
LYMPHS PCT: 35 %
MCH: 29.1 pg (ref 26.0–34.0)
MCHC: 32.8 g/dL (ref 30.0–36.0)
MCV: 88.9 fL (ref 78.0–100.0)
MONO ABS: 0.7 10*3/uL (ref 0.1–1.0)
MONOS PCT: 8 %
Neutro Abs: 4.9 10*3/uL (ref 1.7–7.7)
Neutrophils Relative %: 54 %
PLATELETS: 319 10*3/uL (ref 150–400)
RBC: 4.67 MIL/uL (ref 3.87–5.11)
RDW: 13.4 % (ref 11.5–15.5)
WBC: 9.2 10*3/uL (ref 4.0–10.5)

## 2017-04-30 MED ORDER — SODIUM CHLORIDE 0.9 % IV BOLUS (SEPSIS)
1000.0000 mL | Freq: Once | INTRAVENOUS | Status: AC
  Administered 2017-04-30: 1000 mL via INTRAVENOUS

## 2017-04-30 MED ORDER — FENTANYL CITRATE (PF) 100 MCG/2ML IJ SOLN
25.0000 ug | Freq: Once | INTRAMUSCULAR | Status: AC
  Administered 2017-04-30: 25 ug via INTRAVENOUS
  Filled 2017-04-30: qty 2

## 2017-04-30 MED ORDER — SODIUM CHLORIDE 0.9 % IV BOLUS (SEPSIS)
500.0000 mL | Freq: Once | INTRAVENOUS | Status: AC
  Administered 2017-04-30: 500 mL via INTRAVENOUS

## 2017-04-30 MED ORDER — ONDANSETRON HCL 4 MG/2ML IJ SOLN
4.0000 mg | Freq: Once | INTRAMUSCULAR | Status: AC
  Administered 2017-04-30: 4 mg via INTRAVENOUS
  Filled 2017-04-30: qty 2

## 2017-04-30 NOTE — ED Triage Notes (Signed)
Pt c/o right lower abd pain with n/v for a week.

## 2017-04-30 NOTE — ED Provider Notes (Signed)
[facility] EMERGENCY DEPARTMENT Provider Note   CSN: [id number] Arrival date & time: 04/30/17  2230  Time seen 23:06 PM   History   Chief Complaint Chief Complaint  Patient presents with  . Abdominal Pain    HPI Kayla Wilkinson is a 46 y.o. female.  HPI patient reports she has been having some right upper quadrant pain off and on for the past few months.  She states however this time the pain has been more persistent Since October 21.  She reports it has been coming and going for a few months.  She feels like her abdomen raises and then it hurts in the right upper quadrant.  She has been having nausea and today she had 2 episodes of vomiting.  She does not relate the pain to food.  She states sometimes the pain radiates into her back.  She does admit to always eating spicy food.  She states the pain is worse with movement and it feels better when she lays still.  She states she feels like the pain is underneath her rib cage.  She thinks she may have had fever although she did not check it.  She has taken Tylenol today.  She had chills today.  She states her urine had a red color in it for the last 3 weeks which she thought was from her period.  She states she was started on Steglatro about 2 months ago and it has really helped control her diabetes.  She also states she had some elevation of her liver test about 3 weeks ago.  Family history there is no family history of gallstones in her female relatives.  PCP Cory Munch, PA-C    Past Medical History:  Diagnosis Date  . Acid reflux   . Diabetes mellitus   . Hypertension     Patient Active Problem List   Diagnosis Date Noted  . Postop check 07/18/2016  . Diabetes mellitus ([place]) 05/26/2016  . Diabetes mellitus type 2 in obese ([place]) 05/14/2016  . Genital herpes 03/31/2016  . Rectocele 03/24/2016  . Rotator cuff tear, left 07/28/2013  . Pain in joint, shoulder region 06/16/2013  . Muscle weakness (generalized)  06/16/2013  . Bursitis of shoulder region 06/07/2013  . Ankle fracture 08/18/2011  . Ankle fracture, lateral malleolus, closed 05/20/2011    Past Surgical History:  Procedure Laterality Date  . RECTOCELE REPAIR N/A 07/08/2016   Procedure: POSTERIOR REPAIR (RECTOCELE);  Surgeon: Jonnie Kind, MD;  Location: [facility];  Service: Gynecology;  Laterality: N/A;    OB History    Gravida Para Term Preterm AB Living   3 2 2   1      SAB TAB Ectopic Multiple Live Births   1               Home Medications    Prior to Admission medications   Medication Sig Start Date End Date Taking? Authorizing Provider  Ertugliflozin L-PyroglutamicAc (STEGLATRO) 5 MG TABS Take 5 mg by mouth daily.   Yes [provider]  glipiZIDE (GLUCOTROL) 5 MG tablet Take 10 mg by mouth 2 (two) times daily.    Yes [provider]  insulin glargine (LANTUS) 100 UNIT/ML injection Inject 0.3 mLs (30 Units total) into the skin at bedtime. Patient taking differently: Inject 50 Units into the skin at bedtime as needed (for high blood sugar levels).  07/09/16  Yes Jonnie Kind, MD  losartan (COZAAR) 25 MG tablet Take 25 mg  by mouth daily.   Yes [provider]  metFORMIN (GLUCOPHAGE) 1000 MG tablet Take 1,000 mg by mouth 2 (two) times daily with a meal.   Yes [provider]  ranitidine (ZANTAC) 150 MG tablet Take 150 mg by mouth 2 (two) times daily.   Yes [provider]  valACYclovir (VALTREX) 1000 MG tablet Take 1 tablet (1,000 mg total) by mouth 2 (two) times daily. X 10 days, then once daily thereafter Patient taking differently: Take 1,000 mg by mouth daily.  03/24/16  Yes [name], CNM  ondansetron (ZOFRAN) 4 MG tablet Take 1 tablet (4 mg total) by mouth every 8 (eight) hours as needed. 05/01/17   Rolland Porter, MD  traMADol (ULTRAM) 50 MG tablet Take 2 tablets (100 mg total) by mouth every 6 (six) hours as needed. 05/01/17   Rolland Porter, MD    Family History Family  History  Problem Relation Age of Onset  . Diabetes Mother   . Diabetes Maternal Grandmother   . Cancer Other   . Cancer - Cervical Maternal Aunt   . Diabetes Son     Social History Social History  Substance Use Topics  . Smoking status: Former Smoker    Packs/day: 0.25    Years: 1.50    Types: Cigarettes    Quit date: 07/02/2012  . Smokeless tobacco: Never Used  . Alcohol use No  employed   Allergies   Patient has no known allergies.   Review of Systems Review of Systems  All other systems reviewed and are negative.    Physical Exam Updated Vital Signs BP (!) 140/96 (BP Location: Right Arm)   Pulse 99   Temp 97.9 F (36.6 C) (Oral)   Resp 18   LMP 04/05/2017   SpO2 99%   Vital signs normal  Except borderline tachycardia   Physical Exam  Constitutional: She is oriented to person, place, and time. She appears well-developed and well-nourished.  Non-toxic appearance. She does not appear ill. No distress.  HENT:  Head: Normocephalic and atraumatic.  Right Ear: External ear normal.  Left Ear: External ear normal.  Nose: Nose normal. No mucosal edema or rhinorrhea.  Mouth/Throat: Oropharynx is clear and moist and mucous membranes are normal. No dental abscesses or uvula swelling.  Eyes: Pupils are equal, round, and reactive to light. Conjunctivae and EOM are normal.  Neck: Normal range of motion and full passive range of motion without pain. Neck supple.  Cardiovascular: Normal rate, regular rhythm and normal heart sounds.  Exam reveals no gallop and no friction rub.   No murmur heard. Pulmonary/Chest: Effort normal and breath sounds normal. No respiratory distress. She has no wheezes. She has no rhonchi. She has no rales. She exhibits no tenderness and no crepitus.  Abdominal: Soft. Normal appearance and bowel sounds are normal. She exhibits no distension. There is tenderness in the right upper quadrant. There is no rebound and no guarding.    Musculoskeletal:  Normal range of motion. She exhibits no edema or tenderness.  Moves all extremities well.   Neurological: She is alert and oriented to person, place, and time. She has normal strength. No cranial nerve deficit.  Skin: Skin is warm, dry and intact. No rash noted. No erythema. No pallor.  Psychiatric: She has a normal mood and affect. Her speech is normal and behavior is normal. Her mood appears not anxious.  Nursing note and vitals reviewed.    ED Treatments / Results  Labs (all labs ordered  are listed, but only abnormal results are displayed) Results for orders placed or performed during the hospital encounter of 04/30/17  Comprehensive metabolic panel  Result Value Ref Range   Sodium 138 135 - 145 mmol/L   Potassium 3.3 (L) 3.5 - 5.1 mmol/L   Chloride 101 101 - 111 mmol/L   CO2 27 22 - 32 mmol/L   Glucose, Bld 181 (H) 65 - 99 mg/dL   BUN 22 (H) 6 - 20 mg/dL   Creatinine, Ser 0.67 0.44 - 1.00 mg/dL   Calcium 9.1 8.9 - 10.3 mg/dL   Total Protein 7.5 6.5 - 8.1 g/dL   Albumin 3.6 3.5 - 5.0 g/dL   AST 19 15 - 41 U/L   ALT 33 14 - 54 U/L   Alkaline Phosphatase 93 38 - 126 U/L   Total Bilirubin 0.5 0.3 - 1.2 mg/dL   GFR calc non Af Amer >60 >60 mL/min   GFR calc Af Amer >60 >60 mL/min   Anion gap 10 5 - 15  Lipase, blood  Result Value Ref Range   Lipase 33 11 - 51 U/L  CBC with Differential  Result Value Ref Range   WBC 9.2 4.0 - 10.5 K/uL   RBC 4.67 3.87 - 5.11 MIL/uL   Hemoglobin 13.6 12.0 - 15.0 g/dL   HCT 41.5 36.0 - 46.0 %   MCV 88.9 78.0 - 100.0 fL   MCH 29.1 26.0 - 34.0 pg   MCHC 32.8 30.0 - 36.0 g/dL   RDW 13.4 11.5 - 15.5 %   Platelets 319 150 - 400 K/uL   Neutrophils Relative % 54 %   Neutro Abs 4.9 1.7 - 7.7 K/uL   Lymphocytes Relative 35 %   Lymphs Abs 3.2 0.7 - 4.0 K/uL   Monocytes Relative 8 %   Monocytes Absolute 0.7 0.1 - 1.0 K/uL   Eosinophils Relative 3 %   Eosinophils Absolute 0.3 0.0 - 0.7 K/uL   Basophils Relative 0 %   Basophils Absolute 0.0 0.0  - 0.1 K/uL  Urinalysis, Routine w reflex microscopic  Result Value Ref Range   Color, Urine YELLOW YELLOW   APPearance CLEAR CLEAR   Specific Gravity, Urine 1.036 (H) 1.005 - 1.030   pH 6.0 5.0 - 8.0   Glucose, UA >=500 (A) NEGATIVE mg/dL   Hgb urine dipstick SMALL (A) NEGATIVE   Bilirubin Urine NEGATIVE NEGATIVE   Ketones, ur NEGATIVE NEGATIVE mg/dL   Protein, ur NEGATIVE NEGATIVE mg/dL   Nitrite NEGATIVE NEGATIVE   Leukocytes, UA NEGATIVE NEGATIVE   RBC / HPF 0-5 0 - 5 RBC/hpf   WBC, UA 0-5 0 - 5 WBC/hpf   Bacteria, UA NONE SEEN NONE SEEN   Squamous Epithelial / LPF 0-5 (A) NONE SEEN   Mucus PRESENT    Laboratory interpretation all normal concentrated UA with glucouria, mild hyperglycemia    EKG  EKG Interpretation None       Radiology No results found.  Procedures Procedures (including critical care time)  Medications Ordered in ED Medications  sodium chloride 0.9 % bolus 1,000 mL (0 mLs Intravenous Stopped 05/01/17 0125)  sodium chloride 0.9 % bolus 500 mL (0 mLs Intravenous Stopped 05/01/17 0058)  ondansetron (ZOFRAN) injection 4 mg (4 mg Intravenous Given 04/30/17 2346)  fentaNYL (SUBLIMAZE) injection 25 mcg (25 mcg Intravenous Given 04/30/17 2346)     Initial Impression / Assessment and Plan / ED Course  I have reviewed the triage vital signs and the nursing notes.  Pertinent  labs & imaging results that were available during my care of the patient were reviewed by me and considered in my medical decision making (see chart for details).    She was given IV fluids, IV pain and nausea medication.  Laboratory testing was ordered.  Recheck at 1:50 AM patient states she is feeling much better.  We discussed her test results.  I discussed that we could do a CT scan tonight however the best test would be at ultrasound.  She is willing to come back as an outpatient later today.  Her pain is most likely going to be gallbladder related.  Review of the [place] shows patient has gotten 5 controlled prescriptions filled since January 2018, she received #30 oxycodone 5/325 twice in January by her GYN, she has received narcotic cough syrup twice, once in March and the second on October 10, and she received phentermine in August.  Final Clinical Impressions(s) / ED Diagnoses   Final diagnoses:  RUQ pain    New Prescriptions New Prescriptions   ONDANSETRON (ZOFRAN) 4 MG TABLET    Take 1 tablet (4 mg total) by mouth every 8 (eight) hours as needed.   TRAMADOL (ULTRAM) 50 MG TABLET    Take 2 tablets (100 mg total) by mouth every 6 (six) hours as needed.    Plan discharge  Rolland Porter, MD, Barbette Or, MD 05/01/17 [phone]

## 2017-05-01 ENCOUNTER — Encounter (HOSPITAL_COMMUNITY): Payer: Self-pay | Admitting: Internal Medicine

## 2017-05-01 LAB — LIPASE, BLOOD: Lipase: 33 U/L (ref 11–51)

## 2017-05-01 LAB — COMPREHENSIVE METABOLIC PANEL
ALBUMIN: 3.6 g/dL (ref 3.5–5.0)
ALK PHOS: 93 U/L (ref 38–126)
ALT: 33 U/L (ref 14–54)
AST: 19 U/L (ref 15–41)
Anion gap: 10 (ref 5–15)
BILIRUBIN TOTAL: 0.5 mg/dL (ref 0.3–1.2)
BUN: 22 mg/dL — AB (ref 6–20)
CALCIUM: 9.1 mg/dL (ref 8.9–10.3)
CO2: 27 mmol/L (ref 22–32)
CREATININE: 0.67 mg/dL (ref 0.44–1.00)
Chloride: 101 mmol/L (ref 101–111)
GFR calc Af Amer: >60 mL/min (ref >60)
GFR calc non Af Amer: >60 mL/min (ref >60)
GLUCOSE: 181 mg/dL — AB (ref 65–99)
Potassium: 3.3 mmol/L — ABNORMAL LOW (ref 3.5–5.1)
SODIUM: 138 mmol/L (ref 135–145)
Total Protein: 7.5 g/dL (ref 6.5–8.1)

## 2017-05-01 LAB — URINALYSIS, ROUTINE W REFLEX MICROSCOPIC
BACTERIA UA: NONE SEEN
Bilirubin Urine: NEGATIVE
Glucose, UA: >=500 mg/dL — AB
Ketones, ur: NEGATIVE mg/dL
Leukocytes, UA: NEGATIVE
NITRITE: NEGATIVE
PROTEIN: NEGATIVE mg/dL
Specific Gravity, Urine: 1.036 — ABNORMAL HIGH (ref 1.005–1.030)
pH: 6 (ref 5.0–8.0)

## 2017-05-01 MED ORDER — ONDANSETRON HCL 4 MG PO TABS: 4.0000 mg | ORAL_TABLET | Freq: Three times a day (TID) | ORAL | 0 refills | Status: DC | PRN

## 2017-05-01 MED ORDER — TRAMADOL HCL 50 MG PO TABS: 100.0000 mg | ORAL_TABLET | Freq: Four times a day (QID) | ORAL | 0 refills | Status: DC | PRN

## 2017-05-01 NOTE — ED Notes (Signed)
Pt states she does not need to urinate at this time, aware of DO  

## 2017-05-01 NOTE — Discharge Instructions (Signed)
Please call radiology scheduling at [phone] this morning to get an appointment time to have your ultrasound done today. Do not eat or drink for 6 hrs before your test. If you have gallstones, you need to start following the low fat diet.  Call Dr Adline Mango office to discuss having your gallbladder removed. He is a Psychologist, sport and exercise. If you don't have gallstones you can follow up with Mr Collene Mares or a gastroenterologist for further evaluation. If you get the pain again take the tramadol with acetaminophen 1000 mg every 6 hrs as needed for pain. Use the zofran for nausea or vomiting.

## 2017-05-04 ENCOUNTER — Other Ambulatory Visit: Payer: BLUE CROSS/BLUE SHIELD | Admitting: Orthotics

## 2017-05-13 ENCOUNTER — Telehealth: Payer: Self-pay | Admitting: Podiatry

## 2017-05-13 NOTE — Telephone Encounter (Signed)
Orthotics in called cell number and voicemail is full.Marland Kitchen

## 2017-06-01 DIAGNOSIS — B373 Candidiasis of vulva and vagina: Secondary | ICD-10-CM | POA: Diagnosis not present

## 2017-06-01 DIAGNOSIS — Z6837 Body mass index (BMI) 37.0-37.9, adult: Secondary | ICD-10-CM | POA: Diagnosis not present

## 2017-06-01 DIAGNOSIS — E1165 Type 2 diabetes mellitus with hyperglycemia: Secondary | ICD-10-CM | POA: Diagnosis not present

## 2017-06-01 DIAGNOSIS — Z1389 Encounter for screening for other disorder: Secondary | ICD-10-CM | POA: Diagnosis not present

## 2017-06-11 DIAGNOSIS — Z0001 Encounter for general adult medical examination with abnormal findings: Secondary | ICD-10-CM | POA: Diagnosis not present

## 2017-06-11 DIAGNOSIS — E119 Type 2 diabetes mellitus without complications: Secondary | ICD-10-CM | POA: Diagnosis not present

## 2017-06-11 DIAGNOSIS — Z0000 Encounter for general adult medical examination without abnormal findings: Secondary | ICD-10-CM | POA: Diagnosis not present

## 2017-06-11 DIAGNOSIS — Z1389 Encounter for screening for other disorder: Secondary | ICD-10-CM | POA: Diagnosis not present

## 2017-06-11 DIAGNOSIS — Z6837 Body mass index (BMI) 37.0-37.9, adult: Secondary | ICD-10-CM | POA: Diagnosis not present

## 2017-06-19 DIAGNOSIS — J209 Acute bronchitis, unspecified: Secondary | ICD-10-CM | POA: Diagnosis not present

## 2017-06-19 DIAGNOSIS — J069 Acute upper respiratory infection, unspecified: Secondary | ICD-10-CM | POA: Diagnosis not present

## 2017-06-19 DIAGNOSIS — J329 Chronic sinusitis, unspecified: Secondary | ICD-10-CM | POA: Diagnosis not present

## 2017-06-19 DIAGNOSIS — Z1231 Encounter for screening mammogram for malignant neoplasm of breast: Secondary | ICD-10-CM | POA: Diagnosis not present

## 2017-06-24 DIAGNOSIS — R928 Other abnormal and inconclusive findings on diagnostic imaging of breast: Secondary | ICD-10-CM | POA: Diagnosis not present

## 2017-06-24 DIAGNOSIS — N6489 Other specified disorders of breast: Secondary | ICD-10-CM | POA: Diagnosis not present

## 2017-06-26 DIAGNOSIS — J069 Acute upper respiratory infection, unspecified: Secondary | ICD-10-CM | POA: Diagnosis not present

## 2017-06-26 DIAGNOSIS — E782 Mixed hyperlipidemia: Secondary | ICD-10-CM | POA: Diagnosis not present

## 2017-06-26 DIAGNOSIS — K219 Gastro-esophageal reflux disease without esophagitis: Secondary | ICD-10-CM | POA: Diagnosis not present

## 2017-06-26 DIAGNOSIS — Z1389 Encounter for screening for other disorder: Secondary | ICD-10-CM | POA: Diagnosis not present

## 2017-06-26 DIAGNOSIS — Z6837 Body mass index (BMI) 37.0-37.9, adult: Secondary | ICD-10-CM | POA: Diagnosis not present

## 2017-06-26 DIAGNOSIS — I10 Essential (primary) hypertension: Secondary | ICD-10-CM | POA: Diagnosis not present

## 2017-07-15 ENCOUNTER — Ambulatory Visit (INDEPENDENT_AMBULATORY_CARE_PROVIDER_SITE_OTHER): Payer: BLUE CROSS/BLUE SHIELD | Admitting: "Endocrinology

## 2017-07-15 ENCOUNTER — Encounter: Payer: Self-pay | Admitting: "Endocrinology

## 2017-07-15 VITALS — BP 136/84 | HR 107 | Ht 63.0 in | Wt 218.0 lb

## 2017-07-15 DIAGNOSIS — I10 Essential (primary) hypertension: Secondary | ICD-10-CM | POA: Diagnosis not present

## 2017-07-15 DIAGNOSIS — E1165 Type 2 diabetes mellitus with hyperglycemia: Secondary | ICD-10-CM | POA: Diagnosis not present

## 2017-07-15 DIAGNOSIS — Z6838 Body mass index (BMI) 38.0-38.9, adult: Secondary | ICD-10-CM

## 2017-07-15 MED ORDER — INSULIN GLARGINE 100 UNIT/ML SOLOSTAR PEN: 50.0000 [IU] | PEN_INJECTOR | Freq: Every day | SUBCUTANEOUS | 2 refills | Status: DC

## 2017-07-15 NOTE — Progress Notes (Signed)
Consult Note       07/15/2017, 10:33 AM   Subjective:    Patient ID: Kayla Wilkinson, female    DOB: Dec 12, 1970.  Kayla Wilkinson is being seen in consultation for management of currently uncontrolled symptomatic diabetes requested by  Cory Munch, PA-C.   Past Medical History:  Diagnosis Date  . Acid reflux   . Diabetes mellitus   . Hypertension    Past Surgical History:  Procedure Laterality Date  . RECTOCELE REPAIR N/A 07/08/2016   Procedure: POSTERIOR REPAIR (RECTOCELE);  Surgeon: Jonnie Kind, MD;  Location: [facility];  Service: Gynecology;  Laterality: N/A;   Social History   Socioeconomic History  . Marital status: Married    Spouse name: None  . Number of children: None  . Years of education: None  . Highest education level: None  Social Needs  . Financial resource strain: None  . Food insecurity - worry: None  . Food insecurity - inability: None  . Transportation needs - medical: None  . Transportation needs - non-medical: None  Occupational History  . None  Tobacco Use  . Smoking status: Former Smoker    Packs/day: 0.25    Years: 1.50    Pack years: 0.37    Types: Cigarettes    Last attempt to quit: 07/02/2012    Years since quitting: 5.0  . Smokeless tobacco: Never Used  Substance and Sexual Activity  . Alcohol use: Yes    Comment: Occasional  . Drug use: No  . Sexual activity: Not Currently    Birth control/protection: None  Other Topics Concern  . None  Social History Narrative  . None   Outpatient Encounter Medications as of 07/15/2017  Medication Sig  . Insulin Glargine (LANTUS SOLOSTAR) 100 UNIT/ML Solostar Pen Inject 50 Units into the skin daily at 10 pm.  . losartan (COZAAR) 25 MG tablet Take 25 mg by mouth daily.  . metFORMIN (GLUCOPHAGE) 1000 MG tablet Take 1,000 mg by mouth 2 (two) times daily with a meal.  . ondansetron (ZOFRAN) 4 MG  tablet Take 1 tablet (4 mg total) by mouth every 8 (eight) hours as needed.  . ranitidine (ZANTAC) 150 MG tablet Take 150 mg by mouth 2 (two) times daily.  . traMADol (ULTRAM) 50 MG tablet Take 2 tablets (100 mg total) by mouth every 6 (six) hours as needed.  . valACYclovir (VALTREX) 1000 MG tablet Take 1 tablet (1,000 mg total) by mouth 2 (two) times daily. X 10 days, then once daily thereafter (Patient taking differently: Take 1,000 mg by mouth daily. )  . [DISCONTINUED] Ertugliflozin L-PyroglutamicAc (STEGLATRO) 5 MG TABS Take 5 mg by mouth daily.  . [DISCONTINUED] glipiZIDE (GLUCOTROL) 5 MG tablet Take 10 mg by mouth 2 (two) times daily.   . [DISCONTINUED] insulin glargine (LANTUS) 100 UNIT/ML injection Inject 0.3 mLs (30 Units total) into the skin at bedtime. (Patient taking differently: Inject 50 Units into the skin at bedtime as needed (for high blood sugar levels). )   No facility-administered encounter medications on file as of 07/15/2017.     ALLERGIES: No Known Allergies  VACCINATION  STATUS:  There is no immunization history on file for this patient.  Diabetes  She presents for her initial diabetic visit. She has type 2 diabetes mellitus. Onset time: She was diagnosed at approximate age of 62 years. Her disease course has been worsening. There are no hypoglycemic associated symptoms. Pertinent negatives for hypoglycemia include no confusion, headaches, pallor or seizures. Associated symptoms include fatigue, polydipsia and polyuria. Pertinent negatives for diabetes include no chest pain and no polyphagia. There are no hypoglycemic complications. Symptoms are worsening. There are no diabetic complications. Risk factors for coronary artery disease include diabetes mellitus, hypertension, obesity, sedentary lifestyle, tobacco exposure and family history. Current diabetic treatment includes insulin injections and oral agent (triple therapy). Her weight is stable. She is following a generally  unhealthy diet. When asked about meal planning, she reported none. She has not had a previous visit with a dietitian. She never participates in exercise. (She did not bring any meter nor logs to review today. Her A1c from August 2018 was 12%. She verbally reports a better A1c of 7.6% from December 2018, not available to review today.) An ACE inhibitor/angiotensin II receptor blocker is being taken. She does not see a podiatrist.Eye exam is not current.  Hypertension  This is a chronic problem. The current episode started more than 1 year ago. Pertinent negatives include no chest pain, headaches, palpitations or shortness of breath. Risk factors for coronary artery disease include diabetes mellitus, sedentary lifestyle, smoking/tobacco exposure, obesity and family history. Past treatments include angiotensin blockers.      Review of Systems  Constitutional: Positive for fatigue. Negative for chills, fever and unexpected weight change.  HENT: Negative for trouble swallowing and voice change.   Eyes: Negative for visual disturbance.  Respiratory: Negative for cough, shortness of breath and wheezing.   Cardiovascular: Negative for chest pain, palpitations and leg swelling.  Gastrointestinal: Negative for diarrhea, nausea and vomiting.  Endocrine: Positive for polydipsia and polyuria. Negative for cold intolerance, heat intolerance and polyphagia.  Musculoskeletal: Negative for arthralgias and myalgias.  Skin: Negative for color change, pallor, rash and wound.  Neurological: Negative for seizures and headaches.  Psychiatric/Behavioral: Negative for confusion and suicidal ideas.    Objective:    BP 136/84   Pulse (!) 107   Ht 5\' 3"  (1.6 m)   Wt 218 lb (98.9 kg)   BMI 38.62 kg/m   Wt Readings from Last 3 Encounters:  07/15/17 218 lb (98.9 kg)  09/05/16 224 lb (101.6 kg)  08/15/16 221 lb 3.2 oz (100.3 kg)     Physical Exam  Constitutional: She is oriented to person, place, and time. She  appears well-developed.  HENT:  Head: Normocephalic and atraumatic.  Eyes: EOM are normal.  Neck: Normal range of motion. Neck supple. No tracheal deviation present. No thyromegaly present.  Cardiovascular: Normal rate and regular rhythm.  Pulmonary/Chest: Effort normal and breath sounds normal.  Abdominal: Soft. Bowel sounds are normal. There is no tenderness. There is no guarding.  Musculoskeletal: Normal range of motion. She exhibits no edema.  Neurological: She is alert and oriented to person, place, and time. She has normal reflexes. No cranial nerve deficit. Coordination normal.  Skin: Skin is warm and dry. No rash noted. No erythema. No pallor.  Psychiatric: She has a normal mood and affect. Judgment normal.    CMP ( most recent) CMP     Component Value Date/Time   NA 138 04/30/2017 2340   NA 135 05/09/2016 1014   K 3.3 (  L) 04/30/2017 2340   CL 101 04/30/2017 2340   CO2 27 04/30/2017 2340   GLUCOSE 181 (H) 04/30/2017 2340   BUN 22 (H) 04/30/2017 2340   BUN 16 05/09/2016 1014   CREATININE 0.67 04/30/2017 2340   CALCIUM 9.1 04/30/2017 2340   PROT 7.5 04/30/2017 2340   PROT 7.2 05/09/2016 1014   ALBUMIN 3.6 04/30/2017 2340   ALBUMIN 4.2 05/09/2016 1014   AST 19 04/30/2017 2340   ALT 33 04/30/2017 2340   ALKPHOS 93 04/30/2017 2340   BILITOT 0.5 04/30/2017 2340   BILITOT <0.2 05/09/2016 1014   GFRNONAA >60 04/30/2017 2340   GFRAA >60 04/30/2017 2340     Diabetic Labs (most recent): Lab Results  Component Value Date   HGBA1C 12.2 (H) 07/02/2016   HGBA1C 12.5 (H) 05/09/2016    Lab Results  Component Value Date   TSH 1.835 09/05/2016   TSH 1.190 07/24/2015       Assessment & Plan:   1. Uncontrolled type 2 diabetes mellitus with hyperglycemia ([place])  - Kayla Wilkinson has currently uncontrolled symptomatic type 2 DM since  47 years of age. - Her A1c was 12% from August 2018, recent labs are not available to review. She reports she had a better A1c  of 7.6% in December 2018. We'll request for a copy of her labs from her PMD.  -her diabetes is complicated by obesity/sedentary life, history of heavy smoking and Kayla Wilkinson remains at a high risk for more acute and chronic complications which include CAD, CVA, CKD, retinopathy, and neuropathy. These are all discussed in detail with the patient.  - I have counseled her on diet management and weight loss, by adopting a carbohydrate restricted/protein rich diet.  - Suggestion is made for her to avoid simple carbohydrates  from her diet including Cakes, Sweet Desserts, Ice Cream, Soda (diet and regular), Sweet Tea, Candies, Chips, Cookies, Store Bought Juices, Alcohol in Excess of  1-2 drinks a day, Artificial Sweeteners, and "Sugar-free" Products. This will help patient to have stable blood glucose profile and potentially avoid unintended weight gain.  - I encouraged her to switch to  unprocessed or minimally processed complex starch and increased protein intake (animal or plant source), fruits, and vegetables.  - she is advised to stick to a routine mealtimes to eat 3 meals  a day and avoid unnecessary snacks ( to snack only to correct hypoglycemia).   - she will be scheduled with Jearld Fenton, RDN, CDE for individualized diabetes education.  - I have approached her with the following individualized plan to manage diabetes and patient agrees:   -  Given her current glycemic burden, she may require intensive treatment with basal/bolus insulin.  - In preparation, I have advised her to initiate strict monitoring of glucose 4 times a day-before meals and at bedtime and return in one week with her meter and logs. - In the meantime, I advised her to continue Lantus 50 units daily at bedtime.  -Patient is encouraged to call clinic for blood glucose levels less than 70 or above 300 mg /dl. - I will continue metformin 1000 g by mouth twice a day, therapeutically suitable for patient. - I  will discontinue glipizide and steglatro, risk outweighs benefit for this patient.  - she will be considered for incretin therapy as appropriate next visit. - Patient specific target  A1c;  LDL, HDL, Triglycerides, and  Waist Circumference were discussed in detail.  2) BP/HTN: Controlled. Continue current medications  including ACEI/ARB. 3) Lipids/HPL:   Lipid panel unknown.   Patient is not on  Statins. She'll be considered for fasting lipid panel on subsequent visits 4)  Weight/Diet: CDE Consult will be initiated , exercise, and detailed carbohydrates information provided.  5) Chronic Care/Health Maintenance:  -she  is on ARB  medications and  is encouraged to continue to follow up with Ophthalmology, Dentist,  Podiatrist at least yearly or according to recommendations, and advised to  stay away from smoking. I have recommended yearly flu vaccine and pneumonia vaccination at least every 5 years; moderate intensity exercise for up to 150 minutes weekly; and  sleep for at least 7 hours a day.  - I advised patient to maintain close follow up with Cory Munch, PA-C for primary care needs.  - Time spent with the patient: 1 hour, of which >50% was spent in obtaining information about her symptoms, reviewing her previous labs, evaluations, and treatments, counseling her about her   currently uncontrolled type 2 diabetes, hypertension, and developing a plan for long term treatment; her  questions were answered to her satisfaction.  Follow up plan: - Return in about 1 week (around 07/22/2017) for follow up with meter and logs- no labs.  Glade Lloyd, MD [facility] [address] [place], [place] [address] Phone: [phone]  Fax: [phone]    07/15/2017, 10:33 AM  This note was partially dictated with voice recognition software. Similar sounding words can be transcribed inadequately or may not  be corrected upon review.

## 2017-07-15 NOTE — Patient Instructions (Signed)

## 2017-07-22 ENCOUNTER — Ambulatory Visit: Payer: BLUE CROSS/BLUE SHIELD | Admitting: "Endocrinology

## 2017-07-31 DIAGNOSIS — H60501 Unspecified acute noninfective otitis externa, right ear: Secondary | ICD-10-CM | POA: Diagnosis not present

## 2017-07-31 DIAGNOSIS — H6691 Otitis media, unspecified, right ear: Secondary | ICD-10-CM | POA: Diagnosis not present

## 2017-07-31 DIAGNOSIS — J329 Chronic sinusitis, unspecified: Secondary | ICD-10-CM | POA: Diagnosis not present

## 2017-08-03 ENCOUNTER — Ambulatory Visit (INDEPENDENT_AMBULATORY_CARE_PROVIDER_SITE_OTHER): Payer: BLUE CROSS/BLUE SHIELD | Admitting: "Endocrinology

## 2017-08-03 ENCOUNTER — Encounter: Payer: Self-pay | Admitting: "Endocrinology

## 2017-08-03 VITALS — BP 144/91 | HR 97 | Ht 63.0 in | Wt 209.0 lb

## 2017-08-03 DIAGNOSIS — Z9119 Patient's noncompliance with other medical treatment and regimen: Secondary | ICD-10-CM | POA: Diagnosis not present

## 2017-08-03 DIAGNOSIS — Z91199 Patient's noncompliance with other medical treatment and regimen due to unspecified reason: Secondary | ICD-10-CM | POA: Insufficient documentation

## 2017-08-03 DIAGNOSIS — Z6838 Body mass index (BMI) 38.0-38.9, adult: Secondary | ICD-10-CM

## 2017-08-03 DIAGNOSIS — E1165 Type 2 diabetes mellitus with hyperglycemia: Secondary | ICD-10-CM | POA: Diagnosis not present

## 2017-08-03 NOTE — Patient Instructions (Signed)

## 2017-08-03 NOTE — Progress Notes (Signed)
Consult Note       08/03/2017, 11:00 AM   Subjective:    Patient ID: Kayla Wilkinson, female    DOB: 1970-08-05.  Kayla Wilkinson is being seen in follow-up for management of currently uncontrolled symptomatic diabetes requested by  Cory Munch, PA-C.   Past Medical History:  Diagnosis Date  . Acid reflux   . Diabetes mellitus   . Hypertension    Past Surgical History:  Procedure Laterality Date  . RECTOCELE REPAIR N/A 07/08/2016   Procedure: POSTERIOR REPAIR (RECTOCELE);  Surgeon: Jonnie Kind, MD;  Location: [facility];  Service: Gynecology;  Laterality: N/A;   Social History   Socioeconomic History  . Marital status: Married    Spouse name: None  . Number of children: None  . Years of education: None  . Highest education level: None  Social Needs  . Financial resource strain: None  . Food insecurity - worry: None  . Food insecurity - inability: None  . Transportation needs - medical: None  . Transportation needs - non-medical: None  Occupational History  . None  Tobacco Use  . Smoking status: Former Smoker    Packs/day: 0.25    Years: 1.50    Pack years: 0.37    Types: Cigarettes    Last attempt to quit: 07/02/2012    Years since quitting: 5.0  . Smokeless tobacco: Never Used  Substance and Sexual Activity  . Alcohol use: Yes    Comment: Occasional  . Drug use: No  . Sexual activity: Not Currently    Birth control/protection: None  Other Topics Concern  . None  Social History Narrative  . None   Outpatient Encounter Medications as of 08/03/2017  Medication Sig  . glipiZIDE (GLUCOTROL) 5 MG tablet Take by mouth daily.  . Insulin Glargine (LANTUS SOLOSTAR) 100 UNIT/ML Solostar Pen Inject 50 Units into the skin daily at 10 pm. (Patient taking differently: Inject 60 Units into the skin daily at 10 pm. )  . losartan (COZAAR) 25 MG tablet Take 25 mg by mouth  daily.  . metFORMIN (GLUCOPHAGE) 1000 MG tablet Take 1,000 mg by mouth 2 (two) times daily with a meal.  . ondansetron (ZOFRAN) 4 MG tablet Take 1 tablet (4 mg total) by mouth every 8 (eight) hours as needed.  . ranitidine (ZANTAC) 150 MG tablet Take 150 mg by mouth 2 (two) times daily.  . traMADol (ULTRAM) 50 MG tablet Take 2 tablets (100 mg total) by mouth every 6 (six) hours as needed.  . valACYclovir (VALTREX) 1000 MG tablet Take 1 tablet (1,000 mg total) by mouth 2 (two) times daily. X 10 days, then once daily thereafter (Patient taking differently: Take 1,000 mg by mouth daily. )   No facility-administered encounter medications on file as of 08/03/2017.     ALLERGIES: No Known Allergies  VACCINATION STATUS:  There is no immunization history on file for this patient.  Diabetes  She presents for her follow-up diabetic visit. She has type 2 diabetes mellitus. Onset time: She was diagnosed at approximate age of 84 years. Her disease course has been worsening. There are  no hypoglycemic associated symptoms. Pertinent negatives for hypoglycemia include no confusion, headaches, pallor or seizures. Associated symptoms include fatigue, polydipsia and polyuria. Pertinent negatives for diabetes include no chest pain and no polyphagia. There are no hypoglycemic complications. Symptoms are worsening. There are no diabetic complications. Risk factors for coronary artery disease include diabetes mellitus, hypertension, obesity, sedentary lifestyle, tobacco exposure and family history. Current diabetic treatment includes insulin injections and oral agent (triple therapy). Her weight is decreasing steadily. She is following a generally unhealthy diet. When asked about meal planning, she reported none. She has not had a previous visit with a dietitian. She never participates in exercise. Her overall blood glucose range is >200 mg/dl. (She did  bring a meter  showing completely random fingerstick readings  averaging still above 200 mg/dL. She was asked to return with blood glucose readings 4 times a day on a log sheet. Her A1c from August 2018 was 12%.  She verbally reports a better A1c of 7.6% from December 2018, not available to review today.) An ACE inhibitor/angiotensin II receptor blocker is being taken. She does not see a podiatrist.Eye exam is not current.  Hypertension  This is a chronic problem. The current episode started more than 1 year ago. Pertinent negatives include no chest pain, headaches, palpitations or shortness of breath. Risk factors for coronary artery disease include diabetes mellitus, sedentary lifestyle, smoking/tobacco exposure, obesity and family history. Past treatments include angiotensin blockers.    Review of Systems  Constitutional: Positive for fatigue. Negative for chills, fever and unexpected weight change.  HENT: Negative for trouble swallowing and voice change.   Eyes: Negative for visual disturbance.  Respiratory: Negative for cough, shortness of breath and wheezing.   Cardiovascular: Negative for chest pain, palpitations and leg swelling.  Gastrointestinal: Negative for diarrhea, nausea and vomiting.  Endocrine: Positive for polydipsia and polyuria. Negative for cold intolerance, heat intolerance and polyphagia.  Musculoskeletal: Negative for arthralgias and myalgias.  Skin: Negative for color change, pallor, rash and wound.  Neurological: Negative for seizures and headaches.  Psychiatric/Behavioral: Negative for confusion and suicidal ideas.    Objective:    There were no vitals taken for this visit.  Wt Readings from Last 3 Encounters:  07/15/17 218 lb (98.9 kg)  09/05/16 224 lb (101.6 kg)  08/15/16 221 lb 3.2 oz (100.3 kg)     Physical Exam  Constitutional: She is oriented to person, place, and time. She appears well-developed.  HENT:  Head: Normocephalic and atraumatic.  Eyes: EOM are normal.  Neck: Normal range of motion. Neck supple. No  tracheal deviation present. No thyromegaly present.  Cardiovascular: Normal rate and regular rhythm.  Pulmonary/Chest: Effort normal and breath sounds normal.  Abdominal: Soft. Bowel sounds are normal. There is no tenderness. There is no guarding.  Musculoskeletal: Normal range of motion. She exhibits no edema.  Neurological: She is alert and oriented to person, place, and time. She has normal reflexes. No cranial nerve deficit. Coordination normal.  Skin: Skin is warm and dry. No rash noted. No erythema. No pallor.  Psychiatric: She has a normal mood and affect. Judgment normal.    CMP ( most recent) CMP     Component Value Date/Time   NA 138 04/30/2017 2340   NA 135 05/09/2016 1014   K 3.3 (L) 04/30/2017 2340   CL 101 04/30/2017 2340   CO2 27 04/30/2017 2340   GLUCOSE 181 (H) 04/30/2017 2340   BUN 22 (H) 04/30/2017 2340   BUN 16 05/09/2016 1014  CREATININE 0.67 04/30/2017 2340   CALCIUM 9.1 04/30/2017 2340   PROT 7.5 04/30/2017 2340   PROT 7.2 05/09/2016 1014   ALBUMIN 3.6 04/30/2017 2340   ALBUMIN 4.2 05/09/2016 1014   AST 19 04/30/2017 2340   ALT 33 04/30/2017 2340   ALKPHOS 93 04/30/2017 2340   BILITOT 0.5 04/30/2017 2340   BILITOT <0.2 05/09/2016 1014   GFRNONAA >60 04/30/2017 2340   GFRAA >60 04/30/2017 2340     Diabetic Labs (most recent): Lab Results  Component Value Date   HGBA1C 12 02/04/2017   HGBA1C 12.2 (H) 07/02/2016   HGBA1C 12.5 (H) 05/09/2016    Lab Results  Component Value Date   TSH 1.835 09/05/2016   TSH 1.190 07/24/2015       Assessment & Plan:   1. Uncontrolled type 2 diabetes mellitus with hyperglycemia ([place])  - Kayla Wilkinson has currently uncontrolled symptomatic type 2 DM since  47 years of age. - Her A1c was 12% from August 2018, recent labs are not available to review. She reports she had a better A1c of 7.6% in December 2018.  - Request of her recent labs did not reveal this improving A1c.   -her diabetes is  complicated by obesity/sedentary life, history of heavy smoking and Kayla Wilkinson remains at a high risk for more acute and chronic complications which include CAD, CVA, CKD, retinopathy, and neuropathy. These are all discussed in detail with the patient.  - I have counseled her on diet management and weight loss, by adopting a carbohydrate restricted/protein rich diet.  - Suggestion is made for her to avoid simple carbohydrates  from her diet including Cakes, Sweet Desserts, Ice Cream, Soda (diet and regular), Sweet Tea, Candies, Chips, Cookies, Store Bought Juices, Alcohol in Excess of  1-2 drinks a day, Artificial Sweeteners, and "Sugar-free" Products. This will help patient to have stable blood glucose profile and potentially avoid unintended weight gain.  - I encouraged her to switch to  unprocessed or minimally processed complex starch and increased protein intake (animal or plant source), fruits, and vegetables.  - she is advised to stick to a routine mealtimes to eat 3 meals  a day and avoid unnecessary snacks ( to snack only to correct hypoglycemia).   - She is currently on Lantus 60 units daily at bedtime, metformin 1000 mg by mouth twice a day, and glipizide 5 mg by mouth daily. - She clearly needs more treatment, ideally with basal/bolus insulin. This requires for her to stay committed for proper monitoring of blood glucose for safe use of insulin. - Unfortunately she did not display appropriate commitment for monitoring of blood glucose to reasonably adjust her treatment. She came with a log showing suspicious blood glucose readings not verifiable in her meter. - When she was asked about discrepancy of blood glucose on her logs and in her meter she became belligerent and started to curse, and decided to leave clinic before she was given a long-term plan for diabetes treatment, she did not make any follow-up appointment. - She did not give me a chance to give her next option of  therapy.  - Patient specific target  A1c;  LDL, HDL, Triglycerides, and  Waist Circumference were discussed in detail.  2) BP/HTN:  Her blood pressure is uncontrolled . She is on medications including ACEI/ARB. 3) Lipids/HPL:   Lipid panel unknown.   Patient is not on  Statins. 4)  Weight/Diet: CDE Consult will be initiated , exercise, and detailed  carbohydrates information provided.  5) Chronic Care/Health Maintenance:  -she  is on ARB  medications and  is encouraged to continue to follow up with Ophthalmology, Dentist,  Podiatrist at least yearly or according to recommendations, and advised to  stay away from smoking. I have recommended yearly flu vaccine and pneumonia vaccination at least every 5 years; moderate intensity exercise for up to 150 minutes weekly; and  sleep for at least 7 hours a day.  Patient left cursing AMA.  Follow up plan: - No Follow-up on file.  Glade Lloyd, MD [facility] [address] [place], [place] [address] Phone: [phone]  Fax: [phone]    08/03/2017, 11:00 AM  This note was partially dictated with voice recognition software. Similar sounding words can be transcribed inadequately or may not  be corrected upon review.

## 2017-08-05 DIAGNOSIS — Z6837 Body mass index (BMI) 37.0-37.9, adult: Secondary | ICD-10-CM | POA: Diagnosis not present

## 2017-08-05 DIAGNOSIS — E119 Type 2 diabetes mellitus without complications: Secondary | ICD-10-CM | POA: Diagnosis not present

## 2017-09-28 DIAGNOSIS — Z6836 Body mass index (BMI) 36.0-36.9, adult: Secondary | ICD-10-CM | POA: Diagnosis not present

## 2017-09-28 DIAGNOSIS — F410 Panic disorder [episodic paroxysmal anxiety] without agoraphobia: Secondary | ICD-10-CM | POA: Diagnosis not present

## 2017-09-28 DIAGNOSIS — E6609 Other obesity due to excess calories: Secondary | ICD-10-CM | POA: Diagnosis not present

## 2017-09-28 DIAGNOSIS — F321 Major depressive disorder, single episode, moderate: Secondary | ICD-10-CM | POA: Diagnosis not present

## 2017-12-02 ENCOUNTER — Ambulatory Visit: Payer: BLUE CROSS/BLUE SHIELD | Admitting: Obstetrics and Gynecology

## 2018-02-25 DIAGNOSIS — E119 Type 2 diabetes mellitus without complications: Secondary | ICD-10-CM | POA: Diagnosis not present

## 2018-02-25 DIAGNOSIS — Z6836 Body mass index (BMI) 36.0-36.9, adult: Secondary | ICD-10-CM | POA: Diagnosis not present

## 2018-02-25 DIAGNOSIS — E6609 Other obesity due to excess calories: Secondary | ICD-10-CM | POA: Diagnosis not present

## 2018-02-25 DIAGNOSIS — Z1389 Encounter for screening for other disorder: Secondary | ICD-10-CM | POA: Diagnosis not present

## 2018-04-28 DIAGNOSIS — R928 Other abnormal and inconclusive findings on diagnostic imaging of breast: Secondary | ICD-10-CM | POA: Diagnosis not present

## 2018-04-28 DIAGNOSIS — N6489 Other specified disorders of breast: Secondary | ICD-10-CM | POA: Diagnosis not present

## 2018-04-28 DIAGNOSIS — N6312 Unspecified lump in the right breast, upper inner quadrant: Secondary | ICD-10-CM | POA: Diagnosis not present

## 2018-04-28 DIAGNOSIS — N6314 Unspecified lump in the right breast, lower inner quadrant: Secondary | ICD-10-CM | POA: Diagnosis not present

## 2018-05-10 DIAGNOSIS — Z1151 Encounter for screening for human papillomavirus (HPV): Secondary | ICD-10-CM | POA: Diagnosis not present

## 2018-05-10 DIAGNOSIS — Z01419 Encounter for gynecological examination (general) (routine) without abnormal findings: Secondary | ICD-10-CM | POA: Diagnosis not present

## 2018-05-10 DIAGNOSIS — Z6835 Body mass index (BMI) 35.0-35.9, adult: Secondary | ICD-10-CM | POA: Diagnosis not present

## 2018-05-12 DIAGNOSIS — R928 Other abnormal and inconclusive findings on diagnostic imaging of breast: Secondary | ICD-10-CM | POA: Diagnosis not present

## 2018-05-12 DIAGNOSIS — N62 Hypertrophy of breast: Secondary | ICD-10-CM | POA: Diagnosis not present

## 2018-05-12 DIAGNOSIS — N6081 Other benign mammary dysplasias of right breast: Secondary | ICD-10-CM | POA: Diagnosis not present

## 2018-05-12 DIAGNOSIS — N6011 Diffuse cystic mastopathy of right breast: Secondary | ICD-10-CM | POA: Diagnosis not present

## 2018-05-12 DIAGNOSIS — N6314 Unspecified lump in the right breast, lower inner quadrant: Secondary | ICD-10-CM | POA: Diagnosis not present

## 2018-05-17 ENCOUNTER — Ambulatory Visit: Payer: BLUE CROSS/BLUE SHIELD | Admitting: Orthopedic Surgery

## 2018-05-26 ENCOUNTER — Encounter: Payer: Self-pay | Admitting: Orthopedic Surgery

## 2018-05-26 ENCOUNTER — Ambulatory Visit (INDEPENDENT_AMBULATORY_CARE_PROVIDER_SITE_OTHER): Payer: BLUE CROSS/BLUE SHIELD | Admitting: Orthopedic Surgery

## 2018-05-26 ENCOUNTER — Ambulatory Visit (INDEPENDENT_AMBULATORY_CARE_PROVIDER_SITE_OTHER): Payer: BLUE CROSS/BLUE SHIELD

## 2018-05-26 VITALS — BP 142/87 | HR 100 | Ht 63.0 in | Wt 200.0 lb

## 2018-05-26 DIAGNOSIS — G8929 Other chronic pain: Secondary | ICD-10-CM

## 2018-05-26 DIAGNOSIS — M25512 Pain in left shoulder: Secondary | ICD-10-CM | POA: Diagnosis not present

## 2018-05-26 DIAGNOSIS — M75102 Unspecified rotator cuff tear or rupture of left shoulder, not specified as traumatic: Secondary | ICD-10-CM
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
## 2018-05-26 NOTE — Patient Instructions (Addendum)
Physical therapy has been ordered for you at [facility] [phone] is the phone number to call if you want to call to schedule. Shoulder Impingement Syndrome Shoulder impingement syndrome is a condition that causes pain when connective tissues (tendons) surrounding the shoulder joint become pinched. These tendons are part of the group of muscles and tissues that help to stabilize the shoulder (rotator cuff). Beneath the rotator cuff is a fluid-filled sac (bursa) that allows the muscles and tendons to glide smoothly. The bursa may become swollen or irritated (bursitis). Bursitis, swelling in the rotator cuff tendons, or both conditions can decrease how much space is under a bone in the shoulder joint (acromion), resulting in impingement. What are the causes? Shoulder impingement syndrome can be caused by bursitis or swelling of the rotator cuff tendons, which may result from:  Repetitive overhead arm movements.  Falling onto the shoulder.  Weakness in the shoulder muscles.  What increases the risk? You may be more likely to develop this condition if you are an athlete who participates in:  Sports that involve throwing, such as baseball.  Tennis.  Swimming.  Volleyball.  Some people are also more likely to develop impingement syndrome because of the shape of their acromion bone. What are the signs or symptoms? The main symptom of this condition is pain on the front or side of the shoulder. Pain may:  Get worse when lifting or raising the arm.  Get worse at night.  Wake you up from sleeping.  Feel sharp when the shoulder is moved, and then fade to an ache.  Other signs and symptoms may include:  Tenderness.  Stiffness.  Inability to raise the arm above shoulder level or behind the body.  Weakness.  How is this diagnosed? This condition may be diagnosed based on:  Your symptoms.  Your medical history.  A physical exam.  Imaging tests, such  as: ? X-rays. ? MRI. ? Ultrasound.  How is this treated? Treatment for this condition may include:  Resting your shoulder and avoiding all activities that cause pain or put stress on the shoulder.  Icing your shoulder.  NSAIDs to help reduce pain and swelling.  One or more injections of medicines to numb the area and reduce inflammation.  Physical therapy.  Surgery. This may be needed if nonsurgical treatments have not helped. Surgery may involve repairing the rotator cuff, reshaping the acromion, or removing the bursa.  Follow these instructions at home: Managing pain, stiffness, and swelling  If directed, apply ice to the injured area. ? Put ice in a plastic bag. ? Place a towel between your skin and the bag. ? Leave the ice on for 20 minutes, 2-3 times a day. Activity  Rest and return to your normal activities as told by your health care provider. Ask your health care provider what activities are safe for you.  Do exercises as told by your health care provider. General instructions  Do not use any tobacco products, including cigarettes, chewing tobacco, or e-cigarettes. Tobacco can delay healing. If you need help quitting, ask your health care provider.  Ask your health care provider when it is safe for you to drive.  Take over-the-counter and prescription medicines only as told by your health care provider.  Keep all follow-up visits as told by your health care provider. This is important. How is this prevented?  Give your body time to rest between periods of activity.  Be safe and responsible while being active to avoid falls.  Maintain physical fitness, including strength and flexibility. Contact a health care provider if:  Your symptoms have not improved after 1-2 months of treatment and rest.  You cannot lift your arm away from your body. This information is not intended to replace advice given to you by your health care provider. Make sure you discuss any  questions you have with your health care provider. Document Released: 06/23/2005 Document Revised: 02/28/2016 Document Reviewed: 05/26/2015 Elsevier Interactive Patient Education  [employer].

## 2018-06-15 DIAGNOSIS — Z6835 Body mass index (BMI) 35.0-35.9, adult: Secondary | ICD-10-CM | POA: Diagnosis not present

## 2018-06-15 DIAGNOSIS — E6609 Other obesity due to excess calories: Secondary | ICD-10-CM | POA: Diagnosis not present

## 2018-06-15 DIAGNOSIS — Z1389 Encounter for screening for other disorder: Secondary | ICD-10-CM | POA: Diagnosis not present

## 2018-06-15 DIAGNOSIS — E1165 Type 2 diabetes mellitus with hyperglycemia: Secondary | ICD-10-CM | POA: Diagnosis not present

## 2018-06-17 DIAGNOSIS — Z1389 Encounter for screening for other disorder: Secondary | ICD-10-CM | POA: Diagnosis not present

## 2018-06-17 DIAGNOSIS — Z6835 Body mass index (BMI) 35.0-35.9, adult: Secondary | ICD-10-CM | POA: Diagnosis not present

## 2018-06-17 DIAGNOSIS — E6609 Other obesity due to excess calories: Secondary | ICD-10-CM | POA: Diagnosis not present

## 2018-06-17 DIAGNOSIS — Z0000 Encounter for general adult medical examination without abnormal findings: Secondary | ICD-10-CM | POA: Diagnosis not present

## 2018-07-07 HISTORY — PX: CARDIAC CATHETERIZATION: SHX172

## 2018-07-09 ENCOUNTER — Ambulatory Visit (INDEPENDENT_AMBULATORY_CARE_PROVIDER_SITE_OTHER): Payer: BLUE CROSS/BLUE SHIELD

## 2018-07-09 ENCOUNTER — Encounter (HOSPITAL_COMMUNITY): Payer: Self-pay | Admitting: Emergency Medicine

## 2018-07-09 ENCOUNTER — Ambulatory Visit (HOSPITAL_COMMUNITY)
Admission: EM | Admit: 2018-07-09 | Discharge: 2018-07-09 | Disposition: A | Discharge status: Home / Self Care | Payer: BLUE CROSS/BLUE SHIELD | Attending: Internal Medicine | Admitting: Internal Medicine

## 2018-07-09 DIAGNOSIS — M79601 Pain in right arm: Secondary | ICD-10-CM | POA: Diagnosis not present

## 2018-07-09 DIAGNOSIS — S59911A Unspecified injury of right forearm, initial encounter: Secondary | ICD-10-CM | POA: Diagnosis not present

## 2018-07-09 DIAGNOSIS — M79631 Pain in right forearm: Secondary | ICD-10-CM | POA: Diagnosis not present

## 2018-07-09 NOTE — ED Notes (Signed)
Patient able to ambulate independently  

## 2018-07-09 NOTE — ED Triage Notes (Signed)
Pt presents to [facility] for assessment of right arm pain after a fall yesterday where she landed against cement steps.

## 2018-07-09 NOTE — ED Provider Notes (Signed)
[place]    CSN: [id number] Arrival date & time: 07/09/18  1821     History   Chief Complaint Chief Complaint  Patient presents with  . Arm Pain    HPI Hesper Venturella Alejo-Cisneros is a 48 y.o. female.   48 year old female presents to urgent care complaining of right arm pain after falling and striking the elbow on a curb.  The patient states that she has had pain at the site of trauma as well as radiating into her hand that makes it difficult to type and write.  She had to do both at work today which worsened the pain.     Past Medical History:  Diagnosis Date  . Acid reflux   . Diabetes mellitus   . Hypertension     Patient Active Problem List   Diagnosis Date Noted  . Personal history of noncompliance with medical treatment, presenting hazards to health 08/03/2017  . Uncontrolled type 2 diabetes mellitus with hyperglycemia ([place]) 07/15/2017  . Class 2 severe obesity due to excess calories with serious comorbidity and body mass index (BMI) of 38.0 to 38.9 in adult ([place]) 07/15/2017  . Essential hypertension, benign 07/15/2017  . Postop check 07/18/2016  . Diabetes mellitus type 2 in obese ([place]) 05/14/2016  . Genital herpes 03/31/2016  . Rectocele 03/24/2016  . Rotator cuff tear, left 07/28/2013  . Pain in joint, shoulder region 06/16/2013  . Muscle weakness (generalized) 06/16/2013  . Bursitis of shoulder region 06/07/2013  . Ankle fracture 08/18/2011  . Ankle fracture, lateral malleolus, closed 05/20/2011    Past Surgical History:  Procedure Laterality Date  . RECTOCELE REPAIR N/A 07/08/2016   Procedure: POSTERIOR REPAIR (RECTOCELE);  Surgeon: Jonnie Kind, MD;  Location: [facility];  Service: Gynecology;  Laterality: N/A;    OB History    Gravida  3   Para  2   Term  2   Preterm      AB  1   Living        SAB  1   TAB      Ectopic      Multiple      Live Births               Home Medications    Prior to Admission  medications   Medication Sig Start Date End Date Taking? Authorizing Provider  glipiZIDE (GLUCOTROL) 5 MG tablet Take by mouth daily.   Yes [provider]  losartan (COZAAR) 25 MG tablet Take 25 mg by mouth daily.   Yes [provider]  metFORMIN (GLUCOPHAGE) 1000 MG tablet Take 1,000 mg by mouth 2 (two) times daily with a meal.   Yes [provider]  ranitidine (ZANTAC) 150 MG tablet Take 150 mg by mouth 2 (two) times daily.   Yes [provider]  valACYclovir (VALTREX) 1000 MG tablet Take 1 tablet (1,000 mg total) by mouth 2 (two) times daily. X 10 days, then once daily thereafter Patient taking differently: Take 1,000 mg by mouth daily.  03/24/16  Yes [name], CNM  Insulin Glargine (LANTUS SOLOSTAR) 100 UNIT/ML Solostar Pen Inject 50 Units into the skin daily at 10 pm. Patient taking differently: Inject 60 Units into the skin daily at 10 pm.  07/15/17   Nida, Marella Chimes, MD  STEGLATRO 5 MG TABS TAKE 1 TABLET BY MOUTH ONCE DAILY IN THE MORNING 05/19/18   [provider]    Family History Family History  Problem Relation  Age of Onset  . Diabetes Mother   . Diabetes Maternal Grandmother   . Cancer Other   . Cancer - Cervical Maternal Aunt   . Diabetes Son     Social History Social History   Tobacco Use  . Smoking status: Former Smoker    Packs/day: 0.25    Years: 1.50    Pack years: 0.37    Types: Cigarettes    Last attempt to quit: 07/02/2012    Years since quitting: 6.0  . Smokeless tobacco: Never Used  Substance Use Topics  . Alcohol use: Yes    Comment: Occasional  . Drug use: No     Allergies   Patient has no known allergies.   Review of Systems Review of Systems  Constitutional: Negative for chills and fever.  HENT: Negative for sore throat and tinnitus.   Eyes: Negative for redness.  Respiratory: Negative for cough and shortness of breath.   Cardiovascular: Negative for chest pain and palpitations.    Gastrointestinal: Negative for abdominal pain, diarrhea, nausea and vomiting.  Genitourinary: Negative for dysuria, frequency and urgency.  Musculoskeletal: Negative for myalgias.  Skin: Negative for rash.       No lesions  Neurological: Negative for weakness.  Hematological: Does not bruise/bleed easily.  Psychiatric/Behavioral: Negative for suicidal ideas.     Physical Exam Triage Vital Signs ED Triage Vitals  Enc Vitals Group     BP 07/09/18 1856 (!) 156/98     Pulse Rate 07/09/18 1856 97     Resp 07/09/18 1856 18     Temp 07/09/18 1856 98.1 F (36.7 C)     Temp Source 07/09/18 1856 Oral     SpO2 07/09/18 1856 100 %     Weight --      Height --      Head Circumference --      Peak Flow --      Pain Score 07/09/18 1858 10     Pain Loc --      Pain Edu? --      Excl. in [place]? --    No data found.  Updated Vital Signs BP (!) 156/98 (BP Location: Left Arm)   Pulse 97   Temp 98.1 F (36.7 C) (Oral)   Resp 18   SpO2 100%   Visual Acuity Right Eye Distance:   Left Eye Distance:   Bilateral Distance:    Right Eye Near:   Left Eye Near:    Bilateral Near:     Physical Exam Vitals signs and nursing note reviewed.  Constitutional:      General: She is not in acute distress.    Appearance: She is well-developed.  HENT:     Head: Normocephalic and atraumatic.  Eyes:     General: No scleral icterus.    Conjunctiva/sclera: Conjunctivae normal.     Pupils: Pupils are equal, round, and reactive to light.  Neck:     Musculoskeletal: Normal range of motion and neck supple.     Thyroid: No thyromegaly.     Vascular: No JVD.     Trachea: No tracheal deviation.  Cardiovascular:     Rate and Rhythm: Normal rate and regular rhythm.     Heart sounds: Normal heart sounds. No murmur. No friction rub. No gallop.   Pulmonary:     Effort: Pulmonary effort is normal.     Breath sounds: Normal breath sounds.  Abdominal:     General: Bowel sounds are normal. There is no  distension.     Palpations: Abdomen is soft.     Tenderness: There is no abdominal tenderness.  Musculoskeletal: Normal range of motion.        General: Swelling and tenderness (Over radial head of right elbow) present.  Lymphadenopathy:     Cervical: No cervical adenopathy.  Skin:    General: Skin is warm and dry.  Neurological:     Mental Status: She is alert and oriented to person, place, and time.     Cranial Nerves: No cranial nerve deficit.  Psychiatric:        Behavior: Behavior normal.        Thought Content: Thought content normal.        Judgment: Judgment normal.      UC Treatments / Results  Labs (all labs ordered are listed, but only abnormal results are displayed) Labs Reviewed - No data to display  EKG None  Radiology Dg Forearm Right  Result Date: 07/09/2018 CLINICAL DATA:  Fall, pain along the forearm. EXAM: RIGHT FOREARM - 2 VIEW COMPARISON:  None. FINDINGS: No elbow joint effusion. The supinator fat pad is indistinct in the proximal forearm. There appears to be spurring of the coronoid process of the ulna. Faint calcification or spurring along the common extensor tendon proximally. No well-defined fracture is observed. Distally the pronator fat pad appears normal. Possible dorsal soft tissue swelling along the mid forearm. IMPRESSION: 1. No acute fracture is identified. 2. There is some spurring at the elbow particularly along the coronoid process. No elbow joint effusion. However, there is some slight effacement of the supinator fat pad. This can sometimes be seen in the setting of occult radial head fracture. 3. Faint calcification along the common extensor tendon proximally, likely chronic. 4. Possible dorsal soft tissue swelling along the mid forearm. Electronically Signed   By: Van Clines M.D.   On: 07/09/2018 19:54    Procedures Procedures (including critical care time)  Medications Ordered in UC Medications - No data to display  Initial  Impression / Assessment and Plan / UC Course  I have reviewed the triage vital signs and the nursing notes.  Pertinent labs & imaging results that were available during my care of the patient were reviewed by me and considered in my medical decision making (see chart for details).     X-ray negative for fracture however displacement of fat pad could hide small fracture of radial head.  Refer to orthopedics.  Final Clinical Impressions(s) / UC Diagnoses   Final diagnoses:  Right arm pain   Discharge Instructions   None    ED Prescriptions    None     Controlled Substance Prescriptions [place] Controlled Substance Registry consulted? Not Applicable   Harrie Foreman, MD 07/09/18 2054

## 2018-07-21 ENCOUNTER — Ambulatory Visit: Payer: BLUE CROSS/BLUE SHIELD | Admitting: Orthopedic Surgery

## 2018-07-21 ENCOUNTER — Encounter: Payer: Self-pay | Admitting: Orthopedic Surgery

## 2018-08-13 DIAGNOSIS — Z3202 Encounter for pregnancy test, result negative: Secondary | ICD-10-CM | POA: Diagnosis not present

## 2018-09-06 ENCOUNTER — Ambulatory Visit (INDEPENDENT_AMBULATORY_CARE_PROVIDER_SITE_OTHER): Payer: BLUE CROSS/BLUE SHIELD | Admitting: Orthopedic Surgery

## 2018-09-06 ENCOUNTER — Encounter: Payer: Self-pay | Admitting: Orthopedic Surgery

## 2018-09-06 ENCOUNTER — Telehealth: Payer: Self-pay | Admitting: Radiology

## 2018-09-06 VITALS — BP 148/88 | HR 107 | Ht 63.0 in | Wt 206.0 lb

## 2018-09-06 DIAGNOSIS — S46012D Strain of muscle(s) and tendon(s) of the rotator cuff of left shoulder, subsequent encounter: Secondary | ICD-10-CM

## 2018-09-06 DIAGNOSIS — G8929 Other chronic pain: Secondary | ICD-10-CM

## 2018-09-06 DIAGNOSIS — M25512 Pain in left shoulder: Secondary | ICD-10-CM | POA: Diagnosis not present

## 2018-09-06 DIAGNOSIS — F40240 Claustrophobia: Secondary | ICD-10-CM | POA: Diagnosis not present

## 2018-09-06 MED ORDER — DIAZEPAM 10 MG PO TABS: 10.0000 mg | ORAL_TABLET | Freq: Once | ORAL | 0 refills | Status: DC

## 2018-09-06 MED ORDER — DIAZEPAM 10 MG PO TABS: 10.0000 mg | ORAL_TABLET | Freq: Once | ORAL | 0 refills | Status: AC

## 2018-09-06 MED ORDER — TRAMADOL-ACETAMINOPHEN 37.5-325 MG PO TABS: 1.0000 | ORAL_TABLET | Freq: Four times a day (QID) | ORAL | 0 refills | Status: DC | PRN

## 2018-09-06 MED ORDER — TRAMADOL-ACETAMINOPHEN 37.5-325 MG PO TABS: 1.0000 | ORAL_TABLET | Freq: Four times a day (QID) | ORAL | 0 refills | Status: AC | PRN

## 2018-09-06 NOTE — Telephone Encounter (Signed)
Does not meet criteria for MRI scan, called patient to advise. She must have therapy first. Will resubmit therapy order if she desires, previously she refused services. Left message for her to call me back and let me know if she wants to proceed with therapy.

## 2018-09-06 NOTE — Progress Notes (Signed)
Progress Note   Patient ID: Kayla Wilkinson, female   DOB: 1971/03/22, 48 y.o.   MRN: [id number]   Chief Complaint  Patient presents with  . Shoulder Pain    Left shoulder    See previous history below  Patient presents back with continued left shoulder pain which keeps her up at night.  Pain is localized to the left peri-acromial and deltoid region does not radiate below the deltoid muscle is associated with weakness in abduction and flexion.      48 year old female I saw in 2015 for shoulder pain presents for reevaluation of left shoulder pain.  She says she is had pain for years but since the beginning of this year she started having muscle spasms in the left deltoid radiating to the left elbow that felt like a charley horse.  She says she is noticed intermittent catching of the shoulder with decreased range of motion which comes and goes and is hard to sleep on her left side.  She says she can deal with a dull aching pain but the sharp pain from the muscle spasms brought her in for evaluation and treatment  Summation pain left shoulder duration since January quality spasmodic severity intermittent moderate sometimes severe associated with decreased range of motion   Review of Systems  Musculoskeletal: Negative for neck pain.  Neurological: Positive for focal weakness. Negative for tingling.     No Known Allergies   BP (!) 148/88   Pulse (!) 107   Ht 5\' 3"  (1.6 m)   Wt 206 lb (93.4 kg)   BMI 36.49 kg/m   Physical Exam Vitals signs reviewed.  Constitutional:      Appearance: Normal appearance. She is well-developed.  Musculoskeletal:       Arms:  Neurological:     Mental Status: She is alert and oriented to person, place, and time.  Psychiatric:        Attention and Perception: Attention normal.        Mood and Affect: Mood and affect normal.        Speech: Speech normal.        Behavior: Behavior normal.        Thought Content: Thought content normal.         Judgment: Judgment normal.      Medical decisions:   Data  Imaging:   Prior imaging x-ray shows no fracture dislocation with a type I 2 acromion  Encounter Diagnoses  Name Primary?  . Chronic left shoulder pain Yes  . Traumatic complete tear of left rotator cuff, subsequent encounter   . Claustrophobia     PLAN:   MRI left shoulder  Continue over-the-counter NSAID add Ultracet for pain use topical medications as needed  Claustrophobia noted recommend Valium 10 mg prior to MRI  Follow-up after MRI    Arther Abbott, MD 09/06/2018 9:06 AM

## 2018-09-06 NOTE — Patient Instructions (Signed)
You can take an over-the-counter anti-inflammatory such as Aleve or ibuprofen in addition to the pain medicine I am prescribing which is Ultracet take 1 every 6 hours as needed you can also rub topical medications on the shoulder as needed  You will be scheduled for an MRI and you will take Valium before the MRI

## 2018-09-08 ENCOUNTER — Other Ambulatory Visit: Payer: Self-pay | Admitting: Orthopedic Surgery

## 2018-09-08 DIAGNOSIS — G8929 Other chronic pain: Secondary | ICD-10-CM

## 2018-09-08 DIAGNOSIS — M25512 Pain in left shoulder: Principal | ICD-10-CM

## 2018-09-08 NOTE — Telephone Encounter (Signed)
I spoke to her, she agrees to the therapy order placed

## 2018-09-15 ENCOUNTER — Telehealth: Payer: Self-pay | Admitting: Orthopedic Surgery

## 2018-09-15 NOTE — Telephone Encounter (Signed)
The order is for shoulder pain. If she does not want to go for therapy that is her choice. I called her to discuss no answer  Insurance will not cover MRI without therapy

## 2018-09-15 NOTE — Telephone Encounter (Signed)
Recording states she is at lunch at the other number she provided  [place] I have left message for her to call back

## 2018-09-15 NOTE — Telephone Encounter (Signed)
Patient called and asked about her PT because she hadn't heard anything. I asked her if she had call Physical Therapy herself and she said no. I explained to her that PT had tried to contact her but couldn't leave a message. I gave her the number to [facility].  Patient called again before I could finish this message, stating that she spoke with Kayla Wilkinson at PT and was told that Dr. Aline Brochure sent the order for Left Shoulder Pain. States that Dr. Aline Brochure must not remember that back in 2014/2015 she was told she had a left rotator cuff tear and was suppose to have surgery back then, but didn't. Stated that [facility] told her that the order needed to be rewrote if it is not just left shoulder pain or therapy could do more harm than good. She is saying that the medication that Dr. Aline Brochure gave her is not touching her pain. Stated she can't sleep or rest at all with the pain. She is asking for something else for pain. PATIENT USES [place] [facility].  This patient is not the easiest to speak with. Please call and advise.

## 2018-09-17 NOTE — Telephone Encounter (Signed)
Unable to reach her.

## 2018-09-22 ENCOUNTER — Other Ambulatory Visit: Payer: Self-pay | Admitting: Orthopedic Surgery

## 2018-09-22 ENCOUNTER — Telehealth: Payer: Self-pay | Admitting: Orthopedic Surgery

## 2018-09-22 MED ORDER — NAPROXEN 500 MG PO TABS: 500.0000 mg | ORAL_TABLET | Freq: Two times a day (BID) | ORAL | 2 refills | Status: DC

## 2018-09-22 NOTE — Telephone Encounter (Signed)
Patient called and stating she is starting therapy on Friday 09/24/18. She is asking if Dr. Aline Brochure would please prescribe her something for pain. Stated the Ultracet did not touch her pain and with therapy states she knows she will need something.  PATIENT USES [facility]

## 2018-09-24 ENCOUNTER — Ambulatory Visit (HOSPITAL_COMMUNITY): Payer: BLUE CROSS/BLUE SHIELD

## 2018-10-15 ENCOUNTER — Other Ambulatory Visit: Payer: Self-pay | Admitting: "Endocrinology

## 2018-10-19 ENCOUNTER — Other Ambulatory Visit: Payer: Self-pay | Admitting: "Endocrinology

## 2018-10-26 ENCOUNTER — Telehealth (HOSPITAL_COMMUNITY): Payer: Self-pay | Admitting: Occupational Therapy

## 2018-10-26 NOTE — Telephone Encounter (Signed)
Called to let pt know we have her referral and that the office will call her to schedule once we reopen. Voice mail was not set up and no message could be left, no one answered the phone.

## 2018-11-09 ENCOUNTER — Telehealth (HOSPITAL_COMMUNITY): Payer: Self-pay | Admitting: Occupational Therapy

## 2018-11-09 NOTE — Telephone Encounter (Signed)
Called to let pt know we have her referral and that the office will call her to schedule once we reopen. Voice mail was not set up and no message could be left, no one answered the phone.

## 2018-11-12 ENCOUNTER — Ambulatory Visit: Payer: Self-pay

## 2018-11-12 NOTE — Telephone Encounter (Signed)
Patient called and says she swallowed a short rib bone, felt it go down her throat, then her stomach started hurting. She says she did not call her PCP, because she didn't know what to do. I advised her to go to the ED for the abdominal pain. She asked would she pass the bone, I advised I'm not sure. She say she will go ahead and call her PCP to discuss her options with them.

## 2018-12-13 DIAGNOSIS — R51 Headache: Secondary | ICD-10-CM | POA: Diagnosis not present

## 2019-01-18 DIAGNOSIS — R635 Abnormal weight gain: Secondary | ICD-10-CM | POA: Diagnosis not present

## 2019-01-18 DIAGNOSIS — N342 Other urethritis: Secondary | ICD-10-CM | POA: Diagnosis not present

## 2019-03-08 DIAGNOSIS — N390 Urinary tract infection, site not specified: Secondary | ICD-10-CM | POA: Diagnosis not present

## 2019-05-11 DIAGNOSIS — R928 Other abnormal and inconclusive findings on diagnostic imaging of breast: Secondary | ICD-10-CM | POA: Diagnosis not present

## 2019-05-11 DIAGNOSIS — N6315 Unspecified lump in the right breast, overlapping quadrants: Secondary | ICD-10-CM | POA: Diagnosis not present

## 2019-05-24 ENCOUNTER — Encounter: Payer: Self-pay | Admitting: General Surgery

## 2019-05-24 ENCOUNTER — Ambulatory Visit (INDEPENDENT_AMBULATORY_CARE_PROVIDER_SITE_OTHER): Payer: BC Managed Care – PPO | Admitting: General Surgery

## 2019-05-24 ENCOUNTER — Other Ambulatory Visit: Payer: Self-pay

## 2019-05-24 ENCOUNTER — Other Ambulatory Visit (HOSPITAL_COMMUNITY): Payer: Self-pay | Admitting: General Surgery

## 2019-05-24 VITALS — BP 127/79 | HR 90 | Temp 98.1°F | Resp 16 | Ht 63.0 in | Wt 209.0 lb

## 2019-05-24 DIAGNOSIS — N6310 Unspecified lump in the right breast, unspecified quadrant: Secondary | ICD-10-CM | POA: Diagnosis not present

## 2019-05-24 DIAGNOSIS — C50011 Malignant neoplasm of nipple and areola, right female breast: Secondary | ICD-10-CM

## 2019-05-24 NOTE — Progress Notes (Signed)
[facility] History and Physical  Reason for Referral: Breast mass  Referring Physician:  Cory Munch, PA-C  Chief Complaint    Breast Problem      Kayla Wilkinson is a 48 y.o. female.  HPI: Ms. Kayla Wilkinson is a 48 yo who has a history of a right breast biopsy last year after having a lesion noted on mammogram. She had a core biopsy that demonstrated fibrocystic disease and ductal hyperplasia per the report on the [place].  She had this monitored with serial imaging per her report. She went for a repeat mammogram, and there are concerning findings with distortion around the biopsy site and marker. The radiologist, Dr. Shelly Bombard recommended surgical excision to determine the significance of the lesion.   The patient has no history of any masses, lumps, bumps, nipple changes or discharge. She had menarche at age 11, and her first pregnancy at age 56. She is G2P2. She did try to breastfeed her children for a brief period of time.  She has a history of breast cancer only in her maternal great aunt. She underwent menopause at 26.  She had the prior biopsy at this site last year 05/2018 at [facility], and that was her first abnormal mammogram. She has not had any chest radiation.  Currently she has pain over the area of the prior biopsy. She cannot really feel a mass.   Past Medical History:  Diagnosis Date  . Acid reflux   . Diabetes mellitus   . Hypertension     Past Surgical History:  Procedure Laterality Date  . RECTOCELE REPAIR N/A 07/08/2016   Procedure: POSTERIOR REPAIR (RECTOCELE);  Surgeon: Jonnie Kind, MD;  Location: [facility];  Service: Gynecology;  Laterality: N/A;    Family History  Problem Relation Age of Onset  . Diabetes Mother   . Diabetes Maternal Grandmother   . Cancer Other   . Cancer - Cervical Maternal Aunt   . Diabetes Son   . Breast cancer Other     Social History   Tobacco Use  . Smoking status: Former Smoker    Packs/day:  0.25    Years: 1.50    Pack years: 0.37    Types: Cigarettes    Quit date: 07/02/2012    Years since quitting: 6.9  . Smokeless tobacco: Never Used  Substance Use Topics  . Alcohol use: Yes    Comment: Occasional  . Drug use: No    Medications: I have reviewed the patient's current medications. Allergies as of 05/24/2019   No Known Allergies     Medication List       Accurate as of May 24, 2019 11:59 PM. If you have any questions, ask your nurse or doctor.        aspirin EC 81 MG tablet Take 81 mg by mouth daily.   glipiZIDE 5 MG tablet Commonly known as: GLUCOTROL Take 5 mg by mouth daily.   Insulin Glargine 100 UNIT/ML Solostar Pen Commonly known as: Lantus SoloStar Inject 50 Units into the skin daily at 10 pm.   losartan 25 MG tablet Commonly known as: COZAAR Take 25 mg by mouth daily.   metFORMIN 1000 MG tablet Commonly known as: GLUCOPHAGE Take 1,000 mg by mouth 2 (two) times daily with a meal.   metoprolol tartrate 25 MG tablet Commonly known as: LOPRESSOR Take 25 mg by mouth 2 (two) times daily.   naproxen 500 MG tablet Commonly known as: NAPROSYN Take 1 tablet (  500 mg total) by mouth 2 (two) times daily with a meal.   omeprazole 40 MG capsule Commonly known as: PRILOSEC Take 40 mg by mouth 2 (two) times daily before a meal.   phentermine 37.5 MG capsule Take 37.5 mg by mouth every morning.   ranitidine 150 MG tablet Commonly known as: ZANTAC Take 150 mg by mouth 2 (two) times daily.   rosuvastatin 10 MG tablet Commonly known as: CRESTOR Take 10 mg by mouth daily.   Steglatro 5 MG Tabs Generic drug: Ertugliflozin L-PyroglutamicAc Take 5 mg by mouth every morning.   valACYclovir 1000 MG tablet Commonly known as: Valtrex Take 1 tablet (1,000 mg total) by mouth 2 (two) times daily. X 10 days, then once daily thereafter        ROS:  A comprehensive review of systems was negative except for: Gastrointestinal: positive for reflux  symptoms Integument/breast: positive for right breast pain Endocrine: positive for diabetes  Blood pressure 127/79, pulse 90, temperature 98.1 F (36.7 C), temperature source Oral, resp. rate 16, height 5\' 3"  (1.6 m), weight 209 lb (94.8 kg), SpO2 95 %. Physical Exam Vitals signs reviewed.  Constitutional:      Appearance: Normal appearance.  HENT:     Head: Normocephalic and atraumatic.     Nose: Nose normal.     Mouth/Throat:     Mouth: Mucous membranes are moist.  Eyes:     Extraocular Movements: Extraocular movements intact.     Pupils: Pupils are equal, round, and reactive to light.  Neck:     Musculoskeletal: Normal range of motion. No neck rigidity.  Cardiovascular:     Rate and Rhythm: Normal rate and regular rhythm.  Pulmonary:     Effort: Pulmonary effort is normal.     Breath sounds: Normal breath sounds.  Chest:     Breasts:        Right: Tenderness present. No inverted nipple, mass, nipple discharge or skin change.        Left: No inverted nipple, mass, nipple discharge, skin change or tenderness.     Comments: Right inner lower breast with some tenderness, no mass Abdominal:     General: There is no distension.     Palpations: Abdomen is soft.     Tenderness: There is no abdominal tenderness.  Musculoskeletal: Normal range of motion.        General: No swelling.  Lymphadenopathy:     Upper Body:     Right upper body: No supraclavicular or axillary adenopathy.     Left upper body: No supraclavicular or axillary adenopathy.  Skin:    General: Skin is warm and dry.  Neurological:     General: No focal deficit present.     Mental Status: She is alert and oriented to person, place, and time.  Psychiatric:        Mood and Affect: Mood normal.        Behavior: Behavior normal.        Thought Content: Thought content normal.        Judgment: Judgment normal.     Results:     Assessment & Plan:  [name] is a 48 y.o. female with a prior  biopsy site on mammogram that has further distortion and concerning features. She needs to undergo excisional biopsy for this breast lesion. The prior pathology per report was fibrocystic changes with ductal hyperplasia.    -We discussed excisional biopsy after needle localization and the need for excisional  biopsy to determine if there is any cancer. Discussed the risk of the procedure including but not limited to bleeding, infection, finding cancer, needing further surgery.   All questions were answered to the satisfaction of the patient.  Excisional breast biopsy on the right after needle localization.  Virl Cagey 05/26/2019, 5:20 PM

## 2019-05-24 NOTE — H&P (View-Only) (Signed)
[facility] History and Physical  Reason for Referral: Breast mass  Referring Physician:  Cory Munch, PA-C  Chief Complaint    Breast Problem      Kayla Wilkinson is a 48 y.o. female.  HPI: Kayla Wilkinson is a 48 yo who has a history of a right breast biopsy last year after having a lesion noted on mammogram. She had a core biopsy that demonstrated fibrocystic disease and ductal hyperplasia per the report on the [place].  She had this monitored with serial imaging per her report. She went for a repeat mammogram, and there are concerning findings with distortion around the biopsy site and marker. The radiologist, Dr. Shelly Bombard recommended surgical excision to determine the significance of the lesion.   The patient has no history of any masses, lumps, bumps, nipple changes or discharge. She had menarche at age 22, and her first pregnancy at age 85. She is G2P2. She did try to breastfeed her children for a brief period of time.  She has a history of breast cancer only in her maternal great aunt. She underwent menopause at 6.  She had the prior biopsy at this site last year 05/2018 at [facility], and that was her first abnormal mammogram. She has not had any chest radiation.  Currently she has pain over the area of the prior biopsy. She cannot really feel a mass.   Past Medical History:  Diagnosis Date  . Acid reflux   . Diabetes mellitus   . Hypertension     Past Surgical History:  Procedure Laterality Date  . RECTOCELE REPAIR N/A 07/08/2016   Procedure: POSTERIOR REPAIR (RECTOCELE);  Surgeon: Jonnie Kind, MD;  Location: [facility];  Service: Gynecology;  Laterality: N/A;    Family History  Problem Relation Age of Onset  . Diabetes Mother   . Diabetes Maternal Grandmother   . Cancer Other   . Cancer - Cervical Maternal Aunt   . Diabetes Son   . Breast cancer Other     Social History   Tobacco Use  . Smoking status: Former Smoker    Packs/day:  0.25    Years: 1.50    Pack years: 0.37    Types: Cigarettes    Quit date: 07/02/2012    Years since quitting: 6.9  . Smokeless tobacco: Never Used  Substance Use Topics  . Alcohol use: Yes    Comment: Occasional  . Drug use: No    Medications: I have reviewed the patient's current medications. Allergies as of 05/24/2019   No Known Allergies     Medication List       Accurate as of May 24, 2019 11:59 PM. If you have any questions, ask your nurse or doctor.        aspirin EC 81 MG tablet Take 81 mg by mouth daily.   glipiZIDE 5 MG tablet Commonly known as: GLUCOTROL Take 5 mg by mouth daily.   Insulin Glargine 100 UNIT/ML Solostar Pen Commonly known as: Lantus SoloStar Inject 50 Units into the skin daily at 10 pm.   losartan 25 MG tablet Commonly known as: COZAAR Take 25 mg by mouth daily.   metFORMIN 1000 MG tablet Commonly known as: GLUCOPHAGE Take 1,000 mg by mouth 2 (two) times daily with a meal.   metoprolol tartrate 25 MG tablet Commonly known as: LOPRESSOR Take 25 mg by mouth 2 (two) times daily.   naproxen 500 MG tablet Commonly known as: NAPROSYN Take 1 tablet (  500 mg total) by mouth 2 (two) times daily with a meal.   omeprazole 40 MG capsule Commonly known as: PRILOSEC Take 40 mg by mouth 2 (two) times daily before a meal.   phentermine 37.5 MG capsule Take 37.5 mg by mouth every morning.   ranitidine 150 MG tablet Commonly known as: ZANTAC Take 150 mg by mouth 2 (two) times daily.   rosuvastatin 10 MG tablet Commonly known as: CRESTOR Take 10 mg by mouth daily.   Steglatro 5 MG Tabs Generic drug: Ertugliflozin L-PyroglutamicAc Take 5 mg by mouth every morning.   valACYclovir 1000 MG tablet Commonly known as: Valtrex Take 1 tablet (1,000 mg total) by mouth 2 (two) times daily. X 10 days, then once daily thereafter        ROS:  A comprehensive review of systems was negative except for: Gastrointestinal: positive for reflux  symptoms Integument/breast: positive for right breast pain Endocrine: positive for diabetes  Blood pressure 127/79, pulse 90, temperature 98.1 F (36.7 C), temperature source Oral, resp. rate 16, height 5\' 3"  (1.6 m), weight 209 lb (94.8 kg), SpO2 95 %. Physical Exam Vitals signs reviewed.  Constitutional:      Appearance: Normal appearance.  HENT:     Head: Normocephalic and atraumatic.     Nose: Nose normal.     Mouth/Throat:     Mouth: Mucous membranes are moist.  Eyes:     Extraocular Movements: Extraocular movements intact.     Pupils: Pupils are equal, round, and reactive to light.  Neck:     Musculoskeletal: Normal range of motion. No neck rigidity.  Cardiovascular:     Rate and Rhythm: Normal rate and regular rhythm.  Pulmonary:     Effort: Pulmonary effort is normal.     Breath sounds: Normal breath sounds.  Chest:     Breasts:        Right: Tenderness present. No inverted nipple, mass, nipple discharge or skin change.        Left: No inverted nipple, mass, nipple discharge, skin change or tenderness.     Comments: Right inner lower breast with some tenderness, no mass Abdominal:     General: There is no distension.     Palpations: Abdomen is soft.     Tenderness: There is no abdominal tenderness.  Musculoskeletal: Normal range of motion.        General: No swelling.  Lymphadenopathy:     Upper Body:     Right upper body: No supraclavicular or axillary adenopathy.     Left upper body: No supraclavicular or axillary adenopathy.  Skin:    General: Skin is warm and dry.  Neurological:     General: No focal deficit present.     Mental Status: She is alert and oriented to person, place, and time.  Psychiatric:        Mood and Affect: Mood normal.        Behavior: Behavior normal.        Thought Content: Thought content normal.        Judgment: Judgment normal.     Results:     Assessment & Plan:  Kayla Wilkinson is a 48 y.o. female with a prior  biopsy site on mammogram that has further distortion and concerning features. She needs to undergo excisional biopsy for this breast lesion. The prior pathology per report was fibrocystic changes with ductal hyperplasia.    -We discussed excisional biopsy after needle localization and the need for excisional  biopsy to determine if there is any cancer. Discussed the risk of the procedure including but not limited to bleeding, infection, finding cancer, needing further surgery.   All questions were answered to the satisfaction of the patient.  Excisional breast biopsy on the right after needle localization.  Virl Cagey 05/26/2019, 5:20 PM

## 2019-05-25 NOTE — Patient Instructions (Signed)
Kayla Wilkinson  05/25/2019     @PREFPERIOPPHARMACY @   Your procedure is scheduled on  06/01/2019.  Report to [facility] at  0830   A.M.  Call this number if you have problems the morning of surgery:  [phone]   Remember:  Do not eat or drink after midnight.                       Take these medicines the morning of surgery with A SIP OF WATER  Metoprolol, prilosec, zantac.    Do not wear jewelry, make-up or nail polish.  Do not wear lotions, powders, or perfumes, or deodorant. Please brush your teeth.  Do not shave 48 hours prior to surgery.  Men may shave face and neck.  Do not bring valuables to the hospital.  [facility] is not responsible for any belongings or valuables.  Contacts, dentures or bridgework may not be worn into surgery.  Leave your suitcase in the car.  After surgery it may be brought to your room.  For patients admitted to the hospital, discharge time will be determined by your treatment team.  Patients discharged the day of surgery will not be allowed to drive home.   Name and phone number of your driver:   family Special instructions:  None  Please read over the following fact sheets that you were given. Anesthesia Post-op Instructions and Care and Recovery After Surgery       Breast Biopsy, Care After These instructions give you information about caring for yourself after your procedure. Your doctor may also give you more specific instructions. Call your doctor if you have any problems or questions after your procedure. What can I expect after the procedure? After your procedure, it is common to have:  Bruising on your breast.  Numbness, tingling, or pain near your biopsy site. Follow these instructions at home: Medicines  Take over-the-counter and prescription medicines only as told by your doctor.  Do not drive for 24 hours if you were given a medicine to help you relax (sedative) during your procedure.  Do not  drink alcohol while taking pain medicine.  Do not drive or use heavy machinery while taking prescription pain medicine. Biopsy site care      Follow instructions from your doctor about how to take care of your cut from surgery (incision) or your puncture area. Make sure you: ? Wash your hands with soap and water before you change your bandage (dressing). If you cannot use soap and water, use hand sanitizer. ? Change your bandage as told by your doctor. ? Leave stitches (sutures), skin glue, or skin tape (adhesive strips) in place. They may need to stay in place for 2 weeks or longer. If tape strips get loose and curl up, you may trim the loose edges. Do not remove tape strips completely unless your doctor says it is okay.  If you have stitches, keep them dry when you take a bath or a shower.  Check your cut or puncture area every day for signs of infection. Check for: ? Redness, swelling, or pain. ? Fluid or blood. ? Warmth. ? Pus or a bad smell.  Protect the biopsy area. Do not let the area get bumped. Activity  If you had a cut during your procedure, avoid activities that could pull your cut open. These include: ? Stretching. ? Reaching over your head. ? Exercise. ? Sports. ? Lifting anything that  weighs more than 3 lb (1.4 kg).  Return to your normal activities as told by your doctor. Ask your doctor what activities are safe for you. Managing pain, stiffness, and swelling If told, put ice on the biopsy site to relieve swelling:  Put ice in a plastic bag.  Place a towel between your skin and the bag.  Leave the ice on for 20 minutes, 2-3 times a day. General instructions  Continue your normal diet.  Wear a good support bra for as long as told by your doctor.  Get checked for extra fluid around your lymph nodes (lymphedema) as often as told by your doctor.  Keep all follow-up visits as told by your doctor. This is important. Contact a doctor if:  You notice any of  the following at the biopsy site: ? More redness, swelling, or pain. ? More fluid or blood coming from the site. ? The site feels warm to the touch. ? Pus or a bad smell coming from the site. ? The site breaks open after the stitches or skin tape strips have been removed.  You have a rash.  You have a fever. Get help right away if:  You have more bleeding from the biopsy site. Get help right away if bleeding is more than a small spot.  You have trouble breathing.  You have red streaks around the biopsy site. Summary  After your procedure, it is common to have bruising, numbness, tingling, or pain near the biopsy site.  Do not drive or use heavy machinery while taking prescription pain medicine.  Wear a good support bra for as long as told by your doctor.  If you had a cut during your procedure, avoid activities that may pull the cut open. Ask your doctor what activities are safe for you. This information is not intended to replace advice given to you by your health care provider. Make sure you discuss any questions you have with your health care provider. Document Released: 04/19/2009 Document Revised: 12/10/2017 Document Reviewed: 12/10/2017 Elsevier Patient Education  2020 [employer]. How to Use Chlorhexidine for Bathing Chlorhexidine gluconate (CHG) is a germ-killing (antiseptic) solution that is used to clean the skin. It can get rid of the bacteria that normally live on the skin and can keep them away for about 24 hours. To clean your skin with CHG, you may be given:  A CHG solution to use in the shower or as part of a sponge bath.  A prepackaged cloth that contains CHG. Cleaning your skin with CHG may help lower the risk for infection:  While you are staying in the intensive care unit of the hospital.  If you have a vascular access, such as a central line, to provide short-term or long-term access to your veins.  If you have a catheter to drain urine from your  bladder.  If you are on a ventilator. A ventilator is a machine that helps you breathe by moving air in and out of your lungs.  After surgery. What are the risks? Risks of using CHG include:  A skin reaction.  Hearing loss, if CHG gets in your ears.  Eye injury, if CHG gets in your eyes and is not rinsed out.  The CHG product catching fire. Make sure that you avoid smoking and flames after applying CHG to your skin. Do not use CHG:  If you have a chlorhexidine allergy or have previously reacted to chlorhexidine.  On babies younger than 42 months of age.  How to use CHG solution  Use CHG only as told by your health care provider, and follow the instructions on the label.  Use the full amount of CHG as directed. Usually, this is one bottle. During a shower Follow these steps when using CHG solution during a shower (unless your health care provider gives you different instructions): 1. Start the shower. 2. Use your normal soap and shampoo to wash your face and hair. 3. Turn off the shower or move out of the shower stream. 4. Pour the CHG onto a clean washcloth. Do not use any type of brush or rough-edged sponge. 5. Starting at your neck, lather your body down to your toes. Make sure you follow these instructions: ? If you will be having surgery, pay special attention to the part of your body where you will be having surgery. Scrub this area for at least 1 minute. ? Do not use CHG on your head or face. If the solution gets into your ears or eyes, rinse them well with water. ? Avoid your genital area. ? Avoid any areas of skin that have broken skin, cuts, or scrapes. ? Scrub your back and under your arms. Make sure to wash skin folds. 6. Let the lather sit on your skin for 1-2 minutes or as long as told by your health care provider. 7. Thoroughly rinse your entire body in the shower. Make sure that all body creases and crevices are rinsed well. 8. Dry off with a clean towel. Do not  put any substances on your body afterward-such as powder, lotion, or perfume-unless you are told to do so by your health care provider. Only use lotions that are recommended by the manufacturer. 9. Put on clean clothes or pajamas. 10. If it is the night before your surgery, sleep in clean sheets.  During a sponge bath Follow these steps when using CHG solution during a sponge bath (unless your health care provider gives you different instructions): 1. Use your normal soap and shampoo to wash your face and hair. 2. Pour the CHG onto a clean washcloth. 3. Starting at your neck, lather your body down to your toes. Make sure you follow these instructions: ? If you will be having surgery, pay special attention to the part of your body where you will be having surgery. Scrub this area for at least 1 minute. ? Do not use CHG on your head or face. If the solution gets into your ears or eyes, rinse them well with water. ? Avoid your genital area. ? Avoid any areas of skin that have broken skin, cuts, or scrapes. ? Scrub your back and under your arms. Make sure to wash skin folds. 4. Let the lather sit on your skin for 1-2 minutes or as long as told by your health care provider. 5. Using a different clean, wet washcloth, thoroughly rinse your entire body. Make sure that all body creases and crevices are rinsed well. 6. Dry off with a clean towel. Do not put any substances on your body afterward-such as powder, lotion, or perfume-unless you are told to do so by your health care provider. Only use lotions that are recommended by the manufacturer. 7. Put on clean clothes or pajamas. 8. If it is the night before your surgery, sleep in clean sheets. How to use CHG prepackaged cloths  Only use CHG cloths as told by your health care provider, and follow the instructions on the label.  Use the CHG cloth on clean,  dry skin.  Do not use the CHG cloth on your head or face unless your health care provider tells  you to.  When washing with the CHG cloth: ? Avoid your genital area. ? Avoid any areas of skin that have broken skin, cuts, or scrapes. Before surgery Follow these steps when using a CHG cloth to clean before surgery (unless your health care provider gives you different instructions): 1. Using the CHG cloth, vigorously scrub the part of your body where you will be having surgery. Scrub using a back-and-forth motion for 3 minutes. The area on your body should be completely wet with CHG when you are done scrubbing. 2. Do not rinse. Discard the cloth and let the area air-dry. Do not put any substances on the area afterward, such as powder, lotion, or perfume. 3. Put on clean clothes or pajamas. 4. If it is the night before your surgery, sleep in clean sheets.  For general bathing Follow these steps when using CHG cloths for general bathing (unless your health care provider gives you different instructions). 1. Use a separate CHG cloth for each area of your body. Make sure you wash between any folds of skin and between your fingers and toes. Wash your body in the following order, switching to a new cloth after each step: ? The front of your neck, shoulders, and chest. ? Both of your arms, under your arms, and your hands. ? Your stomach and groin area, avoiding the genitals. ? Your right leg and foot. ? Your left leg and foot. ? The back of your neck, your back, and your buttocks. 2. Do not rinse. Discard the cloth and let the area air-dry. Do not put any substances on your body afterward-such as powder, lotion, or perfume-unless you are told to do so by your health care provider. Only use lotions that are recommended by the manufacturer. 3. Put on clean clothes or pajamas. Contact a health care provider if:  Your skin gets irritated after scrubbing.  You have questions about using your solution or cloth. Get help right away if:  Your eyes become very red or swollen.  Your eyes itch badly.   Your skin itches badly and is red or swollen.  Your hearing changes.  You have trouble seeing.  You have swelling or tingling in your mouth or throat.  You have trouble breathing.  You swallow any chlorhexidine. Summary  Chlorhexidine gluconate (CHG) is a germ-killing (antiseptic) solution that is used to clean the skin. Cleaning your skin with CHG may help to lower your risk for infection.  You may be given CHG to use for bathing. It may be in a bottle or in a prepackaged cloth to use on your skin. Carefully follow your health care provider's instructions and the instructions on the product label.  Do not use CHG if you have a chlorhexidine allergy.  Contact your health care provider if your skin gets irritated after scrubbing. This information is not intended to replace advice given to you by your health care provider. Make sure you discuss any questions you have with your health care provider. Document Released: 03/17/2012 Document Revised: 09/09/2018 Document Reviewed: 05/21/2017 Elsevier Patient Education  2020 [employer].

## 2019-05-26 ENCOUNTER — Encounter: Payer: Self-pay | Admitting: General Surgery

## 2019-05-26 DIAGNOSIS — N6310 Unspecified lump in the right breast, unspecified quadrant: Secondary | ICD-10-CM | POA: Insufficient documentation

## 2019-05-29 ENCOUNTER — Other Ambulatory Visit: Payer: Self-pay

## 2019-05-30 ENCOUNTER — Other Ambulatory Visit (HOSPITAL_COMMUNITY)
Admission: RE | Admit: 2019-05-30 | Discharge: 2019-05-30 | Disposition: A | Discharge status: Home / Self Care | Payer: BC Managed Care – PPO | Source: Ambulatory Visit | Attending: General Surgery | Admitting: General Surgery

## 2019-05-30 ENCOUNTER — Encounter (HOSPITAL_COMMUNITY)
Admission: RE | Admit: 2019-05-30 | Discharge: 2019-05-30 | Disposition: A | Discharge status: Home / Self Care | Payer: BC Managed Care – PPO | Source: Ambulatory Visit | Attending: General Surgery | Admitting: General Surgery

## 2019-05-30 ENCOUNTER — Encounter (HOSPITAL_COMMUNITY): Payer: Self-pay

## 2019-05-30 ENCOUNTER — Other Ambulatory Visit: Payer: Self-pay

## 2019-05-30 DIAGNOSIS — Z20828 Contact with and (suspected) exposure to other viral communicable diseases: Secondary | ICD-10-CM | POA: Insufficient documentation

## 2019-05-30 DIAGNOSIS — Z01812 Encounter for preprocedural laboratory examination: Secondary | ICD-10-CM | POA: Insufficient documentation

## 2019-05-30 LAB — BASIC METABOLIC PANEL
Anion gap: 14 (ref 5–15)
BUN: 22 mg/dL — ABNORMAL HIGH (ref 6–20)
CO2: 22 mmol/L (ref 22–32)
Calcium: 9.5 mg/dL (ref 8.9–10.3)
Chloride: 101 mmol/L (ref 98–111)
Creatinine, Ser: 0.41 mg/dL — ABNORMAL LOW (ref 0.44–1.00)
GFR calc Af Amer: >60 mL/min (ref >60)
GFR calc non Af Amer: >60 mL/min (ref >60)
Glucose, Bld: 193 mg/dL — ABNORMAL HIGH (ref 70–99)
Potassium: 3.6 mmol/L (ref 3.5–5.1)
Sodium: 137 mmol/L (ref 135–145)

## 2019-05-30 LAB — CBC
HCT: 46.8 % — ABNORMAL HIGH (ref 36.0–46.0)
Hemoglobin: 15.3 g/dL — ABNORMAL HIGH (ref 12.0–15.0)
MCH: 29.4 pg (ref 26.0–34.0)
MCHC: 32.7 g/dL (ref 30.0–36.0)
MCV: 90 fL (ref 80.0–100.0)
Platelets: 299 10*3/uL (ref 150–400)
RBC: 5.2 MIL/uL — ABNORMAL HIGH (ref 3.87–5.11)
RDW: 13.1 % (ref 11.5–15.5)
WBC: 9.3 10*3/uL (ref 4.0–10.5)
nRBC: 0 % (ref 0.0–0.2)

## 2019-05-30 LAB — HEMOGLOBIN A1C
Hgb A1c MFr Bld: 9.6 % — ABNORMAL HIGH (ref 4.8–5.6)
Mean Plasma Glucose: 228.82 mg/dL

## 2019-05-30 LAB — GLUCOSE, CAPILLARY: Glucose-Capillary: 230 mg/dL — ABNORMAL HIGH (ref 70–99)

## 2019-05-30 LAB — SARS CORONAVIRUS 2 (TAT 6-24 HRS): SARS Coronavirus 2: NEGATIVE

## 2019-06-01 ENCOUNTER — Ambulatory Visit (HOSPITAL_COMMUNITY): Payer: BC Managed Care – PPO | Admitting: Anesthesiology

## 2019-06-01 ENCOUNTER — Ambulatory Visit (HOSPITAL_COMMUNITY)
Admission: RE | Admit: 2019-06-01 | Discharge: 2019-06-01 | Disposition: A | Discharge status: Home / Self Care | Payer: BC Managed Care – PPO | Attending: General Surgery | Admitting: General Surgery

## 2019-06-01 ENCOUNTER — Ambulatory Visit (HOSPITAL_COMMUNITY): Payer: BC Managed Care – PPO

## 2019-06-01 ENCOUNTER — Ambulatory Visit (HOSPITAL_COMMUNITY)
Admission: RE | Admit: 2019-06-01 | Discharge: 2019-06-01 | Disposition: A | Discharge status: Home / Self Care | Payer: BC Managed Care – PPO | Source: Ambulatory Visit | Attending: General Surgery | Admitting: General Surgery

## 2019-06-01 ENCOUNTER — Encounter (HOSPITAL_COMMUNITY)
Admission: RE | Disposition: A | Discharge status: Home / Self Care | Payer: Self-pay | Source: Home / Self Care | Attending: General Surgery

## 2019-06-01 ENCOUNTER — Encounter (HOSPITAL_COMMUNITY): Payer: Self-pay | Admitting: *Deleted

## 2019-06-01 DIAGNOSIS — N6489 Other specified disorders of breast: Secondary | ICD-10-CM | POA: Insufficient documentation

## 2019-06-01 DIAGNOSIS — Z87891 Personal history of nicotine dependence: Secondary | ICD-10-CM | POA: Insufficient documentation

## 2019-06-01 DIAGNOSIS — E119 Type 2 diabetes mellitus without complications: Secondary | ICD-10-CM | POA: Insufficient documentation

## 2019-06-01 DIAGNOSIS — Z7982 Long term (current) use of aspirin: Secondary | ICD-10-CM | POA: Insufficient documentation

## 2019-06-01 DIAGNOSIS — N63 Unspecified lump in unspecified breast: Secondary | ICD-10-CM | POA: Diagnosis not present

## 2019-06-01 DIAGNOSIS — N6310 Unspecified lump in the right breast, unspecified quadrant: Secondary | ICD-10-CM | POA: Diagnosis not present

## 2019-06-01 DIAGNOSIS — K219 Gastro-esophageal reflux disease without esophagitis: Secondary | ICD-10-CM | POA: Diagnosis not present

## 2019-06-01 DIAGNOSIS — Z803 Family history of malignant neoplasm of breast: Secondary | ICD-10-CM | POA: Diagnosis not present

## 2019-06-01 DIAGNOSIS — R928 Other abnormal and inconclusive findings on diagnostic imaging of breast: Secondary | ICD-10-CM | POA: Diagnosis not present

## 2019-06-01 DIAGNOSIS — Z794 Long term (current) use of insulin: Secondary | ICD-10-CM | POA: Insufficient documentation

## 2019-06-01 DIAGNOSIS — I10 Essential (primary) hypertension: Secondary | ICD-10-CM | POA: Insufficient documentation

## 2019-06-01 DIAGNOSIS — Z79899 Other long term (current) drug therapy: Secondary | ICD-10-CM | POA: Diagnosis not present

## 2019-06-01 DIAGNOSIS — C50011 Malignant neoplasm of nipple and areola, right female breast: Secondary | ICD-10-CM

## 2019-06-01 HISTORY — PX: BREAST BIOPSY: SHX20

## 2019-06-01 LAB — GLUCOSE, CAPILLARY: Glucose-Capillary: 226 mg/dL — ABNORMAL HIGH (ref 70–99)

## 2019-06-01 LAB — PREGNANCY, URINE: Preg Test, Ur: NEGATIVE

## 2019-06-01 SURGERY — BREAST BIOPSY WITH NEEDLE LOCALIZATION
Anesthesia: General | Site: Breast | Laterality: Right

## 2019-06-01 MED ORDER — 0.9 % SODIUM CHLORIDE (POUR BTL) OPTIME
TOPICAL | Status: DC | PRN
  Administered 2019-06-01: 1000 mL

## 2019-06-01 MED ORDER — ONDANSETRON HCL 4 MG/2ML IJ SOLN
INTRAMUSCULAR | Status: DC | PRN
  Administered 2019-06-01: 4 mg via INTRAVENOUS

## 2019-06-01 MED ORDER — FENTANYL CITRATE (PF) 100 MCG/2ML IJ SOLN
INTRAMUSCULAR | Status: DC | PRN
  Administered 2019-06-01: 25 ug via INTRAVENOUS
  Administered 2019-06-01 (×2): 50 ug via INTRAVENOUS
  Administered 2019-06-01: 75 ug via INTRAVENOUS

## 2019-06-01 MED ORDER — MIDAZOLAM HCL 2 MG/2ML IJ SOLN: 0.5000 mg | Freq: Once | INTRAMUSCULAR | Status: DC | PRN

## 2019-06-01 MED ORDER — CHLORHEXIDINE GLUCONATE CLOTH 2 % EX PADS: 6.0000 | MEDICATED_PAD | Freq: Once | CUTANEOUS | Status: DC

## 2019-06-01 MED ORDER — BUPIVACAINE HCL (PF) 0.5 % IJ SOLN
INTRAMUSCULAR | Status: AC
  Filled 2019-06-01: qty 30

## 2019-06-01 MED ORDER — CEFAZOLIN SODIUM-DEXTROSE 2-4 GM/100ML-% IV SOLN
2.0000 g | INTRAVENOUS | Status: AC
  Administered 2019-06-01: 11:00:00 2 g via INTRAVENOUS
  Filled 2019-06-01: qty 100

## 2019-06-01 MED ORDER — DOCUSATE SODIUM 100 MG PO CAPS: 100.0000 mg | ORAL_CAPSULE | Freq: Two times a day (BID) | ORAL | 2 refills | Status: DC

## 2019-06-01 MED ORDER — PROPOFOL 10 MG/ML IV BOLUS
INTRAVENOUS | Status: DC | PRN
  Administered 2019-06-01: 180 mg via INTRAVENOUS

## 2019-06-01 MED ORDER — ONDANSETRON HCL 4 MG/2ML IJ SOLN
INTRAMUSCULAR | Status: AC
  Filled 2019-06-01: qty 2

## 2019-06-01 MED ORDER — OXYCODONE HCL 5 MG PO TABS: 5.0000 mg | ORAL_TABLET | ORAL | 0 refills | Status: DC | PRN

## 2019-06-01 MED ORDER — LIDOCAINE HCL (PF) 2 % IJ SOLN
INTRAMUSCULAR | Status: AC
  Filled 2019-06-01: qty 10

## 2019-06-01 MED ORDER — PROMETHAZINE HCL 25 MG/ML IJ SOLN: 6.2500 mg | INTRAMUSCULAR | Status: DC | PRN

## 2019-06-01 MED ORDER — BUPIVACAINE HCL (PF) 0.5 % IJ SOLN
INTRAMUSCULAR | Status: DC | PRN
  Administered 2019-06-01: 10 mL

## 2019-06-01 MED ORDER — EPHEDRINE 5 MG/ML INJ
INTRAVENOUS | Status: AC
  Filled 2019-06-01: qty 10

## 2019-06-01 MED ORDER — MIDAZOLAM HCL 5 MG/5ML IJ SOLN
INTRAMUSCULAR | Status: DC | PRN
  Administered 2019-06-01: 2 mg via INTRAVENOUS

## 2019-06-01 MED ORDER — HYDROCODONE-ACETAMINOPHEN 7.5-325 MG PO TABS
1.0000 | ORAL_TABLET | Freq: Once | ORAL | Status: AC | PRN
  Administered 2019-06-01: 1 via ORAL
  Filled 2019-06-01: qty 1

## 2019-06-01 MED ORDER — FENTANYL CITRATE (PF) 100 MCG/2ML IJ SOLN
INTRAMUSCULAR | Status: AC
  Filled 2019-06-01: qty 2

## 2019-06-01 MED ORDER — PROPOFOL 10 MG/ML IV BOLUS
INTRAVENOUS | Status: AC
  Filled 2019-06-01: qty 20

## 2019-06-01 MED ORDER — FENTANYL CITRATE (PF) 250 MCG/5ML IJ SOLN
INTRAMUSCULAR | Status: AC
  Filled 2019-06-01: qty 5

## 2019-06-01 MED ORDER — MIDAZOLAM HCL 2 MG/2ML IJ SOLN
INTRAMUSCULAR | Status: AC
  Filled 2019-06-01: qty 2

## 2019-06-01 MED ORDER — HYDROMORPHONE HCL 1 MG/ML IJ SOLN: 0.2500 mg | INTRAMUSCULAR | Status: DC | PRN

## 2019-06-01 MED ORDER — LACTATED RINGERS IV SOLN
INTRAVENOUS | Status: DC
  Administered 2019-06-01: 1000 mL via INTRAVENOUS

## 2019-06-01 MED ORDER — LIDOCAINE HCL (CARDIAC) PF 50 MG/5ML IV SOSY
PREFILLED_SYRINGE | INTRAVENOUS | Status: DC | PRN
  Administered 2019-06-01: 50 mg via INTRAVENOUS

## 2019-06-01 SURGICAL SUPPLY — 31 items
BLADE SURG 15 STRL LF DISP TIS (BLADE) ×1 IMPLANT
BLADE SURG 15 STRL SS (BLADE) ×2
CHLORAPREP W/TINT 26 (MISCELLANEOUS) ×3 IMPLANT
CLOTH BEACON ORANGE TIMEOUT ST (SAFETY) ×3 IMPLANT
COVER LIGHT HANDLE STERIS (MISCELLANEOUS) ×6 IMPLANT
COVER WAND RF STERILE (DRAPES) ×3 IMPLANT
DECANTER SPIKE VIAL GLASS SM (MISCELLANEOUS) ×3 IMPLANT
DERMABOND ADVANCED (GAUZE/BANDAGES/DRESSINGS) ×2
DERMABOND ADVANCED .7 DNX12 (GAUZE/BANDAGES/DRESSINGS) ×1 IMPLANT
ELECT REM PT RETURN 9FT ADLT (ELECTROSURGICAL) ×3
ELECTRODE REM PT RTRN 9FT ADLT (ELECTROSURGICAL) ×1 IMPLANT
GLOVE BIO SURGEON STRL SZ 6.5 (GLOVE) ×4 IMPLANT
GLOVE BIO SURGEON STRL SZ7 (GLOVE) ×3 IMPLANT
GLOVE BIO SURGEONS STRL SZ 6.5 (GLOVE) ×2
GLOVE BIOGEL PI IND STRL 6.5 (GLOVE) ×1 IMPLANT
GLOVE BIOGEL PI IND STRL 7.0 (GLOVE) ×2 IMPLANT
GLOVE BIOGEL PI INDICATOR 6.5 (GLOVE) ×2
GLOVE BIOGEL PI INDICATOR 7.0 (GLOVE) ×4
GOWN STRL REUS W/TWL LRG LVL3 (GOWN DISPOSABLE) ×9 IMPLANT
KIT TURNOVER KIT A (KITS) ×3 IMPLANT
MANIFOLD NEPTUNE II (INSTRUMENTS) ×3 IMPLANT
NEEDLE HYPO 25X1 1.5 SAFETY (NEEDLE) ×3 IMPLANT
NS IRRIG 1000ML POUR BTL (IV SOLUTION) ×3 IMPLANT
PACK MINOR (CUSTOM PROCEDURE TRAY) ×3 IMPLANT
PAD ARMBOARD 7.5X6 YLW CONV (MISCELLANEOUS) ×3 IMPLANT
SET BASIN LINEN APH (SET/KITS/TRAYS/PACK) ×3 IMPLANT
SUT MNCRL AB 4-0 PS2 18 (SUTURE) ×3 IMPLANT
SUT SILK 2 0 SH (SUTURE) ×3 IMPLANT
SUT VIC AB 3-0 SH 27 (SUTURE) ×2
SUT VIC AB 3-0 SH 27X BRD (SUTURE) ×1 IMPLANT
SYR CONTROL 10ML LL (SYRINGE) ×3 IMPLANT

## 2019-06-01 NOTE — Anesthesia Postprocedure Evaluation (Signed)
Anesthesia Post Note  Patient: Kayla Wilkinson  Procedure(s) Performed: EXCISIONAL BREAST BIOPSY WITH NEEDLE LOCALIZATION (Right Breast)  Patient location during evaluation: PACU Anesthesia Type: General Level of consciousness: awake and alert and oriented Pain management: pain level controlled Vital Signs Assessment: post-procedure vital signs reviewed and stable Respiratory status: spontaneous breathing Cardiovascular status: blood pressure returned to baseline and stable Postop Assessment: no apparent nausea or vomiting and adequate PO intake Anesthetic complications: no     Last Vitals:  Vitals:   06/01/19 1213 06/01/19 1234  BP: 119/86 133/85  Pulse: 94 87  Resp: 19 18  Temp:  36.8 C  SpO2: 96% 94%    Last Pain:  Vitals:   06/01/19 1234  TempSrc: Oral  PainSc: 5                  Baraa Tubbs

## 2019-06-01 NOTE — Transfer of Care (Signed)
Immediate Anesthesia Transfer of Care Note  Patient: Kayla Wilkinson  Procedure(s) Performed: EXCISIONAL BREAST BIOPSY WITH NEEDLE LOCALIZATION (Right Breast)  Patient Location: PACU  Anesthesia Type:General  Level of Consciousness: awake  Airway & Oxygen Therapy: Patient Spontanous Breathing  Post-op Assessment: Report given to RN  Post vital signs: Reviewed  Last Vitals:  Vitals Value Taken Time  BP 120/83 06/01/19 1200  Temp    Pulse 93 06/01/19 1201  Resp 15 06/01/19 1201  SpO2 97 % 06/01/19 1201  Vitals shown include unvalidated device data.  Last Pain:  Vitals:   06/01/19 0957  TempSrc: Oral  PainSc: 0-No pain      Patients Stated Pain Goal: 8 ([id number])  Complications: No apparent anesthesia complications

## 2019-06-01 NOTE — Op Note (Signed)
[facility] Operative Note  06/01/19  Preoperative Diagnosis: Right breast mass on mammogram   Postoperative Diagnosis: Same   Procedure(s) Performed: Excisional biopsy after needle localization    Surgeon: Lanell Matar. Constance Haw, MD   Assistants: No qualified resident was available    Anesthesia: General endotracheal   Anesthesiologist: Lenice Llamas, MD    Specimens: Right breast tissue (short superior, long lateral suture)    Estimated Blood Loss: Minimal   Blood Replacement: None    Complications: None   Wound Class: Clean    Operative Indications: Ms. Luvenia Heller is a 48 yo who had an abnormal mammogram and biopsy 2019, and continues to have changes in the biopsy site that were concerning. 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 We were asked to perform an excisional biopsy due to the continued changes on mammogram.  We discussed the risk of the procedure including but not limited to bleeding, infection, cosmetic changes to the breast, need for further surgery, and she opted to proceed.   Findings: fibrocystic breast tissue    Procedure: The patient was taken to the operating room and placed supine. General endotracheal anesthesia was induced. Intravenous antibiotics were administered per protocol.  The wire was trimmed to prevent dislodgement. The right breast was prepared and draped in the usual sterile fashion.  The mammogram images had been reviewed and were up in the room.   A curvilinear incisions was made on the breast and deepened down to the breast tissue. The wire was brought into the field. A 0 silk stay suture was placed to help prevent dislodgement of the wire. With careful dissection, the breast tissue around the wire was excised using a combinations of electrocautery and sharp dissection.  The wire was dissected out in its entirety with a representative mass of breast tissue excised around it and inferior to the wire.   The mass was marked with a short superior silk suture and a long lateral silk suture.  The mass went to radiology who confirmed removal of the biopsy clip and wire and the corresponding lesion on mammogram.   The cavity was made hemostatic. The deep space was not closed to allow for seroma formation for cosmesis.  The dermal layer was closed with 3-0 Vicryl interrupted suture, and the skin was closed with 4-0 Monocryl running suture and Dermabond.   Final inspection revealed acceptable hemostasis. All counts were correct at the end of the case. The patient was awakened from anesthesia and extubated without complication.  The patient went to the PACU in stable condition.   Curlene Labrum, MD [facility] [address] [place], [place] [address] [phone] (office)

## 2019-06-01 NOTE — Anesthesia Procedure Notes (Signed)
Procedure Name: LMA Insertion Date/Time: 06/01/2019 10:56 AM Performed by: Ollen Bowl, CRNA Pre-anesthesia Checklist: Patient identified, Patient being monitored, Emergency Drugs available, Timeout performed and Suction available Patient Re-evaluated:Patient Re-evaluated prior to induction Oxygen Delivery Method: Circle System Utilized Preoxygenation: Pre-oxygenation with 100% oxygen Induction Type: IV induction Ventilation: Mask ventilation without difficulty LMA: LMA inserted LMA Size: 3.0 Number of attempts: 1 Placement Confirmation: positive ETCO2 and breath sounds checked- equal and bilateral

## 2019-06-01 NOTE — Discharge Instructions (Signed)
Discharge Instructions:  Common Complaints: Pain, swelling, and bruising at the incision site is common.  Some nausea is common and poor appetite after anesthesia. The main goal is to stay hydrated the first few days after surgery.   Diet/ Activity: Diet as tolerated.  Shower per your regular routine daily.  Do not take hot showers. Take warm showers that are less than 10 minutes. Rest and listen to your body, but do not remain in bed all day.  Walk everyday for at least 15-20 minutes. Deep cough and move around every 1-2 hours in the first few days after surgery.  Do not lift heavy objects or do anything that pulls on your incision for the first 1-2 weeks.  Do not pick at the dermabond glue on your incision sites.  This glue film will remain in place for 1-2 weeks and will start to peel off.  Do not place lotions or balms on your incision unless instructed to specifically by Dr. Constance Haw.   Medication: Take tylenol and ibuprofen as needed for pain control, alternating every 4-6 hours.  Example:  Tylenol 1000mg  @ 6am, 12noon, 6pm, 51midnight (Do not exceed 4000mg  of tylenol a day). Ibuprofen 800mg  @ 9am, 3pm, 9pm, 3am (Do not exceed 3600mg  of ibuprofen a day).  Take Roxicodone for breakthrough pain every 4 hours.  Take Colace for constipation related to narcotic pain medication. If you do not have a bowel movement in 2 days, take Miralax over the counter.  Drink plenty of water to also prevent constipation.   Contact Information: If you have questions or concerns, please call our office, [phone], Monday- Thursday 8AM-5PM and Friday 8AM-12Noon.  If it is after hours or on the weekend, please call [facility], [phone], and ask to speak to the surgeon on call for Dr. Constance Haw at [facility].    Excisional breast biopsy, Care After This sheet gives you information about how to care for yourself after your procedure. Your health care provider may also give you more specific  instructions. If you have problems or questions, contact your health care provider. What can I expect after the procedure? After the procedure, it is common to have:  Breast swelling.  Breast tenderness.  Stiffness in your arm or shoulder.  A change in the shape and feel of your breast.  Scar tissue that feels hard to the touch in the area where the lump was removed. Follow these instructions at home: Medicines  Take over-the-counter and prescription medicines only as told by your health care provider.  Take your antibiotic medicine as told by your health care provider. Do not stop taking the antibiotic even if you start to feel better.  If you are taking prescription pain medicine, take actions to prevent or treat constipation. Your health care provider may recommend that you: ? Drink enough fluid to keep your urine pale yellow. ? Eat foods that are high in fiber, such as fresh fruits and vegetables, whole grains, and beans. ? Limit foods that are high in fat and processed sugars, such as fried and sweet foods. ? Take an over-the-counter or prescription medicine for constipation. Bathing Do not take baths, swim, or use a hot tub until your health care provider approves.  You may shower.  Incision care      Follow instructions from your health care provider about how to take care of your incision. Make sure you: ? Wash your hands with soap and water before you change your bandage (dressing). If soap  and water are not available, use hand sanitizer. ? Change your dressing as told by your health care provider. ? Leave stitches (sutures), skin glue, or adhesive strips in place. These skin closures may need to stay in place for 2 weeks or longer. If adhesive strip edges start to loosen and curl up, you may trim the loose edges. Do not remove adhesive strips completely unless your health care provider tells you to do that.  Check your incision area every day for signs of infection.  Check for: ? Redness, swelling, or pain. ? Fluid or blood. ? Warmth. ? Pus or a bad smell.  Keep your dressing clean and dry.  If you were sent home with a surgical drain in place, follow instructions from your health care provider about emptying it. Activity  Return to your normal activities as told by your health care provider. Ask your health care provider what activities are safe for you.  Be careful to avoid any activities that could cause an injury to your arm on the side of your surgery.  Do not lift anything that is heavier than 10 lb (4.5 kg), or the limit that you are told, until your health care provider says that it is safe. Avoid lifting with the arm that is on the side of your surgery.  Do not carry heavy objects on your shoulder on the side of your surgery.  After your drain is removed, you should perform exercises to keep your arm from getting stiff and swollen. Talk with your health care provider about which exercises are safe for you. General instructions  Do not drive or use heavy machinery while taking prescription pain medicine.  Wear a supportive bra as told by your health care provider.  Raise (elevate) your arm above the level of your heart while you are sitting or lying down.  Do not wear tight jewelry on your arm, wrist, or fingers on the side of your surgery.  Keep all follow-up visits as told by your health care provider. This is important. ? You may need to be screened for extra fluid around the lymph nodes (lymphedema). Follow instructions from your health care provider about how often you should be checked.  If you had any lymph nodes removed during your procedure, be sure to tell all of your health care providers. This is important information to share before you are involved in certain procedures, such as having blood tests or having your blood pressure taken. Contact a health care provider if:  You develop a rash.  You have a fever.  Your pain  medicine is not working.  Your swelling, weakness, or numbness in your arm has not improved after a few weeks.  You have new swelling in your breast or arm.  You have redness, swelling, or pain in your incision area.  You have fluid or blood coming from your incision.  Your incision feels warm to the touch.  You have pus or a bad smell coming from your incision. Get help right away if:  You have very bad pain in your breast or arm.  You have chest pain.  You have difficulty breathing. Summary  After the procedure, it is common to have breast tenderness, swelling, and stiffness in your arm and shoulder.  Follow instructions from your health care provider about how to take care of your incision.  Do not lift anything that is heavier than 10 lb (4.5 kg), or the limit that you are told, until your health  care provider says that it is safe. Avoid lifting with the arm that is on the side of your surgery.  If you had any lymph nodes removed during your procedure, be sure to tell all of your health care providers. This is important information to share before you are involved in certain procedures, such as having blood tests or having your blood pressure taken. This information is not intended to replace advice given to you by your health care provider. Make sure you discuss any questions you have with your health care provider. Document Released: 07/09/2006 Document Revised: 10/01/2018 Document Reviewed: 03/05/2016 Elsevier Patient Education  2020 [employer].

## 2019-06-01 NOTE — Anesthesia Preprocedure Evaluation (Signed)
Anesthesia Evaluation  Patient identified by MRN, date of birth, ID band Patient awake    Reviewed: Allergy & Precautions, NPO status , Patient's Chart, lab work & pertinent test results, reviewed documented beta blocker date and time   Airway Mallampati: I  TM Distance: >3 FB Neck ROM: Full    Dental no notable dental hx. (+) Teeth Intact   Pulmonary neg pulmonary ROS, former smoker,    Pulmonary exam normal breath sounds clear to auscultation       Cardiovascular Exercise Tolerance: Good hypertension, Pt. on home beta blockers and Pt. on medications negative cardio ROS Normal cardiovascular examI Rhythm:Regular Rate:Normal     Neuro/Psych negative neurological ROS  negative psych ROS   GI/Hepatic Neg liver ROS, GERD  Medicated and Controlled,Denies GERD Sx today -took prilosec this AM   Endo/Other  negative endocrine ROSdiabetes, Type 2, Oral Hypoglycemic Agents  Renal/GU negative Renal ROS  negative genitourinary   Musculoskeletal negative musculoskeletal ROS (+)   Abdominal   Peds negative pediatric ROS (+)  Hematology negative hematology ROS (+)   Anesthesia Other Findings   Reproductive/Obstetrics negative OB ROS                             Anesthesia Physical Anesthesia Plan  ASA: II  Anesthesia Plan: General   Post-op Pain Management:    Induction: Intravenous  PONV Risk Score and Plan: 3 and Midazolam, Ondansetron, Dexamethasone and Treatment may vary due to age or medical condition  Airway Management Planned: LMA  Additional Equipment:   Intra-op Plan:   Post-operative Plan:   Informed Consent: I have reviewed the patients History and Physical, chart, labs and discussed the procedure including the risks, benefits and alternatives for the proposed anesthesia with the patient or authorized representative who has indicated his/her understanding and acceptance.      Dental advisory given  Plan Discussed with: CRNA  Anesthesia Plan Comments: (Plan Full PPE use  Plan GA(LMA) vs. GETA as needed d/w pt -WTP with same after Q&A)        Anesthesia Quick Evaluation

## 2019-06-01 NOTE — Interval H&P Note (Signed)
History and Physical Interval Note:  06/01/2019 10:07 AM  Kayla Wilkinson  has presented today for surgery, with the diagnosis of right breast mass.  The various methods of treatment have been discussed with the patient and family. After consideration of risks, benefits and other options for treatment, the patient has consented to  Procedure(s): EXCISIONAL BREAST BIOPSY WITH NEEDLE LOCALIZATION (Right) as a surgical intervention.  The patient's history has been reviewed, patient examined, no change in status, stable for surgery.  I have reviewed the patient's chart and labs.  Questions were answered to the patient's satisfaction.    No changes. Marked.   Virl Cagey

## 2019-06-03 LAB — SURGICAL PATHOLOGY

## 2019-06-06 ENCOUNTER — Encounter (HOSPITAL_COMMUNITY): Payer: Self-pay

## 2019-06-06 ENCOUNTER — Encounter (HOSPITAL_COMMUNITY): Payer: Self-pay | Admitting: General Surgery

## 2019-06-06 ENCOUNTER — Telehealth (INDEPENDENT_AMBULATORY_CARE_PROVIDER_SITE_OTHER): Payer: Self-pay | Admitting: General Surgery

## 2019-06-06 ENCOUNTER — Telehealth: Payer: Self-pay

## 2019-06-06 DIAGNOSIS — N6310 Unspecified lump in the right breast, unspecified quadrant: Secondary | ICD-10-CM

## 2019-06-06 NOTE — Telephone Encounter (Signed)
[facility]  Patient came to OR Short stay complaining of oozing from her wound.   Pathology: A. 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   There is bruising. The dermabond is peeling but no extending redness or signs of infection. Patient will keep her 12/10 appt at 2:30pm.    Curlene Labrum, [place] [address] [place], [place] [address] [id number] [phone] (office)

## 2019-06-06 NOTE — Telephone Encounter (Signed)
Patient came into office today regarding her stitches that are bleeding. I told her that I did not have a provider in the office and that I could make her an appointment for tomorrow or she could go to the ED and see the on call MD or she could contact her PCP. She then raises her voice and tells me that she tried to call a number on her discharge summary and she was not getting anywhere with the hospital. She then tells me that our office hours are 8-5 m-f, when I told her the correct office hours she raised her voice again and said that was not what the paper said. I gave patient an appointment for tomorrow. Patient then said she needed a work note and I told her I would have it ready when she comes in tomorrow.

## 2019-06-06 NOTE — OR Nursing (Signed)
Patient requesting a work note for her surgery which was performed on Wednesday, June 01, 2019 by Dr Constance Haw. She was excused for being out of work for the next 7 days.   Thank you!    Kayla Grates RN

## 2019-06-07 ENCOUNTER — Ambulatory Visit: Payer: BC Managed Care – PPO | Admitting: General Surgery

## 2019-06-13 DIAGNOSIS — Z6838 Body mass index (BMI) 38.0-38.9, adult: Secondary | ICD-10-CM | POA: Diagnosis not present

## 2019-06-13 DIAGNOSIS — Z0001 Encounter for general adult medical examination with abnormal findings: Secondary | ICD-10-CM | POA: Diagnosis not present

## 2019-06-13 DIAGNOSIS — R0789 Other chest pain: Secondary | ICD-10-CM | POA: Diagnosis not present

## 2019-06-16 ENCOUNTER — Encounter: Payer: Self-pay | Admitting: General Surgery

## 2019-06-16 ENCOUNTER — Other Ambulatory Visit: Payer: Self-pay

## 2019-06-16 ENCOUNTER — Ambulatory Visit (INDEPENDENT_AMBULATORY_CARE_PROVIDER_SITE_OTHER): Payer: Self-pay | Admitting: General Surgery

## 2019-06-16 VITALS — BP 122/75 | HR 94 | Temp 98.6°F | Resp 16 | Ht 63.0 in | Wt 214.0 lb

## 2019-06-16 DIAGNOSIS — N6310 Unspecified lump in the right breast, unspecified quadrant: Secondary | ICD-10-CM

## 2019-06-16 NOTE — Progress Notes (Signed)
[facility] Note   HPI:  48 y.o. Female presents to clinic for post-op follow-up evaluation of her right breast. She had some bruising post op and had been concerned. She had no signs of infection but had some minor drainage.. Patient reports continued minor and improving drainage. No fevers or chills.   Pathology: FINAL MICROSCOPIC DIAGNOSIS:   A. 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  Cardiovascular:     Rate and Rhythm: Normal rate.  Pulmonary:     Effort: Pulmonary effort is normal.  Chest:     Comments: Right breast incision with improved bruising, no erythema and no drainage on gauze, healing   Assessment:  48 y.o. yo Female s/p excisional biopsy. She is healing and there are no signs of infection. Her pathology was benign and demonstrated complex sclerosing lesion and usual ductal hyperplasia. She needs no further surgery or intervention.   Plan:  - Continue with annual exams and mammogram   - PRN follow up  All of the above recommendations were discussed with the patient, and all of patient's questions were answered to her expressed satisfaction.  Curlene Labrum, MD [facility] [address] [place], [place] [address] [phone] (office)

## 2019-06-29 ENCOUNTER — Other Ambulatory Visit: Payer: Self-pay

## 2019-06-29 ENCOUNTER — Encounter: Payer: Self-pay | Admitting: Internal Medicine

## 2019-06-29 ENCOUNTER — Ambulatory Visit (INDEPENDENT_AMBULATORY_CARE_PROVIDER_SITE_OTHER): Payer: BC Managed Care – PPO | Admitting: Internal Medicine

## 2019-06-29 VITALS — BP 141/81 | HR 95 | Temp 96.6°F | Ht 63.0 in | Wt 213.0 lb

## 2019-06-29 DIAGNOSIS — R0789 Other chest pain: Secondary | ICD-10-CM | POA: Diagnosis not present

## 2019-06-29 DIAGNOSIS — E1165 Type 2 diabetes mellitus with hyperglycemia: Secondary | ICD-10-CM | POA: Diagnosis not present

## 2019-06-29 MED ORDER — METOPROLOL TARTRATE 100 MG PO TABS: ORAL_TABLET | ORAL | 0 refills | Status: DC

## 2019-06-29 NOTE — Patient Instructions (Signed)
Medication Instructions:  Your physician recommends that you continue on your current medications as directed. Please refer to the Current Medication list given to you today.  Take Lopressor 100 mg 2 hours prior to CT Scan   *If you need a refill on your cardiac medications before your next appointment, please call your pharmacy*  Lab Work: Your physician recommends that you return for lab work in: Just before your CT Scan    If you have labs (blood work) drawn today and your tests are completely normal, you will receive your results only by: Marland Kitchen MyChart Message (if you have MyChart) OR . A paper copy in the mail If you have any lab test that is abnormal or we need to change your treatment, we will call you to review the results.  Testing/Procedures: CT Scan   Follow-Up: At [facility], you and your health needs are our priority.  As part of our continuing mission to provide you with exceptional heart care, we have created designated Provider Care Teams.  These Care Teams include your primary Cardiologist (physician) and Advanced Practice Providers (APPs -  Physician Assistants and Nurse Practitioners) who all work together to provide you with the care you need, when you need it.  Your next appointment:    To Be Determined by Testing   The format for your next appointment:   In Person  Provider:   You may see No primary care provider on file. or one of the following Advanced Practice Providers on your designated Care Team:    Bernerd Pho, PA-C   Ermalinda Barrios, [place]    Other Instructions Thank you for choosing [facility]!  Your cardiac CT will be scheduled at one of the below locations:   [facility] [address] [place], [place] [address] ([address] [place], [place] [address] [phone]  If scheduled at [facility], please arrive at the [facility] main entrance of [facility] 30-45 minutes prior to test start time. Proceed to the [facility] Radiology Department (first floor) to check-in and test prep.  If scheduled at [facility], please arrive 15 mins early for check-in and test prep.  Please follow these instructions carefully (unless otherwise directed):  Hold all erectile dysfunction medications at least 3 days (72 hrs) prior to test.  On the Night Before the Test: . Be sure to Drink plenty of water. . Do not consume any caffeinated/decaffeinated beverages or chocolate 12 hours prior to your test. . Do not take any antihistamines 12 hours prior to your test. . If the patient has contrast allergy: ? Patient will need a prescription for Prednisone and very clear instructions (as follows): 1. Prednisone 50 mg - take 13 hours prior to test 2. Take another Prednisone 50 mg 7 hours prior to test 3. Take another Prednisone 50 mg 1 hour prior to test 4. Take Benadryl 50 mg 1 hour prior to test . Patient must complete all four doses of above prophylactic medications. . Patient will need a ride after test due to Benadryl.  On the Day of the Test: . Drink plenty of water. Do not drink any water within one hour of the test. . Do not eat any food 4 hours prior to the test. . You may take your regular medications prior to the test.  . Take metoprolol (Lopressor) two hours prior to test. . HOLD  Furosemide/Hydrochlorothiazide morning of the test. . FEMALES- please wear underwire-free bra if available   *For Clinical Staff only. Please instruct patient the following:*        -Drink plenty of water       -Hold Furosemide/hydrochlorothiazide morning of the test       -Take metoprolol (Lopressor) 2 hours prior to test (if applicable).                  -If HR is less than 55 BPM- No Beta Blocker                -IF HR is greater than 55 BPM and patient is less than or equal to 2 yrs old Lopressor  100mg  x1.                -If HR is greater than 55 BPM and patient is greater than 66 yrs old Lopressor 50 mg x1.     Do not give Lopressor to patients with an allergy to lopressor or anyone with asthma or active COPD symptoms (currently taking steroids).       After the Test: . Drink plenty of water. . After receiving IV contrast, you may experience a mild flushed feeling. This is normal. . On occasion, you may experience a mild rash up to 24 hours after the test. This is not dangerous. If this occurs, you can take Benadryl 25 mg and increase your fluid intake. . If you experience trouble breathing, this can be serious. If it is severe call 911 IMMEDIATELY. If it is mild, please call our office. . If you take any of these medications: Glipizide/Metformin, Avandament, Glucavance, please do not take 48 hours after completing test unless otherwise instructed.   Once we have confirmed authorization from your insurance company, we will call you to set up a date and time for your test.   For non-scheduling related questions, please contact the cardiac imaging nurse navigator should you have any questions/concerns: Marchia Bond, RN Navigator Cardiac Imaging [facility] [phone] Office

## 2019-06-29 NOTE — Progress Notes (Signed)
Cardiology Office Note   Date:  06/29/2019   ID:  Kayla Wilkinson, DOB December 08, 1970, MRN [id number]  PCP:  Cory Munch, PA-C  Cardiologist:   Dorris Carnes, MD   Patient referred by Raphael Gibney for abnormal EKG and cardiac evaluation.    History of Present Illness: Kayla Wilkinson is a 48 y.o. female with no known coronary artery disease.  She has history of diabetes that was diagnosed in 2009 (A1c 9-ish).  She was seen recently in presurgical area and had an EKG done this was read out as (remote anterior and inferior MI (age undetermined).  The patient notes intermittent episodes of chest discomfort She describes them as sharp.  They can last about an hour SHe will get radiation to her neck (he sees a vein bulge) and left arm.  Often occur when she is not doing things.  She will get up and move around to take her mind off of it.  The patient had an episode earlier this fall where she woke up with a headache and had a blood in her left eye concerned that this might be associated.  There was no associated chest pain at this..  The patient is not very active She has a sedentary job.  She has put on weight since start of  Covid.  She does have a history of tobacco use in the past.  Also drug use.  She has quit both.     Current Meds  Medication Sig  . aspirin EC 81 MG tablet Take 81 mg by mouth daily.  Marland Kitchen docusate sodium (COLACE) 100 MG capsule Take 1 capsule (100 mg total) by mouth 2 (two) times daily.  Marland Kitchen glipiZIDE (GLUCOTROL) 5 MG tablet Take 5 mg by mouth daily.   . Insulin Glargine (LANTUS SOLOSTAR) 100 UNIT/ML Solostar Pen Inject 50 Units into the skin daily at 10 pm.  . losartan (COZAAR) 25 MG tablet Take 25 mg by mouth daily.  . metFORMIN (GLUCOPHAGE) 1000 MG tablet Take 1,000 mg by mouth 2 (two) times daily with a meal.  . metoprolol tartrate (LOPRESSOR) 25 MG tablet Take 25 mg by mouth 2 (two) times daily.   . naproxen (NAPROSYN) 500 MG tablet Take 1 tablet (500  mg total) by mouth 2 (two) times daily with a meal.  . omeprazole (PRILOSEC) 40 MG capsule Take 40 mg by mouth 2 (two) times daily before a meal.   . oxyCODONE (ROXICODONE) 5 MG immediate release tablet Take 1 tablet (5 mg total) by mouth every 4 (four) hours as needed.  . phentermine 37.5 MG capsule Take 37.5 mg by mouth every morning.  Marland Kitchen STEGLATRO 5 MG TABS Take 5 mg by mouth every morning.   . valACYclovir (VALTREX) 1000 MG tablet Take 1 tablet (1,000 mg total) by mouth 2 (two) times daily. X 10 days, then once daily thereafter     Allergies:   Patient has no known allergies.   Past Medical History:  Diagnosis Date  . Acid reflux   . Diabetes mellitus   . Hypertension     Past Surgical History:  Procedure Laterality Date  . BREAST BIOPSY Right 06/01/2019   Procedure: EXCISIONAL BREAST BIOPSY WITH NEEDLE LOCALIZATION;  Surgeon: Kayla Cagey, MD;  Location: [facility];  Service: General;  Laterality: Right;  . RECTOCELE REPAIR N/A 07/08/2016   Procedure: POSTERIOR REPAIR (RECTOCELE);  Surgeon: Kayla Kind, MD;  Location: [facility];  Service: Gynecology;  Laterality: N/A;  Social History:  The patient  reports that she quit smoking about 6 years ago. Her smoking use included cigarettes. She has a 0.38 pack-year smoking history. She has never used smokeless tobacco. She reports previous alcohol use. She reports that she does not use drugs.   Family History:  The patient's family history includes Breast cancer in an other family member; Cancer in an other family member; Cancer - Cervical in her maternal aunt; Diabetes in her maternal grandmother, mother, and son.    ROS:  Please see the history of present illness. All other systems are reviewed and  Negative to the above problem except as noted.    PHYSICAL EXAM: VS:  BP (!) 141/81   Pulse 95   Temp (!) 96.6 F (35.9 C)   Ht 5\' 3"  (1.6 m)   Wt 213 lb (96.6 kg)   BMI 37.73 kg/m   GEN: Morbidly obese 48 year old in no  acute distress  HEENT: normal  Neck: no JVD, carotid bruits, Cardiac: RRR; no murmurs, rubs, or gallops,.  No lower extremity edema  Respiratory:  clear to auscultation bilaterally, normal work of breathing Chest:   Tender  Brings on some of pain   GI: soft, nontender, distended. MS: BS deformity Moving all extremities   Skin: warm and dry Neuro: Cranial nerves II through XII grossly intact.  Moving all extremities Psych: Patient appears slightly anxious.   EKG:  EKG is not ordered today.  On 05/29/2019 EKG showed sinus tachycardia.  Possible anteroseptal MI inferior MI .  Poor R wave progression.   Lipid Panel No results found for: CHOL, TRIG, HDL, CHOLHDL, VLDL, LDLCALC, LDLDIRECT    Wt Readings from Last 3 Encounters:  06/29/19 213 lb (96.6 kg)  06/16/19 214 lb (97.1 kg)  05/30/19 209 lb (94.8 kg)      ASSESSMENT AND PLAN:  1.  Chest pain patient has had chest pain/arm pain/neck pain for a while.  Atypical not associated with activity not sure if activity helps.  She does however have multiple risk factors.  Her EKG shows Q waves in 3 and F.  There is poor R wave progression in the anterior lateral leads which most likely is due to her body habitus, I am not convinced of an MI.  Infective overall not convinced of an MI by the inferior changes as well.  However based on her risk factors with diabetes, family history, I would recommend further evaluation.  She coronary artery disease needs to be evaluated.  I would recommend a CT coronary angiogram.  For now I would keep her on her same medicines.  2.  Hypertension blood pressure is fair.  Keep on current medicines for now  3 Diabetes It sounds like she has had poor control of the diabetes I would recommend a referral to endocrinologist (M.  Alzheimer).  She needs tighter control of her A1c  4.  Dyslipidemia  Patient is on Crestor continue will need to evaluate lipids.   Current medicines are reviewed at length with the  patient today.  The patient does not have concerns regarding medicines.  Signed, Dorris Carnes, MD  06/29/2019 8:57 AM    [address], [place], [place]  [address] Phone: [phone]; Fax: [phone]   [id number]         6

## 2019-08-08 ENCOUNTER — Telehealth (HOSPITAL_COMMUNITY): Payer: Self-pay | Admitting: Emergency Medicine

## 2019-08-08 ENCOUNTER — Encounter (HOSPITAL_COMMUNITY): Payer: Self-pay

## 2019-08-08 NOTE — Telephone Encounter (Signed)
Reaching out to patient to offer assistance regarding upcoming cardiac imaging study; pt verbalizes understanding of appt date/time, parking situation and where to check in, pre-test NPO status and medications ordered, and verified current allergies; name and call back number provided for further questions should they arise Rodd Heft RN Navigator Cardiac Imaging [facility] [phone] office [phone] cell 

## 2019-08-09 ENCOUNTER — Ambulatory Visit (HOSPITAL_COMMUNITY)
Admission: RE | Admit: 2019-08-09 | Discharge: 2019-08-09 | Disposition: A | Discharge status: Home / Self Care | Payer: BC Managed Care – PPO | Source: Ambulatory Visit | Attending: Internal Medicine | Admitting: Internal Medicine

## 2019-08-09 ENCOUNTER — Other Ambulatory Visit: Payer: Self-pay

## 2019-08-09 DIAGNOSIS — R9431 Abnormal electrocardiogram [ECG] [EKG]: Secondary | ICD-10-CM | POA: Diagnosis not present

## 2019-08-09 DIAGNOSIS — R0789 Other chest pain: Secondary | ICD-10-CM | POA: Diagnosis not present

## 2019-08-09 DIAGNOSIS — Z006 Encounter for examination for normal comparison and control in clinical research program: Secondary | ICD-10-CM

## 2019-08-09 LAB — POCT I-STAT CREATININE: Creatinine, Ser: 0.3 mg/dL — ABNORMAL LOW (ref 0.44–1.00)

## 2019-08-09 MED ORDER — METOPROLOL TARTRATE 5 MG/5ML IV SOLN
INTRAVENOUS | Status: AC
  Administered 2019-08-09: 5 mg via INTRAVENOUS
  Filled 2019-08-09: qty 15

## 2019-08-09 MED ORDER — NITROGLYCERIN 0.4 MG SL SUBL
SUBLINGUAL_TABLET | SUBLINGUAL | Status: AC
  Filled 2019-08-09: qty 2

## 2019-08-09 MED ORDER — METOPROLOL TARTRATE 5 MG/5ML IV SOLN
5.0000 mg | INTRAVENOUS | Status: DC | PRN
  Administered 2019-08-09 (×2): 5 mg via INTRAVENOUS

## 2019-08-09 MED ORDER — DILTIAZEM HCL 25 MG/5ML IV SOLN
5.0000 mg | Freq: Once | INTRAVENOUS | Status: AC
  Filled 2019-08-09: qty 5

## 2019-08-09 MED ORDER — IOHEXOL 350 MG/ML SOLN
80.0000 mL | Freq: Once | INTRAVENOUS | Status: AC | PRN
  Administered 2019-08-09: 80 mL via INTRAVENOUS

## 2019-08-09 MED ORDER — NITROGLYCERIN 0.4 MG SL SUBL
0.8000 mg | SUBLINGUAL_TABLET | Freq: Once | SUBLINGUAL | Status: AC
  Administered 2019-08-09: 0.8 mg via SUBLINGUAL

## 2019-08-09 MED ORDER — DILTIAZEM HCL 25 MG/5ML IV SOLN
INTRAVENOUS | Status: AC
  Administered 2019-08-09: 5 mg via INTRAVENOUS
  Filled 2019-08-09: qty 5

## 2019-08-09 MED ORDER — DILTIAZEM HCL 25 MG/5ML IV SOLN
5.0000 mg | Freq: Once | INTRAVENOUS | Status: AC
  Administered 2019-08-09: 16:00:00 5 mg via INTRAVENOUS
  Filled 2019-08-09: qty 5

## 2019-08-09 NOTE — Research (Unsigned)
Cadfem Informed Consent    Patient Name: Kayla Wilkinson   Subject met inclusion and exclusion criteria.  The informed consent form, study requirements and expectations were reviewed with the subject and questions and concerns were addressed prior to the signing of the consent form.  The subject verbalized understanding of the trail requirements.  The subject agreed to participate in the CADFEM trial and signed the informed consent.  The informed consent was obtained prior to performance of any protocol-specific procedures for the subject.  A copy of the signed informed consent was given to the subject and a copy was placed in the subject's medical record.   Neva Seat

## 2019-08-11 ENCOUNTER — Telehealth: Payer: Self-pay | Admitting: Internal Medicine

## 2019-08-11 NOTE — Telephone Encounter (Signed)
Pt had cardiac CT on Tuesday and she's not feeling right after having the contrast from the scan, made her feel hot and burn in her chest.   Stated they checked her kidneys and the lady told her it was 1.5  States she's light headed  Please call [phone]

## 2019-08-11 NOTE — Telephone Encounter (Signed)
Pt returned your call  Thank renee

## 2019-08-11 NOTE — Telephone Encounter (Addendum)
Has voicemail that is not set up yet, unable to reach patient   Per Dr Harrington Challenger calcium score is 0  Her creatinine is 0.3, not sure where patient       Fay Records, MD  08/10/2019 3:08 PM EST    Calcium score is 0  There does not appear to be an significant coronary artery disease I do not think blood flow problems exist in the heart to explain pains she is having  Possiby GI in origin  I would discuss with Dr Cheryll Dessert diabetes need to follow her lipids and make sure controlled Do not want to develop CAD by age 29, 86, etc. Sdt up for fasting.          Dr.Ross:  Patient wants to know what your thoughts are regarding her continuing to take beta blocker . She is concerned for long term effects. I told her to see with Collene Mares, PA-C who prescribed them for palpitations.

## 2019-08-12 NOTE — Telephone Encounter (Signed)
Attempt to reach, no voicemail-cc

## 2019-08-12 NOTE — Telephone Encounter (Signed)
Patient called requesting to speak with Tye Maryland in regards to her recent AVS

## 2019-09-05 DIAGNOSIS — N939 Abnormal uterine and vaginal bleeding, unspecified: Secondary | ICD-10-CM | POA: Diagnosis not present

## 2019-09-05 DIAGNOSIS — N914 Secondary oligomenorrhea: Secondary | ICD-10-CM | POA: Diagnosis not present

## 2019-09-05 DIAGNOSIS — N898 Other specified noninflammatory disorders of vagina: Secondary | ICD-10-CM | POA: Diagnosis not present

## 2019-09-05 DIAGNOSIS — Z6838 Body mass index (BMI) 38.0-38.9, adult: Secondary | ICD-10-CM | POA: Diagnosis not present

## 2019-09-05 DIAGNOSIS — Z01419 Encounter for gynecological examination (general) (routine) without abnormal findings: Secondary | ICD-10-CM | POA: Diagnosis not present

## 2019-12-01 DIAGNOSIS — J069 Acute upper respiratory infection, unspecified: Secondary | ICD-10-CM | POA: Diagnosis not present

## 2019-12-01 DIAGNOSIS — J209 Acute bronchitis, unspecified: Secondary | ICD-10-CM | POA: Diagnosis not present

## 2019-12-23 DIAGNOSIS — J329 Chronic sinusitis, unspecified: Secondary | ICD-10-CM | POA: Diagnosis not present

## 2019-12-23 DIAGNOSIS — Z681 Body mass index (BMI) 19 or less, adult: Secondary | ICD-10-CM | POA: Diagnosis not present

## 2019-12-23 DIAGNOSIS — J069 Acute upper respiratory infection, unspecified: Secondary | ICD-10-CM | POA: Diagnosis not present

## 2019-12-29 ENCOUNTER — Emergency Department (HOSPITAL_COMMUNITY)
Admission: EM | Admit: 2019-12-29 | Discharge: 2019-12-29 | Disposition: A | Discharge status: Home / Self Care | Payer: BC Managed Care – PPO

## 2020-01-16 DIAGNOSIS — E1165 Type 2 diabetes mellitus with hyperglycemia: Secondary | ICD-10-CM | POA: Diagnosis not present

## 2020-01-16 DIAGNOSIS — Z6837 Body mass index (BMI) 37.0-37.9, adult: Secondary | ICD-10-CM | POA: Diagnosis not present

## 2020-01-16 DIAGNOSIS — T50Z95A Adverse effect of other vaccines and biological substances, initial encounter: Secondary | ICD-10-CM | POA: Diagnosis not present

## 2020-01-26 ENCOUNTER — Encounter: Payer: Self-pay | Admitting: Internal Medicine

## 2020-03-30 ENCOUNTER — Ambulatory Visit: Payer: BC Managed Care – PPO | Admitting: Gastroenterology

## 2020-03-30 ENCOUNTER — Ambulatory Visit (INDEPENDENT_AMBULATORY_CARE_PROVIDER_SITE_OTHER): Payer: BC Managed Care – PPO | Admitting: Gastroenterology

## 2020-03-30 ENCOUNTER — Encounter: Payer: Self-pay | Admitting: Gastroenterology

## 2020-03-30 ENCOUNTER — Other Ambulatory Visit: Payer: Self-pay

## 2020-03-30 ENCOUNTER — Encounter: Payer: Self-pay | Admitting: Internal Medicine

## 2020-03-30 ENCOUNTER — Telehealth: Payer: Self-pay | Admitting: *Deleted

## 2020-03-30 DIAGNOSIS — Z1211 Encounter for screening for malignant neoplasm of colon: Secondary | ICD-10-CM | POA: Diagnosis not present

## 2020-03-30 DIAGNOSIS — R1013 Epigastric pain: Secondary | ICD-10-CM | POA: Diagnosis not present

## 2020-03-30 NOTE — Progress Notes (Signed)
Primary Care Physician:  Ginger Organ  Referring Provider: Collene Mares, PA-C Primary Gastroenterologist:  Dr. Abbey Chatters  Chief Complaint  Patient presents with  . Consult    TCS never done prior  . Gastroesophageal Reflux    wants egd done with TCS. Reports she has been on acid reflux foir a while    HPI:   Kayla Wilkinson is a 49 y.o. female presenting today at the request of Collene Mares, PA-C due to abdominal pain and need for initial screening colonoscopy.    Chronic GERD. Intermittent epigastric pain. Feels like food sits heavy on stomach. Will feel like food will come back up. No solid food dysphagia. Was on Zantac since 2009 but changed to omeprazole in 2019. No NSAIDs.   Can't eat bread as it constipates her. Stays away from it. Feels the same with tortillas. Rare pinkish tinge with wiping. No blood in stool. Has some rectal pressure at times. Itching occasionally.    Past Medical History:  Diagnosis Date  . Acid reflux   . Diabetes mellitus   . Hypertension     Past Surgical History:  Procedure Laterality Date  . BREAST BIOPSY Right 06/01/2019   Procedure: EXCISIONAL BREAST BIOPSY WITH NEEDLE LOCALIZATION;  Surgeon: Virl Cagey, MD;  Location: [facility];  Service: General;  Laterality: Right;  . CARDIAC CATHETERIZATION    . RECTOCELE REPAIR N/A 07/08/2016   Procedure: POSTERIOR REPAIR (RECTOCELE);  Surgeon: Jonnie Kind, MD;  Location: [facility];  Service: Gynecology;  Laterality: N/A;    Current Outpatient Medications  Medication Sig Dispense Refill  . aspirin EC 81 MG tablet Take 81 mg by mouth daily.    Marland Kitchen glipiZIDE (GLUCOTROL) 5 MG tablet Take 5 mg by mouth daily.     . Insulin Glargine (LANTUS SOLOSTAR) 100 UNIT/ML Solostar Pen Inject 50 Units into the skin daily at 10 pm. 5 pen 2  . losartan (COZAAR) 25 MG tablet Take 25 mg by mouth daily.    . metFORMIN (GLUCOPHAGE) 1000 MG tablet Take 1,000 mg by mouth 2 (two) times daily with a  meal.    . metoprolol tartrate (LOPRESSOR) 25 MG tablet Take 25 mg by mouth 2 (two) times daily.     Marland Kitchen omeprazole (PRILOSEC) 40 MG capsule Take 40 mg by mouth 2 (two) times daily before a meal.     . rosuvastatin (CRESTOR) 10 MG tablet Take 10 mg by mouth daily.    Marland Kitchen STEGLATRO 5 MG TABS Take 5 mg by mouth every morning.   2  . valACYclovir (VALTREX) 1000 MG tablet Take 1 tablet (1,000 mg total) by mouth 2 (two) times daily. X 10 days, then once daily thereafter 30 tablet 11   No current facility-administered medications for this visit.    Allergies as of 03/30/2020  . (No Known Allergies)    Family History  Problem Relation Age of Onset  . Diabetes Mother   . Diabetes Maternal Grandmother   . Cancer Other   . Cancer - Cervical Maternal Aunt   . Diabetes Son   . Breast cancer Other   . Colon cancer Neg Hx   . Colon polyps Neg Hx     Social History   Socioeconomic History  . Marital status: Married    Spouse name: Not on file  . Number of children: Not on file  . Years of education: Not on file  . Highest education level: Not on file  Occupational History  .  Not on file  Tobacco Use  . Smoking status: Former Smoker    Packs/day: 0.25    Years: 1.50    Pack years: 0.37    Types: Cigarettes    Quit date: 07/02/2012    Years since quitting: 7.7  . Smokeless tobacco: Never Used  Vaping Use  . Vaping Use: Never used  Substance and Sexual Activity  . 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 Drug use: No  . Sexual activity: Not Currently    Birth control/protection: None  Other Topics Concern  . Not on file  Social History Narrative  . Not on file   Social Determinants of Health   Financial Resource Strain:   . Difficulty of Paying Living Expenses: Not on file  Food Insecurity:   . Worried About Charity fundraiser in the Last Year: Not on file  . Ran Out of Food in the Last Year: Not on file  Transportation Needs:   . Lack of Transportation  (Medical): Not on file  . Lack of Transportation (Non-Medical): Not on file  Physical Activity:   . Days of Exercise per Week: Not on file  . Minutes of Exercise per Session: Not on file  Stress:   . Feeling of Stress : Not on file  Social Connections:   . Frequency of Communication with Friends and Family: Not on file  . Frequency of Social Gatherings with Friends and Family: Not on file  . Attends Religious Services: Not on file  . Active Member of Clubs or Organizations: Not on file  . Attends Archivist Meetings: Not on file  . Marital Status: Not on file  Intimate Partner Violence:   . Fear of Current or Ex-Partner: Not on file  . Emotionally Abused: Not on file  . Physically Abused: Not on file  . Sexually Abused: Not on file    Review of Systems: Gen: Denies any fever, chills, fatigue, weight loss, lack of appetite.  CV: Denies chest pain, heart palpitations, peripheral edema, syncope.  Resp: Denies shortness of breath at rest or with exertion. Denies wheezing or cough.  GI: see HPI GU : Denies urinary burning, urinary frequency, urinary hesitancy MS: Denies joint pain, muscle weakness, cramps, or limitation of movement.  Derm: Denies rash, itching, dry skin Psych: Denies depression, anxiety, memory loss, and confusion Heme: see HPI  Physical Exam: BP 129/82   Pulse (!) 109   Temp (!) 97 F (36.1 C) (Oral)   Ht 5\' 3"  (1.6 m)   Wt 216 lb 6.4 oz (98.2 kg)   BMI 38.33 kg/m  General:   Alert and oriented. Pleasant and cooperative. Well-nourished and well-developed.  Head:  Normocephalic and atraumatic. Eyes:  Without icterus, sclera clear and conjunctiva pink.  Ears:  Normal auditory acuity. Mouth:  Mask in place Lungs:  Clear to auscultation bilaterally. No wheezes, rales, or rhonchi. No distress.  Heart:  S1, S2 present without murmurs appreciated.  Abdomen:  +BS, soft, non-tender and non-distended. No HSM noted. No guarding or rebound. No masses  appreciated.  Rectal:  Deferred  Msk:  Symmetrical without gross deformities. Normal posture. Extremities:  Without edema. Neurologic:  Alert and  oriented x4;  grossly normal neurologically. Skin:  Intact without significant lesions or rashes. Psych:  Alert and cooperative. Normal mood and affect.  ASSESSMENT: Esabella Stockinger Wilkinson is a 49 y.o. female presenting today with history of chronic GERD, noting intermittent epigastric discomfort and  sensation of "fullness" but no dysphagia, and need for initial screening colonoscopy.  Currently taking omeprazole BID, and she denies NSAIDs. Will pursue EGD at time of colonoscopy. No significant alarm signs/symptoms.   No prior colonoscopy, and she has no family history of colorectal cancer or polyps. Very scant amount of tissue hematochezia with wiping, and her clinical report is consistent with likely Grade 2 hemorrhoids. Discussed banding after colonoscopy likely.    PLAN:  Proceed with colonoscopy/EGD by Dr. Abbey Chatters  in near future: the risks, benefits, and alternatives have been discussed with the patient in detail. The patient states understanding and desires to proceed.   Benefiber daily  Continue PPI BID  Return in 3 months for consideration of banding if appropriate   Annitta Needs, PhD, [facility]

## 2020-03-30 NOTE — Patient Instructions (Signed)
We are arranging a colonoscopy and upper endoscopy in the near future.  I recommend starting with 2 teaspoons Benefiber every day, increasing if needed and as tolerated.  I will see you in 3 months for possible hemorrhoid banding!  It was a pleasure to see you today. I want to create trusting relationships with patients to provide genuine, compassionate, and quality care. I value your feedback. If you receive a survey regarding your visit,  I greatly appreciate you taking time to fill this out.   Annitta Needs, PhD, ANP-BC [facility] Gastroenterology

## 2020-03-30 NOTE — Telephone Encounter (Signed)
Called pt to schedule TCS/EGD with Dr. Abbey Chatters, asa 3. She is scheduled for 11/23 at 12:$5pm. Patient aware will needs pre-op/covid test prior. Advised will mail instructions with this appt. Confirmed mailing address.   Per AIM: The following solutions for the service date entered do not require Pre-Authorization by AIM

## 2020-04-02 ENCOUNTER — Encounter: Payer: Self-pay | Admitting: *Deleted

## 2020-04-17 ENCOUNTER — Telehealth: Payer: Self-pay | Admitting: Internal Medicine

## 2020-04-17 NOTE — Telephone Encounter (Signed)
Called pt. She has rescheduled to 12/14 at 7:30am. Aware will mail new instructions. Called endo and made aware of change.

## 2020-04-17 NOTE — Telephone Encounter (Signed)
Pt is scheduled with Dr Abbey Chatters on 11/23 in the afternoon. Pt is wanting to reschedule procedure time for early in the morning due to her anxiety and panic attacks. [phone]

## 2020-04-18 ENCOUNTER — Telehealth: Payer: Self-pay | Admitting: Internal Medicine

## 2020-04-18 NOTE — Telephone Encounter (Signed)
LMOVM for pt 

## 2020-04-18 NOTE — Telephone Encounter (Signed)
PATENT CALLED AND NEEDS TO RESCHEDULE HER PROCEDURE

## 2020-04-24 IMAGING — CT CT HEART MORP W/ CTA COR W/ SCORE W/ CA W/CM &/OR W/O CM
4 of 7 series · 8 of 20 positions shown, 9 images · IV contrast (APPLIED)
Comparison: None.
COMPARISON: None.

Addendum:
EXAM:
OVER-READ INTERPRETATION  CT CHEST

The following report is an over-read performed by radiologist Dr.
over-read does not include interpretation of cardiac or coronary
anatomy or pathology. The coronary CTA interpretation by the
cardiologist is attached.
CLINICAL DATA: Chest pain
Cardiac CTA
MEDICATIONS:
Sub lingual nitro. 4mg x 2
TECHNIQUE: The patient was scanned on a Siemens [REDACTED]ice scanner. Gantry
rotation speed was 250 msecs. Collimation was 0.6 mm. A 100 kV
prospective scan was triggered in the ascending thoracic aorta at
35-75% of the R-R interval. Average HR during the scan was 70 bpm.
The 3D data set was interpreted on a dedicated work station using
MPR, MIP and VRT modes. A total of 80cc of contrast was used.

[Series 6: best diast 71 % · axial · 0.39mm/px · z∈[+126,+161]mm · 2 of 268 slices shown]
[im 90/268  vessel]
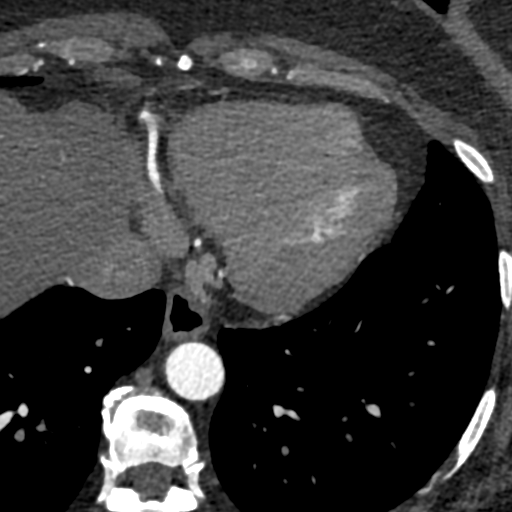
[im 179/268  vessel]
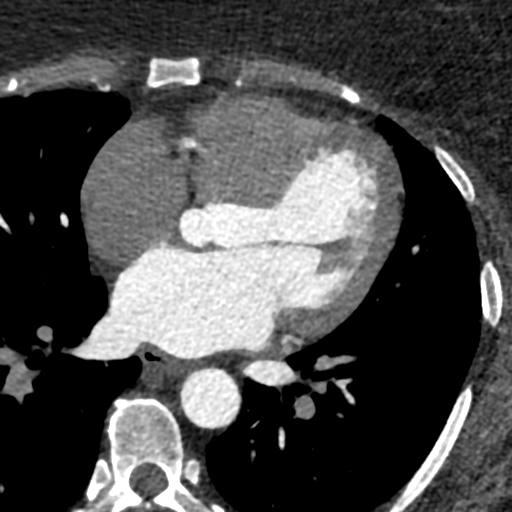

[Series 7: best syst · axial · 0.39mm/px · z∈[+126,+161]mm · 2 of 268 slices shown, 3 images]
[im 90/268  vessel]
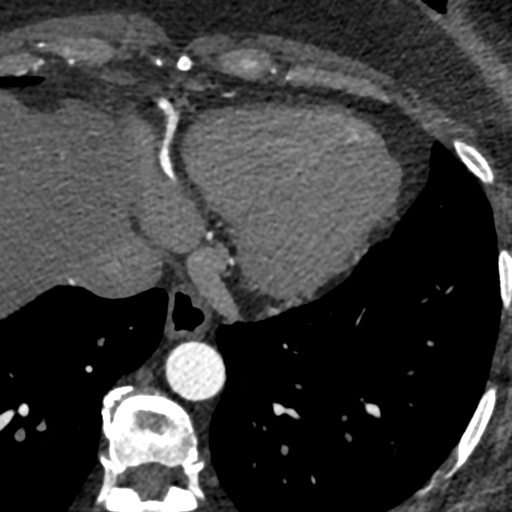
[im 90/268  lung]
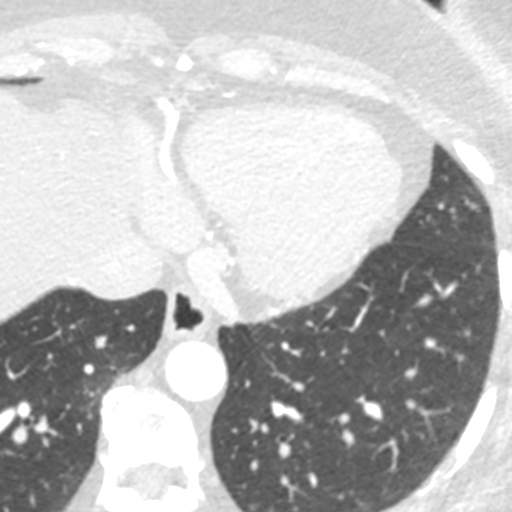
[im 179/268  vessel]
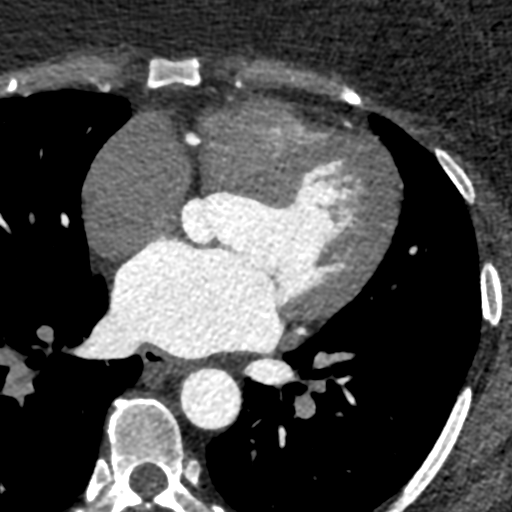

[Series 8: ts diast sharp · axial · 0.39mm/px · z∈[+126,+161]mm · 2 of 268 slices shown]
[im 90/268  lung]
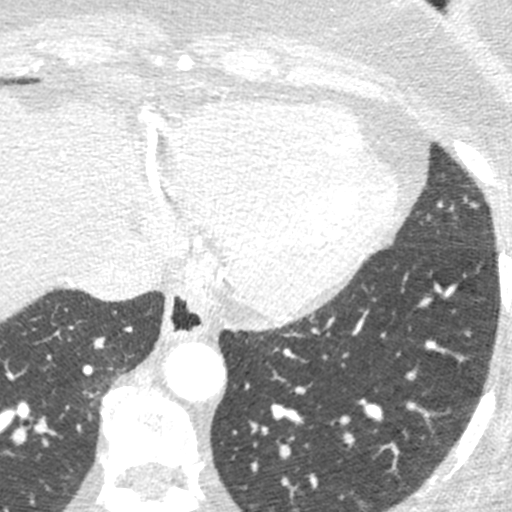
[im 179/268  lung]
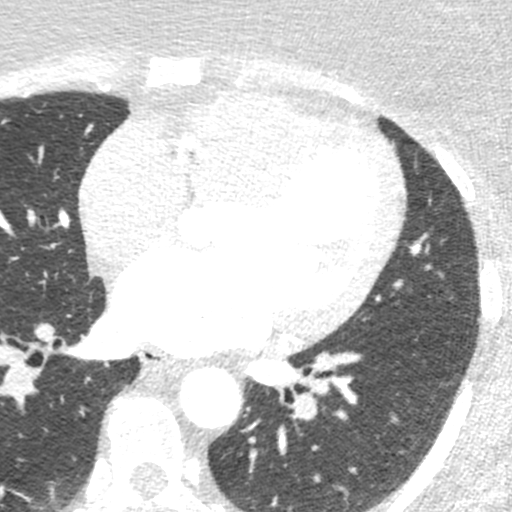

[Series 9: ts syst sharp · axial · 0.39mm/px · z∈[+126,+161]mm · 2 of 268 slices shown]
[im 90/268  lung]
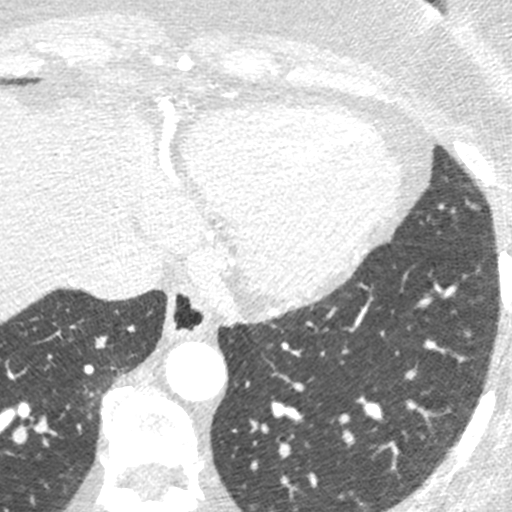
[im 179/268  lung]
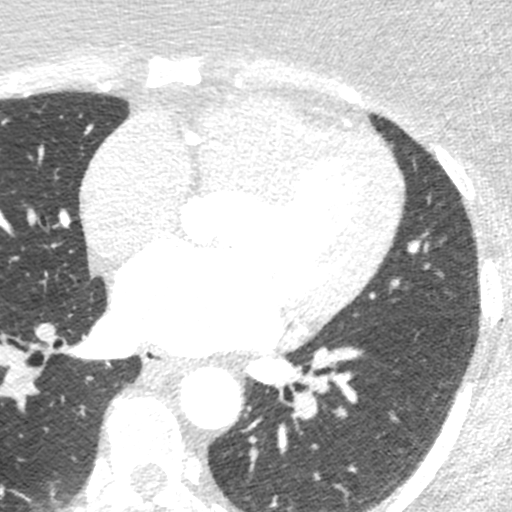

[8 of 20 positions shown; findings below may reference images not displayed]

FINDINGS: Vascular: No incidental vascular findings.

Mediastinum/Nodes: Visualized mediastinum and hilar regions
demonstrate no lymphadenopathy or masses.

Lungs/Pleura: Visualized lungs show no evidence of pulmonary edema,
consolidation, pneumothorax, nodule or pleural fluid.

Upper Abdomen: No acute abnormality.

Musculoskeletal: No chest wall mass or suspicious bone lesions
identified.
IMPRESSION: No significant incidental findings.
FINDINGS: Non-cardiac: See separate report from [REDACTED].

Pulmonary veins drained normally to the left atrium.  Possible PFO.

Calcium Score: 0 Agatston units.

Coronary Arteries: Right dominant with no anomalies

LM: No significant coronary disease.

LAD system: No significant coronary disease.

Circumflex system: No significant coronary disease.

RCA system: No significant coronary disease.
IMPRESSION: 1. Coronary artery calcium score 0 Agatston units, suggesting low
risk for future cardiac events.

2. Difficult images due to motion artifact, unable to get heart rate
below 70 bpm. However, I do not see any significant coronary
disease.

Pak Emha Fielienna

*** End of Addendum ***
EXAM:
OVER-READ INTERPRETATION  CT CHEST

The following report is an over-read performed by radiologist Dr.
over-read does not include interpretation of cardiac or coronary
anatomy or pathology. The coronary CTA interpretation by the
cardiologist is attached.
FINDINGS: Vascular: No incidental vascular findings.

Mediastinum/Nodes: Visualized mediastinum and hilar regions
demonstrate no lymphadenopathy or masses.

Lungs/Pleura: Visualized lungs show no evidence of pulmonary edema,
consolidation, pneumothorax, nodule or pleural fluid.

Upper Abdomen: No acute abnormality.

Musculoskeletal: No chest wall mass or suspicious bone lesions
identified.
IMPRESSION: No significant incidental findings.

## 2020-05-16 ENCOUNTER — Telehealth: Payer: Self-pay | Admitting: Internal Medicine

## 2020-05-16 NOTE — Telephone Encounter (Signed)
Called pt. She has been rescheduled to 1/4 at 8:30am. Patient aware will mail new prep instructions with new pre-op/covid test appt. Message sent to endo making aware

## 2020-05-16 NOTE — Telephone Encounter (Signed)
Pt wants to reschedule her procedure with Dr Abbey Chatters on 12/14 due to her work schedule. Please call [phone] ext [phone] or [phone]

## 2020-05-17 ENCOUNTER — Encounter: Payer: Self-pay | Admitting: *Deleted

## 2020-05-28 ENCOUNTER — Other Ambulatory Visit (HOSPITAL_COMMUNITY): Payer: BC Managed Care – PPO

## 2020-05-29 DIAGNOSIS — E1165 Type 2 diabetes mellitus with hyperglycemia: Secondary | ICD-10-CM | POA: Diagnosis not present

## 2020-05-29 DIAGNOSIS — K829 Disease of gallbladder, unspecified: Secondary | ICD-10-CM | POA: Diagnosis not present

## 2020-05-29 DIAGNOSIS — Z6839 Body mass index (BMI) 39.0-39.9, adult: Secondary | ICD-10-CM | POA: Diagnosis not present

## 2020-06-14 DIAGNOSIS — I10 Essential (primary) hypertension: Secondary | ICD-10-CM | POA: Diagnosis not present

## 2020-06-14 DIAGNOSIS — F321 Major depressive disorder, single episode, moderate: Secondary | ICD-10-CM | POA: Diagnosis not present

## 2020-06-14 DIAGNOSIS — Z6839 Body mass index (BMI) 39.0-39.9, adult: Secondary | ICD-10-CM | POA: Diagnosis not present

## 2020-06-14 DIAGNOSIS — E1165 Type 2 diabetes mellitus with hyperglycemia: Secondary | ICD-10-CM | POA: Diagnosis not present

## 2020-06-14 DIAGNOSIS — E8881 Metabolic syndrome: Secondary | ICD-10-CM | POA: Diagnosis not present

## 2020-06-14 DIAGNOSIS — Z0000 Encounter for general adult medical examination without abnormal findings: Secondary | ICD-10-CM | POA: Diagnosis not present

## 2020-06-14 DIAGNOSIS — K219 Gastro-esophageal reflux disease without esophagitis: Secondary | ICD-10-CM | POA: Diagnosis not present

## 2020-06-15 ENCOUNTER — Other Ambulatory Visit (HOSPITAL_COMMUNITY): Payer: BC Managed Care – PPO

## 2020-06-26 DIAGNOSIS — Z6839 Body mass index (BMI) 39.0-39.9, adult: Secondary | ICD-10-CM | POA: Diagnosis not present

## 2020-06-26 DIAGNOSIS — Z0000 Encounter for general adult medical examination without abnormal findings: Secondary | ICD-10-CM | POA: Diagnosis not present

## 2020-07-04 NOTE — Patient Instructions (Addendum)
Kayla Wilkinson  07/04/2020     @PREFPERIOPPHARMACY @   Your procedure is scheduled on 07/10/2020.  Report to [facility] at 7:00 A.M.  Call this number if you have problems the morning of surgery:  [phone]   Remember:  Do not eat or drink after midnight.   Please follow the diet and prep instructions given to yo by Dr Ave Filter office.     Take these medicines the morning of surgery with A SIP OF WATER : Metoprolol, Prilosec   Please take I/2 of your Lantus the night prior to your procedure. (25 units)   Do not take any of your diabetic medications the am of the procedure.     Do not wear jewelry, make-up or nail polish.  Do not wear lotions, powders, or perfumes, or deodorant.  Do not shave 48 hours prior to surgery.  Men may shave face and neck.  Do not bring valuables to the hospital.  [facility] is not responsible for any belongings or valuables.  Contacts, dentures or bridgework may not be worn into surgery.  Leave your suitcase in the car.  After surgery it may be brought to your room.  For patients admitted to the hospital, discharge time will be determined by your treatment team.  Patients discharged the day of surgery will not be allowed to drive home.   Name and phone number of your driver:   family Special instructions:  n/a  Please read over the following fact sheets that you were given. Care and Recovery After Surgery    Monitored Anesthesia Care Anesthesia is a term that refers to techniques, procedures, and medicines that help a person stay safe and comfortable during a medical procedure. Monitored anesthesia care, or sedation, is one type of anesthesia. Your anesthesia specialist may recommend sedation if you will be having a procedure that does not require you to be unconscious, such as:  Cataract surgery.  A dental procedure.  A biopsy.  A colonoscopy. During the procedure, you may receive a medicine to help you relax (sedative).  There are three levels of sedation:  Mild sedation. At this level, you may feel awake and relaxed. You will be able to follow directions.  Moderate sedation. At this level, you will be sleepy. You may not remember the procedure.  Deep sedation. At this level, you will be asleep. You will not remember the procedure. The more medicine you are given, the deeper your level of sedation will be. Depending on how you respond to the procedure, the anesthesia specialist may change your level of sedation or the type of anesthesia to fit your needs. An anesthesia specialist will monitor you closely during the procedure. Let your health care provider know about:  Any allergies you have.  All medicines you are taking, including vitamins, herbs, eye drops, creams, and over-the-counter medicines.  Any use of steroids (by mouth or as a cream).  Any problems you or family members have had with sedatives and anesthetic medicines.  Any blood disorders you have.  Any surgeries you have had.  Any medical conditions you have, such as sleep apnea.  Whether you are pregnant or may be pregnant.  Any use of cigarettes, alcohol, or street drugs. What are the risks? Generally, this is a safe procedure. However, problems may occur, including:  Getting too much medicine (oversedation).  Nausea.  Allergic reaction to medicines.  Trouble breathing. If this happens, a breathing tube may be used to help with breathing. It  will be removed when you are awake and breathing on your own.  Heart trouble.  Lung trouble. Before the procedure Staying hydrated Follow instructions from your health care provider about hydration, which may include:  Up to 2 hours before the procedure - you may continue to drink clear liquids, such as water, clear fruit juice, black coffee, and plain tea. Eating and drinking restrictions Follow instructions from your health care provider about eating and drinking, which may  include:  8 hours before the procedure - stop eating heavy meals or foods such as meat, fried foods, or fatty foods.  6 hours before the procedure - stop eating light meals or foods, such as toast or cereal.  6 hours before the procedure - stop drinking milk or drinks that contain milk.  2 hours before the procedure - stop drinking clear liquids. Medicines Ask your health care provider about:  Changing or stopping your regular medicines. This is especially important if you are taking diabetes medicines or blood thinners.  Taking medicines such as aspirin and ibuprofen. These medicines can thin your blood. Do not take these medicines before your procedure if your health care provider instructs you not to. Tests and exams  You will have a physical exam.  You may have blood tests done to show: ? How well your kidneys and liver are working. ? How well your blood can clot. General instructions  Plan to have someone take you home from the hospital or clinic.  If you will be going home right after the procedure, plan to have someone with you for 24 hours.  What happens during the procedure?  Your blood pressure, heart rate, breathing, level of pain and overall condition will be monitored.  An IV tube will be inserted into one of your veins.  Your anesthesia specialist will give you medicines as needed to keep you comfortable during the procedure. This may mean changing the level of sedation.  The procedure will be performed. After the procedure  Your blood pressure, heart rate, breathing rate, and blood oxygen level will be monitored until the medicines you were given have worn off.  Do not drive for 24 hours if you received a sedative.  You may: ? Feel sleepy, clumsy, or nauseous. ? Feel forgetful about what happened after the procedure. ? Have a sore throat if you had a breathing tube during the procedure. ? Vomit. This information is not intended to replace advice given to  you by your health care provider. Make sure you discuss any questions you have with your health care provider. Document Revised: 06/05/2017 Document Reviewed: 10/14/2015 Elsevier Patient Education  [place].  Colonoscopy, Adult A colonoscopy is a procedure to look at the entire large intestine. This procedure is done using a long, thin, flexible tube that has a camera on the end. You may have a colonoscopy:  As a part of normal colorectal screening.  If you have certain symptoms, such as: ? A low number of red blood cells in your blood (anemia). ? Diarrhea that does not go away. ? Pain in your abdomen. ? Blood in your stool. A colonoscopy can help screen for and diagnose medical problems, including:  Tumors.  Extra tissue that grows where mucus forms (polyps).  Inflammation.  Areas of bleeding. Tell your health care provider about:  Any allergies you have.  All medicines you are taking, including vitamins, herbs, eye drops, creams, and over-the-counter medicines.  Any problems you or family members have had  with anesthetic medicines.  Any blood disorders you have.  Any surgeries you have had.  Any medical conditions you have.  Any problems you have had with having bowel movements.  Whether you are pregnant or may be pregnant. What are the risks? Generally, this is a safe procedure. However, problems may occur, including:  Bleeding.  Damage to your intestine.  Allergic reactions to medicines given during the procedure.  Infection. This is rare. What happens before the procedure? Eating and drinking restrictions Follow instructions from your health care provider about eating or drinking restrictions, which may include:  A few days before the procedure: ? Follow a low-fiber diet. ? Avoid nuts, seeds, dried fruit, raw fruits, and vegetables.  1-3 days before the procedure: ? Eat only gelatin dessert or ice pops. ? Drink only clear liquids, such as  water, clear juice, clear broth or bouillon, black coffee or tea, or clear soft drinks or sports drinks. ? Avoid liquids that contain red or purple dye.  The day of the procedure: ? Do not eat solid foods. You may continue to drink clear liquids until up to 2 hours before the procedure. ? Do not eat or drink anything starting 2 hours before the procedure, or within the time period that your health care provider recommends. Bowel prep If you were prescribed a bowel prep to take by mouth (orally) to clean out your colon:  Take it as told by your health care provider. Starting the day before your procedure, you will need to drink a large amount of liquid medicine. The liquid will cause you to have many bowel movements of loose stool until your stool becomes almost clear or light green.  If your skin or the opening between the buttocks (anus) gets irritated from diarrhea, you may relieve the irritation using: ? Wipes with medicine in them, such as adult wet wipes with aloe and vitamin E. ? A product to soothe skin, such as petroleum jelly.  If you vomit while drinking the bowel prep: ? Take a break for up to 60 minutes. ? Begin the bowel prep again. ? Call your health care provider if you keep vomiting or you cannot take the bowel prep without vomiting.  To clean out your colon, you may also be given: ? Laxative medicines. These help you have a bowel movement. ? Instructions for enema use. An enema is liquid medicine injected into your rectum. Medicines Ask your health care provider about:  Changing or stopping your regular medicines or supplements. This is especially important if you are taking iron supplements, diabetes medicines, or blood thinners.  Taking medicines such as aspirin and ibuprofen. These medicines can thin your blood. Do not take these medicines unless your health care provider tells you to take them.  Taking over-the-counter medicines, vitamins, herbs, and  supplements. General instructions  Ask your health care provider what steps will be taken to help prevent infection. These may include washing skin with a germ-killing soap.  Plan to have someone take you home from the hospital or clinic. What happens during the procedure?   An IV will be inserted into one of your veins.  You may be given one or more of the following: ? A medicine to help you relax (sedative). ? A medicine to numb the area (local anesthetic). ? A medicine to make you fall asleep (general anesthetic). This is rarely needed.  You will lie on your side with your knees bent.  The tube will: ? Have oil  or gel put on it (be lubricated). ? Be inserted into your anus. ? Be gently eased through all parts of your large intestine.  Air will be sent into your colon to keep it open. This may cause some pressure or cramping.  Images will be taken with the camera and will appear on a screen.  A small tissue sample may be removed to be looked at under a microscope (biopsy). The tissue may be sent to a lab for testing if any signs of problems are found.  If small polyps are found, they may be removed and checked for cancer cells.  When the procedure is finished, the tube will be removed. The procedure may vary among health care providers and hospitals. What happens after the procedure?  Your blood pressure, heart rate, breathing rate, and blood oxygen level will be monitored until you leave the hospital or clinic.  You may have a small amount of blood in your stool.  You may pass gas and have mild cramping or bloating in your abdomen. This is caused by the air that was used to open your colon during the exam.  Do not drive for 24 hours after the procedure.  It is up to you to get the results of your procedure. Ask your health care provider, or the department that is doing the procedure, when your results will be ready. Summary  A colonoscopy is a procedure to look at the  entire large intestine.  Follow instructions from your health care provider about eating and drinking before the procedure.  If you were prescribed an oral bowel prep to clean out your colon, take it as told by your health care provider.  During the colonoscopy, a flexible tube with a camera on its end is inserted into the anus and then passed into the other parts of the large intestine. This information is not intended to replace advice given to you by your health care provider. Make sure you discuss any questions you have with your health care provider. Document Revised: 01/14/2019 Document Reviewed: 01/14/2019 Elsevier Patient Education  [place]

## 2020-07-05 ENCOUNTER — Ambulatory Visit: Payer: BC Managed Care – PPO | Admitting: Gastroenterology

## 2020-07-09 ENCOUNTER — Other Ambulatory Visit: Payer: Self-pay

## 2020-07-09 ENCOUNTER — Other Ambulatory Visit (HOSPITAL_COMMUNITY)
Admission: RE | Admit: 2020-07-09 | Discharge: 2020-07-09 | Disposition: A | Discharge status: Home / Self Care | Payer: No Typology Code available for payment source | Source: Ambulatory Visit | Attending: Internal Medicine | Admitting: Internal Medicine

## 2020-07-09 ENCOUNTER — Encounter (HOSPITAL_COMMUNITY)
Admission: RE | Admit: 2020-07-09 | Discharge: 2020-07-09 | Disposition: A | Discharge status: Home / Self Care | Payer: No Typology Code available for payment source | Source: Ambulatory Visit | Attending: Internal Medicine | Admitting: Internal Medicine

## 2020-07-09 ENCOUNTER — Encounter (HOSPITAL_COMMUNITY): Payer: Self-pay

## 2020-07-09 DIAGNOSIS — Z01818 Encounter for other preprocedural examination: Secondary | ICD-10-CM | POA: Insufficient documentation

## 2020-07-09 DIAGNOSIS — U071 COVID-19: Secondary | ICD-10-CM | POA: Diagnosis not present

## 2020-07-09 LAB — BASIC METABOLIC PANEL
Anion gap: 13 (ref 5–15)
BUN: 13 mg/dL (ref 6–20)
CO2: 21 mmol/L — ABNORMAL LOW (ref 22–32)
Calcium: 9.1 mg/dL (ref 8.9–10.3)
Chloride: 100 mmol/L (ref 98–111)
Creatinine, Ser: 0.41 mg/dL — ABNORMAL LOW (ref 0.44–1.00)
GFR, Estimated: >60 mL/min (ref >60)
Glucose, Bld: 240 mg/dL — ABNORMAL HIGH (ref 70–99)
Potassium: 3.5 mmol/L (ref 3.5–5.1)
Sodium: 134 mmol/L — ABNORMAL LOW (ref 135–145)

## 2020-07-09 LAB — HCG, SERUM, QUALITATIVE: Preg, Serum: NEGATIVE

## 2020-07-09 LAB — SARS CORONAVIRUS 2 (TAT 6-24 HRS): SARS Coronavirus 2: POSITIVE — AB

## 2020-07-10 ENCOUNTER — Ambulatory Visit (HOSPITAL_COMMUNITY): Admission: RE | Admit: 2020-07-10 | Payer: No Typology Code available for payment source | Source: Home / Self Care

## 2020-07-10 ENCOUNTER — Telehealth: Payer: Self-pay | Admitting: Infectious Diseases

## 2020-07-10 ENCOUNTER — Telehealth: Payer: Self-pay

## 2020-07-10 ENCOUNTER — Encounter (HOSPITAL_COMMUNITY): Admission: RE | Payer: Self-pay | Source: Home / Self Care

## 2020-07-10 ENCOUNTER — Other Ambulatory Visit: Payer: Self-pay | Admitting: Family

## 2020-07-10 ENCOUNTER — Encounter (HOSPITAL_COMMUNITY): Payer: Self-pay | Admitting: Certified Registered"

## 2020-07-10 SURGERY — COLONOSCOPY WITH PROPOFOL
Anesthesia: Monitor Anesthesia Care

## 2020-07-10 MED ORDER — NIRMATRELVIR/RITONAVIR (PAXLOVID)TABLET: 3.0000 | ORAL_TABLET | Freq: Two times a day (BID) | ORAL | 0 refills | Status: AC

## 2020-07-10 NOTE — Telephone Encounter (Signed)
Outpatient Oral COVID Treatment Note  I connected with Kayla Wilkinson on 07/10/2020/2:13 PM by telephone and verified that I am speaking with the correct person using two identifiers.  I discussed the limitations, risks, security, and privacy concerns of performing an evaluation and management service by telephone and the availability of in person appointments. I also discussed with the patient that there may be a patient responsible charge related to this service. The patient expressed understanding and agreed to proceed.  Patient location: [facility] Provider location: Home  Diagnosis: COVID-19 infection  Purpose of visit: Discussion of potential use of Molnupiravir or Paxlovid, a new treatment for mild to moderate COVID-19 viral infection in non-hospitalized patients.   Subjective: Patient is a 50 y.o. female who has been diagnosed with COVID 19 viral infection.  Their symptoms began on 07/07/2020 with congestion, fatigue.    Past Medical History:  Diagnosis Date  . Acid reflux   . Diabetes mellitus   . Hypertension     No Known Allergies  Prior to Admission medications   Medication Sig Start Date End Date Taking? Authorizing Provider  aspirin EC 81 MG tablet Take 81 mg by mouth daily.    [provider]  glipiZIDE (GLUCOTROL) 5 MG tablet Take 5 mg by mouth daily.     [provider]  Insulin Glargine (LANTUS SOLOSTAR) 100 UNIT/ML Solostar Pen Inject 50 Units into the skin daily at 10 pm. Patient taking differently: Inject 50-75 Units into the skin daily as needed (High blood sugar). 07/15/17   Cassandria Anger, MD  losartan (COZAAR) 25 MG tablet Take 25 mg by mouth daily.    [provider]  metFORMIN (GLUCOPHAGE) 1000 MG tablet Take 1,000 mg by mouth 2 (two) times daily with a meal.    [provider]  metoprolol tartrate (LOPRESSOR) 25 MG tablet Take 25 mg by mouth 2 (two) times daily.  03/01/19   [provider]  omeprazole  (PRILOSEC) 40 MG capsule Take 40 mg by mouth 2 (two) times daily before a meal.  05/20/19   [provider]  phentermine 37.5 MG capsule Take 37.5 mg by mouth every morning. 06/23/20   [provider]  rosuvastatin (CRESTOR) 10 MG tablet Take 10 mg by mouth daily.    [provider]  STEGLATRO 5 MG TABS Take 5 mg by mouth every morning.  05/19/18   [provider]  valACYclovir (VALTREX) 1000 MG tablet Take 1 tablet (1,000 mg total) by mouth 2 (two) times daily. X 10 days, then once daily thereafter Patient taking differently: Take 1,000 mg by mouth 2 (two) times daily. 03/24/16   Kayla Wilkinson, CNM   Objective: Patient appears/sounds well with mild congestion.  They are in no apparent distress.  Breathing is non labored.  Mood and behavior are normal.  Laboratory Data:  Recent Results (from the past 2160 hour(s))  SARS CORONAVIRUS 2 (TAT 6-24 HRS) Nasopharyngeal Nasopharyngeal Swab     Status: Abnormal   Collection Time: 07/09/20  9:11 AM   Specimen: Nasopharyngeal Swab  Result Value Ref Range   SARS Coronavirus 2 POSITIVE (A) NEGATIVE    Comment: (NOTE) SARS-CoV-2 target nucleic acids are DETECTED.  The SARS-CoV-2 RNA is generally detectable in upper and lower respiratory specimens during the acute phase of infection. Positive results are indicative of the presence of SARS-CoV-2 RNA. Clinical correlation with patient history and other diagnostic information is  necessary to determine patient infection status. Positive results do not  rule out bacterial infection or co-infection with other viruses.  The expected result is Negative.  Fact Sheet for Patients: SugarRoll.be  Fact Sheet for Healthcare Providers: https://www.woods-mathews.com/  This test is not yet approved or cleared by the [place] FDA and  has been authorized for detection and/or diagnosis of SARS-CoV-2 by FDA under an Emergency Use  Authorization (EUA). This EUA will remain  in effect (meaning this test can be used) for the duration of the COVID-19 declaration under Section 564(b)(1) of the Act, 21 U. S.C. section 360bbb-3(b)(1), unless the authorization is terminated or revoked sooner.   Performed at [facility], [place] [address], [place], [place] [address]   Basic metabolic panel     Status: Abnormal   Collection Time: 07/09/20  9:11 AM  Result Value Ref Range   Sodium 134 (L) 135 - 145 mmol/L   Potassium 3.5 3.5 - 5.1 mmol/L   Chloride 100 98 - 111 mmol/L   CO2 21 (L) 22 - 32 mmol/L   Glucose, Bld 240 (H) 70 - 99 mg/dL    Comment: Glucose reference range applies only to samples taken after fasting for at least 8 hours.   BUN 13 6 - 20 mg/dL   Creatinine, Ser 0.41 (L) 0.44 - 1.00 mg/dL   Calcium 9.1 8.9 - 10.3 mg/dL   GFR, Estimated >60 >60 mL/min    Comment: (NOTE) Calculated using the CKD-EPI Creatinine Equation (2021)    Anion gap 13 5 - 15    Comment: Performed at [facility], [address] [place], [place] [address]  hCG, serum, qualitative (Not at [facility])     Status: None   Collection Time: 07/09/20  9:11 AM  Result Value Ref Range   Preg, Serum NEGATIVE NEGATIVE    Comment:        THE SENSITIVITY OF THIS METHODOLOGY IS >10 mIU/mL. Performed at [facility], [address] [place], [place] [address]      Assessment: 50 y.o. female with mild/moderate COVID 19 viral infection diagnosed on 07/09/2020 at high risk for progression to severe COVID 19.  Plan:  This patient is a 50 y.o. female that meets the following criteria for Emergency Use Authorization of: Paxlovid 1. Age >12 yr AND > 40 kg 2. SARS-COV-2 positive test 3. Symptom onset < 5 days 4. Mild-to-moderate COVID disease with high risk for severe progression to hospitalization or death  I have spoken and communicated the following to the patient or parent/caregiver regarding: 1. Paxlovid is an unapproved drug that is authorized for  use under an Emergency Use Authorization.  2. There are no adequate, approved, available products for the treatment of COVID-19 in adults who have mild-to-moderate COVID-19 and are at high risk for progressing to severe COVID-19, including hospitalization or death. 3. Other therapeutics are currently authorized. For additional information on all products authorized for treatment or prevention of COVID-19, please see [email or web address].pl.  4. There are benefits and risks of taking this treatment as outlined in the "Fact Sheet for Patients and Caregivers."  5. "Fact Sheet for Patients and Caregivers" was reviewed with patient. A hard copy will be provided to patient from pharmacy prior to the patient receiving treatment. 6. Patients should continue to self-isolate and use infection control measures (e.g., wear mask, isolate, social distance, avoid sharing personal items, clean and disinfect "high touch" surfaces, and frequent handwashing) according to CDC guidelines.  7. The patient or parent/caregiver has the option to accept or refuse treatment. 8. Patient  medication history was reviewed for potential drug interactions:Interaction with home meds: Instructed to hold Rosuvastatin while taking Paxlovid 9. Patient's creatinine clearance was calculated to be 134, and they were therefore prescribed Normal dose (CrCl>60) - nirmatrelvir 180m tab (2 tablet) by mouth twice daily AND ritonavir 1024mtab (1 tablet) by mouth twice daily   After reviewing above information with the patient, the patient agrees to receive Paxlovid.  Follow up instructions:    . Take prescription BID x 5 days as directed . Reach out to pharmacist for counseling on medication if desired . For concerns regarding further COVID symptoms please follow up with your PCP or urgent care . For urgent or life-threatening issues, seek care  at your local emergency department  The patient was provided an opportunity to ask questions, and all were answered. The patient agreed with the plan and demonstrated an understanding of the instructions.   Script sent to [facility] opted to pick up RX.  The patient was advised to call their PCP or seek an in-person evaluation if the symptoms worsen or if the condition fails to improve as anticipated.   I provided 13 minutes of non face-to-face telephone visit time during this encounter, and > 50% was spent counseling as documented under my assessment & plan.  Kayla DubonnetNP 07/10/2020 /2:13 PM  You are being prescribed PAXLOVID for COVID-19 infection. [facility]. Please pick up and start taking your medication as soon as possible.   ADMINISTRATION INSTRUCTIONS: 2. Take with or without food. Swallow the tablets whole. Don't chew, crush, or break the medications because it might not work as well  3. For each dose of the medication, you should be taking THREE tablets total at one time: TWO pink oval shaped tablets and ONE white oval shaped tablet.  a. This combination of THREE tablets is taken twice a day for 5 days.  Creat clearance>60  2.  For each dose of the medication, you should be taking TWO tablets total at one time: ONE pink oval shaped tablets and ONE white oval shaped tablet.  a. This combination of TWO tablets is taken twice a day for 5 days.  Creat clearance<60  4. Finish your full five-day course of Paxlovid even if you feel better before you're done. Stopping this medication too early can make it less effective to prevent severe illness related to [place]   5. Paxlovid is prescribed for YOU ONLY. Don't share it with others, even if they have similar symptoms as you. This medication might not be right for everyone.  6. Make sure to take steps to protect yourself and others while  you're taking this medication in order to get well soon and to prevent others from getting sick with COVID-19.  7. Paxlovid (nirmatrelvir / ritonavir) can cause hormonal birth control medications to not work well. If you or your partner is currently taking hormonal birth control, use condoms or other birth control methods to prevent unintended pregnancies.  COMMON SIDE EFFECTS: 1. Altered or bad taste in your mouth  2. Diarrhea  3. High blood pressure (1% of people) 4. Muscle aches (1% of people)     If your COVID-19 symptoms get worse, get medical help right away. Call 911 if you experience symptoms such as worsening cough, trouble breathing, chest pain that doesn't go away, confusion, a hard time staying awake, and pale or blue-colored skin. This medication won't prevent  all COVID-19 cases from getting worse.     FACT SHEET FOR PATIENTS, PARENTS, AND CAREGIVERS EMERGENCY USE AUTHORIZATION (EUA) OF PAXLOVID FOR CORONAVIRUS DISEASE 2019 (COVID-19)  You are being given this Fact Sheet because your healthcare provider believes it is necessary to provide you with PAXLOVID for the treatment of mild-to-moderate coronavirus disease (COVID-19) caused by the SARS-CoV-2 virus. This Fact Sheet contains information to help you understand the risks and benefits of taking the PAXLOVID you have received or may receive.   The U.S. Food and Drug Administration (FDA) has issued an Emergency Use Authorization (EUA) to make PAXLOVID available during the COVID-19 pandemic (for more details about an EUA please see "What is an Emergency Use Authorization?" at the end of this document). PAXLOVID is not an FDA-approved medicine in the [place]. Read this Fact Sheet for information about PAXLOVID. Talk to your healthcare provider about your options or if you have any questions. It is your choice to take PAXLOVID.   What is COVID-19? COVID-19 is caused by a virus called a coronavirus. You can get COVID-19  through close contact with another person who has the virus.  COVID-19 illnesses have ranged from very mild-to-severe, including illness resulting in death. While information so far suggests that most COVID-19 illness is mild, serious illness can happen and may cause some of your other medical conditions to become worse. Older people and people of all ages with severe, long lasting (chronic) medical conditions like heart disease, lung disease, and diabetes, for example seem to be at higher risk of being hospitalized for COVID-19.   What is PAXLOVID? PAXLOVID is an investigational medicine used to treat mild-to-moderate COVID-19 in adults and children [55 years of age and older weighing at least 79 pounds (74 kg)] with positive results of direct SARS-CoV-2 viral testing, and who are at high risk for progression to severe COVID-19, including hospitalization or death. PAXLOVID is investigational because it is still being studied. There is limited information about the safety and effectiveness of using PAXLOVID to treat people with mild-to-moderate COVID-19.  The FDA has authorized the emergency use of PAXLOVID for the treatment of mild-tomoderate COVID-19 in adults and children [34 years of age and older weighing at least 43 pounds (89 kg)] with a positive test for the virus that causes COVID-19, and who are  at high risk for progression to severe COVID-19, including hospitalization or death, under an EUA.   What should I tell my healthcare provider before I take PAXLOVID? Tell your healthcare provider if you: . Have any allergies . Have liver or kidney disease  . Are pregnant or plan to become pregnant . Are breastfeeding a child . Have any serious illnesses  Tell your healthcare provider about all the medicines you take, including prescription and over-the-counter medicines, vitamins, and herbal supplements.   Some medicines may interact with PAXLOVID and may cause serious side effects.  Keep a  list of your medicines to show your healthcare provider and pharmacist when you get a new medicine.  You can ask your healthcare provider or pharmacist for a list of medicines that interact with PAXLOVID. Do not start taking a new medicine without telling your healthcare provider. Your healthcare provider can tell you if it is safe to take PAXLOVID with other medicines.   Tell your healthcare provider if you are taking combined hormonal contraceptive. PAXLOVID may affect how your birth control pills work. Females who are able to become pregnant should use another effective alternative form of  contraception or an additional barrier method of contraception. Talk to your healthcare provider if you have any questions about contraceptive methods that might be right for you.    How do I take PAXLOVID? Marland Kitchen PAXLOVID consists of 2 medicines: nirmatrelvir and ritonavir.  . Take 2 pink tablets of nirmatrelvir with 1 white tablet of ritonavir by mouth   . 2 times each day (in the morning and in the evening) for 5 days. For each dose, take all 3 tablets at the same time.  . If you have kidney disease, talk to your healthcare provider. You may need a different dose.  Ricka Burdock the tablets whole. Do not chew, break, or crush the tablets.  . Take PAXLOVID with or without food.   . Do not stop taking PAXLOVID without talking to your healthcare provider, even if you feel better.  . If you miss a dose of PAXLOVID within 8 hours of the time it is usually taken, take it as soon as you remember. If you miss a dose by more than 8 hours, skip the missed dose and take the next dose at your regular time.   . Do not take 2 doses of PAXLOVID at the same time.   . If you take too much PAXLOVID, call your healthcare provider or go to the nearest hospital emergency room right away.  . If you are taking a ritonavir- or cobicistat-containing medicine to treat hepatitis C or Human Immunodeficiency Virus (HIV), you should  continue to take your medicine as prescribed by your healthcare provider.   . Talk to your healthcare provider if you do not feel better or if you feel worse after 5 days.   Who should generally not take PAXLOVID? Do not take PAXLOVID if: . You are allergic to nirmatrelvir, ritonavir, or any of the ingredients in PAXLOVID.  Marland Kitchen You are taking any of the following medicines: o Alfuzosin o Pethidine, piroxicam, propoxyphene o Ranolazine o Amiodarone, dronedarone, flecainide, propafenone, quinidine o Colchicine o Lurasidone, pimozide, clozapine o Dihydroergotamine, ergotamine, methylergonovine o Lovastatin, simvastatin o Sildenafil (Revatio) for pulmonary arterial hypertension (PAH) o Triazolam, oral midazolam o Apalutamide o Carbamazepine, phenobarbital, phenytoin o Rifampin o St. John's Wort (hypericum perforatum)  Taking PAXLOVID with these medicines may cause serious or life-threatening side effects or affect how PAXLOVID works.  These are not the only medicines that may cause serious side effects if taken with PAXLOVID. PAXLOVID may increase or decrease the levels of multiple other medicines. It is very important to tell your healthcare provider about all of the medicines you are taking because additional laboratory tests or changes in the dose of your other medicines may be necessary while you are taking PAXLOVID. Your healthcare provider may also tell you about specific symptoms to watch out for that may indicate that you need to stop or decrease the dose of some of your other medicines.   What are the important possible side effects of PAXLOVID? Possible side effects of PAXLOVID are: . Liver Problems. Tell your healthcare provider right away if you have any of  these signs and symptoms of liver problems: loss of appetite, yellowing of your  skin and the whites of eyes (jaundice), dark-colored urine, pale colored stools  and itchy skin, stomach area (abdominal) pain. Marland Kitchen Resistance  to HIV Medicines. If you have untreated HIV infection, PAXLOVID may lead to some HIV medicines not working as well in the future.  . Other possible side effects include:  o altered sense of taste  o diarrhea  o high blood pressure  o muscle aches  These are not all the possible side effects of PAXLOVID. Not many people have taken PAXLOVID. Serious and unexpected side effects may happen. PAXLOVID is still being studied, so it is possible that all of the risks are not known at this time.   What other treatment choices are there? Like PAXLOVID, FDA may allow for the emergency use of other medicines to treat people with COVID-19.  Go to https://price.info/ for information on the emergency use of other medicines that are authorized by FDA to treat people with COVID-19. Your healthcare provider may talk with you about clinical trials for which you may be eligible.   It is your choice to be treated or not to be treated with PAXLOVID. Should you decide not to receive it or for your child not to receive it, it will not change your standard medical care.  What if I am pregnant or breastfeeding? There is no experience treating pregnant women or breastfeeding mothers with PAXLOVID. For a mother and unborn baby, the benefit of taking PAXLOVID may be greater than the risk from the treatment. If you are pregnant, discuss your options and specific situation with your healthcare provider.  . It is recommended that you use effective barrier contraception or do not have sexual  activity while taking PAXLOVID.  . If you are breastfeeding, discuss your options and specific situation with your healthcare provider.   How do I report side effects with PAXLOVID?  Contact your healthcare provider if you have any side effects that bother you or do not go away.  Report side effects to FDA MedWatch at  SmoothHits.hu or call 1-800-FDA1088 or you can report side effects to [employer]. at the contact information provided below.  Website Fax number Telephone number www.pfizersafetyreporting.com [phone]   How should I store [place]? Store PAXLOVID tablets at room temperature, between 68?F to 77?F (20?C to 25?C). How can I learn more about COVID-19?  Marland Kitchen Ask your healthcare provider. . Visit [email or web address] Minette Brine your local or state public health department.   What is an Emergency Use Authorization (EUA)? The [place] FDA has made PAXLOVID available under an emergency access mechanism called an Emergency Use Authorization (EUA). The EUA is supported by a Education officer, museum and Human Service (HHS) declaration that circumstances exist to justify the emergency use of drugs and biological products during the COVID-19 pandemic.   PAXLOVID for the treatment of mild-to-moderate COVID-19 in adults and children [14 years of age and older weighing at least 9 pounds (61 kg)] with positive results of direct SARS-CoV-2 viral testing, and who are at high risk for progression to severe  COVID-19, including hospitalization or death, has not undergone the same type of review as an FDA-approved product. In issuing an EUA under the XIPJA-25 public health emergency, the FDA has determined, among other things, that based on the total amount of scientific evidence available including data from adequate and well-controlled clinical trials, if available, it is reasonable to believe that the product may be effective for diagnosing, treating, or preventing COVID-19, or a serious or life-threatening disease or condition caused by COVID-19; that the known and potential benefits of the product, when used to diagnose, treat, or prevent such disease or  condition, outweigh the known and potential risks of such product; and that there are no adequate, approved, and available  alternatives.   All of these criteria must be met to allow for the  product to be used in the treatment of patients during the COVID-19 pandemic. The EUA for PAXLOVID is in effect for the duration of the COVID-19 declaration justifying emergency use of this product, unless terminated or revoked (after which the products may no longer be used under the EUA).  Additional Information: For general questions, visit the website or call the telephone number provided below.  Website Telephone number  www.COVID19oralRx.com  [phone] (1-877-C19-PACK)  www.pfizermedinfo.com or call [phone] for more  information.  Revised: 22 December 202

## 2020-07-10 NOTE — Addendum Note (Signed)
Addended by: Alver Sorrow on: 07/10/2020 02:31 PM   Modules accepted: Orders

## 2020-07-10 NOTE — Telephone Encounter (Signed)
Patient was prescribed oral covid treatment Paxlovid and treatment note was reviewed. Medication has been received by [facility] and reviewed for appropriateness.  Drug Interactions or Dosage Adjustments Noted: Patient instructed to hold rosuvastatin dose while taking Paxlovid.  Delivery Method: Pick-up  Patient contacted for counseling on 07/10/20 and verbalized understanding.   Delivery or Pick-Up Date: 07/10/20   Marney Doctor 07/10/2020, 3:14 PM [facility] Out[facility] Pharmacist Phone# [phone]

## 2020-07-10 NOTE — Telephone Encounter (Signed)
Called to discuss with patient about COVID-19 symptoms and the use of one of the available treatments for those with mild to moderate Covid symptoms and at a high risk of hospitalization.  Pt appears to qualify for outpatient treatment due to co-morbid conditions and/or a member of an at-risk group in accordance with the FDA Emergency Use Authorization.   Symptom onset: 07/07/20  Vaccinated: Moderna 02/2020 Qualifiers: T2DM   Rexene Alberts, NP

## 2020-07-10 NOTE — Progress Notes (Signed)
Pt. COVID positive.  Unable to get in touch with pt due to voicemail box full. Pt. Arrived to [facility] unmasked & was told that procedure was cancelled due to COVID positive & was given mask to wear. Instructed to call Dr. Queen Blossom office & reschedule for a later date.

## 2020-07-10 NOTE — Telephone Encounter (Signed)
Patient was prescribed oral covid treatment Paxlovid and treatment note was reviewed. Medication has been received by [facility] and reviewed for appropriateness.  Drug Interactions or Dosage Adjustments Noted: Patient instructed to hold rosuvastatin dose while taking Paxlovid.  Delivery Method: Pick-up  Patient contacted for counseling on 07/10/20 and verbalized understanding.   Delivery or Pick-Up Date: 07/10/20   Izola Teague Mann 07/10/2020, 3:14 PM [facility] Pharmacist Phone# [phone] 

## 2020-09-07 ENCOUNTER — Telehealth: Payer: Self-pay

## 2020-09-07 NOTE — Telephone Encounter (Signed)
Absolutely okay to reschedule without OV. thanks

## 2020-09-07 NOTE — Telephone Encounter (Signed)
Pt called, she was scheduled for a tcs/egd in January- her procedure got cancelled because she was positive for Covid. Pt stated she was fine now and said the hospital told her she could just call to reschedule her procedures. Pt said she is not on any new medications and is not having any problems from covid and would like to schedule her procedures now.   Dr.Carver, pt told me that she works 2 jobs and has a hard time getting off work and if possible would like to just reschedule her procedure and not have to come in the office for another office visit.  Is it ok to reschedule her?   [phone]- pts phone number. I advised the pt that it would be next week before she heard back from [place] and she was ok with that.

## 2020-09-10 NOTE — Telephone Encounter (Signed)
Pre-op/covid test 10/12/20 at 2:30pm. Appt letter mailed with procedure instructions.

## 2020-09-10 NOTE — Telephone Encounter (Addendum)
Called pt, TCS/EGD ASA 3 scheduled with Dr. Abbey Chatters for 10/16/20--AM (diabetic). She's aware she will need covid test at pre-op d/t procedure date is >90 days since she tested + 07/09/20. She already has everything for Miralax prep. Orders entered.

## 2020-09-10 NOTE — Telephone Encounter (Signed)
Mindy or Tretha Sciara,  Please reschedule tcs/egd. Ok per [employer]. see below.

## 2020-09-13 ENCOUNTER — Other Ambulatory Visit: Payer: Self-pay

## 2020-09-13 NOTE — Telephone Encounter (Addendum)
[facility], spoke to [place], no PA needed for TCS/EGD. Ref# [id number]

## 2020-10-09 NOTE — Patient Instructions (Signed)
Kayla Wilkinson  10/09/2020     @PREFPERIOPPHARMACY @   Your procedure is scheduled on 10/16/20.  Report to [facility] at 8:15 A.M.  Call this number if you have problems the morning of surgery:  [phone]   Remember:  Do not eat or drink after midnight.  FOLLOW ALL INSTRUCTIONS GIVEN TO YOU BY DR. Ave Filter OFFICE    Take these medicines the morning of surgery with A SIP OF WATER Losartan, metoprolol, prilosec  Take 1/2 of nightly dose of insulin and NO diabetic medication the am of procedure    Do not wear jewelry, make-up or nail polish.  Do not wear lotions, powders, or perfumes, or deodorant.  Do not shave 48 hours prior to surgery.  Men may shave face and neck.  Do not bring valuables to the hospital.  [facility] is not responsible for any belongings or valuables.  Contacts, dentures or bridgework may not be worn into surgery.  Leave your suitcase in the car.  After surgery it may be brought to your room.  For patients admitted to the hospital, discharge time will be determined by your treatment team.  Patients discharged the day of surgery will not be allowed to drive home.    Please read over the following fact sheets that you were given. Anesthesia Post-op Instructions      PATIENT INSTRUCTIONS POST-ANESTHESIA  IMMEDIATELY FOLLOWING SURGERY:  Do not drive or operate machinery for the first twenty four hours after surgery.  Do not make any important decisions for twenty four hours after surgery or while taking narcotic pain medications or sedatives.  If you develop intractable nausea and vomiting or a severe headache please notify your doctor immediately.  FOLLOW-UP:  Please make an appointment with your surgeon as instructed. You do not need to follow up with anesthesia unless specifically instructed to do so.  WOUND CARE INSTRUCTIONS (if applicable):  Keep a dry clean dressing on the anesthesia/puncture wound site if there is drainage.  Once the wound  has quit draining you may leave it open to air.  Generally you should leave the bandage intact for twenty four hours unless there is drainage.  If the epidural site drains for more than 36-48 hours please call the anesthesia department.  QUESTIONS?:  Please feel free to call your physician or the hospital operator if you have any questions, and they will be happy to assist you.      Upper Endoscopy, Adult Upper endoscopy is a procedure to look inside the upper GI (gastrointestinal) tract. The upper GI tract is made up of:  The part of the body that moves food from your mouth to your stomach (esophagus).  The stomach.  The first part of your small intestine (duodenum). This procedure is also called esophagogastroduodenoscopy (EGD) or gastroscopy. In this procedure, your health care provider passes a thin, flexible tube (endoscope) through your mouth and down your esophagus into your stomach. A small camera is attached to the end of the tube. Images from the camera appear on a monitor in the exam room. During this procedure, your health care provider may also remove a small piece of tissue to be sent to a lab and examined under a microscope (biopsy). Your health care provider may do an upper endoscopy to diagnose cancers of the upper GI tract. You may also have this procedure to find the cause of other conditions, such as:  Stomach pain.  Heartburn.  Pain or problems when swallowing.  Nausea  and vomiting.  Stomach bleeding.  Stomach ulcers. Tell a health care provider about:  Any allergies you have.  All medicines you are taking, including vitamins, herbs, eye drops, creams, and over-the-counter medicines.  Any problems you or family members have had with anesthetic medicines.  Any blood disorders you have.  Any surgeries you have had.  Any medical conditions you have.  Whether you are pregnant or may be pregnant. What are the risks? Generally, this is a safe procedure.  However, problems may occur, including:  Infection.  Bleeding.  Allergic reactions to medicines.  A tear or hole (perforation) in the esophagus, stomach, or duodenum. What happens before the procedure? Staying hydrated Follow instructions from your health care provider about hydration, which may include:  Up to 2 hours before the procedure - you may continue to drink clear liquids, such as water, clear fruit juice, black coffee, and plain tea.   Eating and drinking restrictions Follow instructions from your health care provider about eating and drinking, which may include:  8 hours before the procedure - stop eating heavy meals or foods, such as meat, fried foods, or fatty foods.  6 hours before the procedure - stop eating light meals or foods, such as toast or cereal.  6 hours before the procedure - stop drinking milk or drinks that contain milk.  2 hours before the procedure - stop drinking clear liquids. Medicines Ask your health care provider about:  Changing or stopping your regular medicines. This is especially important if you are taking diabetes medicines or blood thinners.  Taking medicines such as aspirin and ibuprofen. These medicines can thin your blood. Do not take these medicines unless your health care provider tells you to take them.  Taking over-the-counter medicines, vitamins, herbs, and supplements. General instructions  Plan to have someone take you home from the hospital or clinic.  If you will be going home right after the procedure, plan to have someone with you for 24 hours.  Ask your health care provider what steps will be taken to help prevent infection. What happens during the procedure?  An IV will be inserted into one of your veins.  You may be given one or more of the following: ? A medicine to help you relax (sedative). ? A medicine to numb the throat (local anesthetic).  You will lie on your left side on an exam table.  Your health care  provider will pass the endoscope through your mouth and down your esophagus.  Your health care provider will use the scope to check the inside of your esophagus, stomach, and duodenum. Biopsies may be taken.  The endoscope will be removed. The procedure may vary among health care providers and hospitals.   What happens after the procedure?  Your blood pressure, heart rate, breathing rate, and blood oxygen level will be monitored until you leave the hospital or clinic.  Do not drive for 24 hours if you were given a sedative during your procedure.  When your throat is no longer numb, you may be given some fluids to drink.  It is up to you to get the results of your procedure. Ask your health care provider, or the department that is doing the procedure, when your results will be ready. Summary  Upper endoscopy is a procedure to look inside the upper GI tract.  During the procedure, an IV will be inserted into one of your veins. You may be given a medicine to help you relax.  A  medicine will be used to numb your throat.  The endoscope will be passed through your mouth and down your esophagus. This information is not intended to replace advice given to you by your health care provider. Make sure you discuss any questions you have with your health care provider. Document Revised: 12/16/2017 Document Reviewed: 11/23/2017 Elsevier Patient Education  2021 [employer]. Colonoscopy, Adult A colonoscopy is a procedure to look at the entire large intestine. This procedure is done using a long, thin, flexible tube that has a camera on the end. You may have a colonoscopy:  As a part of normal colorectal screening.  If you have certain symptoms, such as: ? A low number of red blood cells in your blood (anemia). ? Diarrhea that does not go away. ? Pain in your abdomen. ? Blood in your stool. A colonoscopy can help screen for and diagnose medical problems, including:  Tumors.  Extra tissue  that grows where mucus forms (polyps).  Inflammation.  Areas of bleeding. Tell your health care provider about:  Any allergies you have.  All medicines you are taking, including vitamins, herbs, eye drops, creams, and over-the-counter medicines.  Any problems you or family members have had with anesthetic medicines.  Any blood disorders you have.  Any surgeries you have had.  Any medical conditions you have.  Any problems you have had with having bowel movements.  Whether you are pregnant or may be pregnant. What are the risks? Generally, this is a safe procedure. However, problems may occur, including:  Bleeding.  Damage to your intestine.  Allergic reactions to medicines given during the procedure.  Infection. This is rare. What happens before the procedure? Eating and drinking restrictions Follow instructions from your health care provider about eating or drinking restrictions, which may include:  A few days before the procedure: ? Follow a low-fiber diet. ? Avoid nuts, seeds, dried fruit, raw fruits, and vegetables.  1-3 days before the procedure: ? Eat only gelatin dessert or ice pops. ? Drink only clear liquids, such as water, clear juice, clear broth or bouillon, black coffee or tea, or clear soft drinks or sports drinks. ? Avoid liquids that contain red or purple dye.  The day of the procedure: ? Do not eat solid foods. You may continue to drink clear liquids until up to 2 hours before the procedure. ? Do not eat or drink anything starting 2 hours before the procedure, or within the time period that your health care provider recommends. Bowel prep If you were prescribed a bowel prep to take by mouth (orally) to clean out your colon:  Take it as told by your health care provider. Starting the day before your procedure, you will need to drink a large amount of liquid medicine. The liquid will cause you to have many bowel movements of loose stool until your  stool becomes almost clear or light green.  If your skin or the opening between the buttocks (anus) gets irritated from diarrhea, you may relieve the irritation using: ? Wipes with medicine in them, such as adult wet wipes with aloe and vitamin E. ? A product to soothe skin, such as petroleum jelly.  If you vomit while drinking the bowel prep: ? Take a break for up to 60 minutes. ? Begin the bowel prep again. ? Call your health care provider if you keep vomiting or you cannot take the bowel prep without vomiting.  To clean out your colon, you may also be given: ?  Laxative medicines. These help you have a bowel movement. ? Instructions for enema use. An enema is liquid medicine injected into your rectum. Medicines Ask your health care provider about:  Changing or stopping your regular medicines or supplements. This is especially important if you are taking iron supplements, diabetes medicines, or blood thinners.  Taking medicines such as aspirin and ibuprofen. These medicines can thin your blood. Do not take these medicines unless your health care provider tells you to take them.  Taking over-the-counter medicines, vitamins, herbs, and supplements. General instructions  Ask your health care provider what steps will be taken to help prevent infection. These may include washing skin with a germ-killing soap.  Plan to have someone take you home from the hospital or clinic. What happens during the procedure?  An IV will be inserted into one of your veins.  You may be given one or more of the following: ? A medicine to help you relax (sedative). ? A medicine to numb the area (local anesthetic). ? A medicine to make you fall asleep (general anesthetic). This is rarely needed.  You will lie on your side with your knees bent.  The tube will: ? Have oil or gel put on it (be lubricated). ? Be inserted into your anus. ? Be gently eased through all parts of your large intestine.  Air  will be sent into your colon to keep it open. This may cause some pressure or cramping.  Images will be taken with the camera and will appear on a screen.  A small tissue sample may be removed to be looked at under a microscope (biopsy). The tissue may be sent to a lab for testing if any signs of problems are found.  If small polyps are found, they may be removed and checked for cancer cells.  When the procedure is finished, the tube will be removed. The procedure may vary among health care providers and hospitals.   What happens after the procedure?  Your blood pressure, heart rate, breathing rate, and blood oxygen level will be monitored until you leave the hospital or clinic.  You may have a small amount of blood in your stool.  You may pass gas and have mild cramping or bloating in your abdomen. This is caused by the air that was used to open your colon during the exam.  Do not drive for 24 hours after the procedure.  It is up to you to get the results of your procedure. Ask your health care provider, or the department that is doing the procedure, when your results will be ready. Summary  A colonoscopy is a procedure to look at the entire large intestine.  Follow instructions from your health care provider about eating and drinking before the procedure.  If you were prescribed an oral bowel prep to clean out your colon, take it as told by your health care provider.  During the colonoscopy, a flexible tube with a camera on its end is inserted into the anus and then passed into the other parts of the large intestine. This information is not intended to replace advice given to you by your health care provider. Make sure you discuss any questions you have with your health care provider. Document Revised: 01/14/2019 Document Reviewed: 01/14/2019 Elsevier Patient Education  [place].

## 2020-10-12 ENCOUNTER — Encounter (HOSPITAL_COMMUNITY)
Admission: RE | Admit: 2020-10-12 | Discharge: 2020-10-12 | Disposition: A | Discharge status: Home / Self Care | Payer: No Typology Code available for payment source | Source: Ambulatory Visit | Attending: Internal Medicine | Admitting: Internal Medicine

## 2020-10-12 ENCOUNTER — Other Ambulatory Visit: Payer: Self-pay

## 2020-10-12 ENCOUNTER — Other Ambulatory Visit (HOSPITAL_COMMUNITY)
Admission: RE | Admit: 2020-10-12 | Discharge: 2020-10-12 | Disposition: A | Discharge status: Home / Self Care | Payer: No Typology Code available for payment source | Source: Ambulatory Visit | Attending: Internal Medicine | Admitting: Internal Medicine

## 2020-10-12 DIAGNOSIS — Z01812 Encounter for preprocedural laboratory examination: Secondary | ICD-10-CM | POA: Diagnosis not present

## 2020-10-12 DIAGNOSIS — Z20822 Contact with and (suspected) exposure to covid-19: Secondary | ICD-10-CM | POA: Diagnosis not present

## 2020-10-12 LAB — BASIC METABOLIC PANEL
Anion gap: 13 (ref 5–15)
BUN: 16 mg/dL (ref 6–20)
CO2: 22 mmol/L (ref 22–32)
Calcium: 9.4 mg/dL (ref 8.9–10.3)
Chloride: 103 mmol/L (ref 98–111)
Creatinine, Ser: 0.41 mg/dL — ABNORMAL LOW (ref 0.44–1.00)
GFR, Estimated: >60 mL/min (ref >60)
Glucose, Bld: 159 mg/dL — ABNORMAL HIGH (ref 70–99)
Potassium: 3.9 mmol/L (ref 3.5–5.1)
Sodium: 138 mmol/L (ref 135–145)

## 2020-10-13 LAB — SARS CORONAVIRUS 2 (TAT 6-24 HRS): SARS Coronavirus 2: NEGATIVE

## 2020-10-16 ENCOUNTER — Ambulatory Visit (HOSPITAL_COMMUNITY): Payer: No Typology Code available for payment source | Admitting: Anesthesiology

## 2020-10-16 ENCOUNTER — Encounter (HOSPITAL_COMMUNITY)
Admission: RE | Disposition: A | Discharge status: Home / Self Care | Payer: Self-pay | Source: Ambulatory Visit | Attending: Internal Medicine

## 2020-10-16 ENCOUNTER — Ambulatory Visit (HOSPITAL_COMMUNITY)
Admission: RE | Admit: 2020-10-16 | Discharge: 2020-10-16 | Disposition: A | Discharge status: Home / Self Care | Payer: No Typology Code available for payment source | Source: Ambulatory Visit | Attending: Internal Medicine | Admitting: Internal Medicine

## 2020-10-16 ENCOUNTER — Encounter (HOSPITAL_COMMUNITY): Payer: Self-pay

## 2020-10-16 DIAGNOSIS — Z87891 Personal history of nicotine dependence: Secondary | ICD-10-CM | POA: Insufficient documentation

## 2020-10-16 DIAGNOSIS — K5730 Diverticulosis of large intestine without perforation or abscess without bleeding: Secondary | ICD-10-CM | POA: Insufficient documentation

## 2020-10-16 DIAGNOSIS — Z7982 Long term (current) use of aspirin: Secondary | ICD-10-CM | POA: Diagnosis not present

## 2020-10-16 DIAGNOSIS — K219 Gastro-esophageal reflux disease without esophagitis: Secondary | ICD-10-CM | POA: Insufficient documentation

## 2020-10-16 DIAGNOSIS — K30 Functional dyspepsia: Secondary | ICD-10-CM | POA: Diagnosis not present

## 2020-10-16 DIAGNOSIS — Z1211 Encounter for screening for malignant neoplasm of colon: Secondary | ICD-10-CM | POA: Diagnosis not present

## 2020-10-16 DIAGNOSIS — Z7984 Long term (current) use of oral hypoglycemic drugs: Secondary | ICD-10-CM | POA: Insufficient documentation

## 2020-10-16 DIAGNOSIS — Z79899 Other long term (current) drug therapy: Secondary | ICD-10-CM | POA: Diagnosis not present

## 2020-10-16 DIAGNOSIS — K2970 Gastritis, unspecified, without bleeding: Secondary | ICD-10-CM | POA: Insufficient documentation

## 2020-10-16 DIAGNOSIS — Z794 Long term (current) use of insulin: Secondary | ICD-10-CM | POA: Insufficient documentation

## 2020-10-16 DIAGNOSIS — K648 Other hemorrhoids: Secondary | ICD-10-CM | POA: Diagnosis not present

## 2020-10-16 HISTORY — PX: ESOPHAGOGASTRODUODENOSCOPY (EGD) WITH PROPOFOL: SHX5813

## 2020-10-16 HISTORY — PX: BIOPSY: SHX5522

## 2020-10-16 HISTORY — PX: COLONOSCOPY WITH PROPOFOL: SHX5780

## 2020-10-16 LAB — GLUCOSE, CAPILLARY: Glucose-Capillary: 256 mg/dL — ABNORMAL HIGH (ref 70–99)

## 2020-10-16 SURGERY — COLONOSCOPY WITH PROPOFOL
Anesthesia: General

## 2020-10-16 MED ORDER — STERILE WATER FOR IRRIGATION IR SOLN
Status: DC | PRN
  Administered 2020-10-16: 1.5 mL

## 2020-10-16 MED ORDER — PROPOFOL 500 MG/50ML IV EMUL
INTRAVENOUS | Status: DC | PRN
  Administered 2020-10-16: 150 ug/kg/min via INTRAVENOUS

## 2020-10-16 MED ORDER — PHENYLEPHRINE 40 MCG/ML (10ML) SYRINGE FOR IV PUSH (FOR BLOOD PRESSURE SUPPORT)
PREFILLED_SYRINGE | INTRAVENOUS | Status: AC
  Filled 2020-10-16: qty 10

## 2020-10-16 MED ORDER — LIDOCAINE HCL (CARDIAC) PF 100 MG/5ML IV SOSY
PREFILLED_SYRINGE | INTRAVENOUS | Status: DC | PRN
  Administered 2020-10-16: 50 mg via INTRAVENOUS

## 2020-10-16 MED ORDER — FAMOTIDINE 20 MG PO TABS: 20.0000 mg | ORAL_TABLET | Freq: Two times a day (BID) | ORAL | 5 refills | Status: DC

## 2020-10-16 MED ORDER — PHENYLEPHRINE 40 MCG/ML (10ML) SYRINGE FOR IV PUSH (FOR BLOOD PRESSURE SUPPORT)
PREFILLED_SYRINGE | INTRAVENOUS | Status: DC | PRN
  Administered 2020-10-16: 80 ug via INTRAVENOUS

## 2020-10-16 MED ORDER — PROPOFOL 10 MG/ML IV BOLUS
INTRAVENOUS | Status: DC | PRN
  Administered 2020-10-16: 100 mg via INTRAVENOUS
  Administered 2020-10-16 (×2): 50 mg via INTRAVENOUS

## 2020-10-16 MED ORDER — LACTATED RINGERS IV SOLN
INTRAVENOUS | Status: DC
  Administered 2020-10-16: 1000 mL via INTRAVENOUS

## 2020-10-16 NOTE — Anesthesia Preprocedure Evaluation (Signed)
Anesthesia Evaluation  Patient identified by MRN, date of birth, ID band Patient awake    Reviewed: Allergy & Precautions, NPO status , Patient's Chart, lab work & pertinent test results  History of Anesthesia Complications Negative for: history of anesthetic complications  Airway Mallampati: II  TM Distance: >3 FB Neck ROM: Full    Dental  (+) Dental Advisory Given, Teeth Intact   Pulmonary former smoker,    Pulmonary exam normal breath sounds clear to auscultation       Cardiovascular Exercise Tolerance: Good hypertension, Pt. on medications  Rhythm:Regular Rate:Normal     Neuro/Psych negative psych ROS   GI/Hepatic Neg liver ROS, GERD  Medicated and Controlled,  Endo/Other  diabetes, Well Controlled, Type 2, Oral Hypoglycemic Agents  Renal/GU negative Renal ROS     Musculoskeletal negative musculoskeletal ROS (+)   Abdominal   Peds  Hematology negative hematology ROS (+)   Anesthesia Other Findings   Reproductive/Obstetrics negative OB ROS                           Anesthesia Physical Anesthesia Plan  ASA: II  Anesthesia Plan: General   Post-op Pain Management:    Induction: Intravenous  PONV Risk Score and Plan:   Airway Management Planned: Nasal Cannula and Natural Airway  Additional Equipment:   Intra-op Plan:   Post-operative Plan:   Informed Consent: I have reviewed the patients History and Physical, chart, labs and discussed the procedure including the risks, benefits and alternatives for the proposed anesthesia with the patient or authorized representative who has indicated his/her understanding and acceptance.     Dental advisory given  Plan Discussed with: CRNA and Surgeon  Anesthesia Plan Comments:         Anesthesia Quick Evaluation

## 2020-10-16 NOTE — Op Note (Signed)
[facility] Patient Name: Angeliah Wisdom Procedure Date: 10/16/2020 9:08 AM MRN: [id number] Date of Birth: 1971-01-25 Attending MD: Elon Alas. Abbey Chatters DO CSN: [id number] Age: 50 Admit Type: Outpatient Procedure:                Upper GI endoscopy Indications:              Functional Dyspepsia Providers:                Elon Alas. Abbey Chatters, DO, Caprice Kluver, Raphael Gibney,                            Technician Referring MD:              Medicines:                See the Anesthesia note for documentation of the                            administered medications Complications:            No immediate complications. Estimated Blood Loss:     Estimated blood loss was minimal. Procedure:                Pre-Anesthesia Assessment:                           - The anesthesia plan was to use monitored                            anesthesia care (MAC).                           After obtaining informed consent, the endoscope was                            passed under direct vision. Throughout the                            procedure, the patient's blood pressure, pulse, and                            oxygen saturations were monitored continuously. The                            [id number]-H190 ([id number]) scope was introduced through the                            mouth, and advanced to the second part of duodenum.                            The upper GI endoscopy was accomplished without                            difficulty. The patient tolerated the procedure                            well. Scope In: 9:16:15 AM Scope Out:  9:20:10 AM Total Procedure Duration: 0 hours 3 minutes 55 seconds  Findings:      There is no endoscopic evidence of Barrett's esophagus, bleeding, areas       of erosion, esophagitis, hiatal hernia, ulcerations or varices in the       entire esophagus.      The Z-line was regular and was found 37 cm from the incisors.      Localized moderate inflammation characterized  by erosions and erythema       was found in the gastric antrum. Biopsies were taken with a cold forceps       for Helicobacter pylori testing.      The duodenal bulb, first portion of the duodenum and second portion of       the duodenum were normal. Impression:               - Z-line regular, 37 cm from the incisors.                           - Gastritis. Biopsied.                           - Normal duodenal bulb, first portion of the                            duodenum and second portion of the duodenum. Moderate Sedation:      Per Anesthesia Care Recommendation:           - Patient has a contact number available for                            emergencies. The signs and symptoms of potential                            delayed complications were discussed with the                            patient. Return to normal activities tomorrow.                            Written discharge instructions were provided to the                            patient.                           - Resume previous diet.                           - Continue present medications.                           - Await pathology results.                           - Use Pepcid (famotidine) 20 mg PO BID. Procedure Code(s):        --- Professional ---  29924, Esophagogastroduodenoscopy, flexible,                            transoral; with biopsy, single or multiple Diagnosis Code(s):        --- Professional ---                           K29.70, Gastritis, unspecified, without bleeding                           K30, Functional dyspepsia CPT copyright 2019 American Medical Association. All rights reserved. The codes documented in this report are preliminary and upon coder review may  be revised to meet current compliance requirements. Elon Alas. Abbey Chatters, DO [place] Abbey Chatters, DO 10/16/2020 9:21:42 AM This report has been signed electronically. Number of Addenda: 0

## 2020-10-16 NOTE — Op Note (Signed)
[facility] Patient Name: Kayla Wilkinson Procedure Date: 10/16/2020 9:24 AM MRN: [id number] Date of Birth: February 06, 1971 Attending MD: Elon Alas. Edgar Frisk CSN: [id number] Age: 50 Admit Type: Outpatient Procedure:                Colonoscopy Indications:              Screening for colorectal malignant neoplasm Providers:                Elon Alas. Abbey Chatters, DO, Caprice Kluver, Raphael Gibney,                            Technician Referring MD:              Medicines:                See the Anesthesia note for documentation of the                            administered medications Complications:            No immediate complications. Estimated Blood Loss:     Estimated blood loss: none. Procedure:                Pre-Anesthesia Assessment:                           - The anesthesia plan was to use monitored                            anesthesia care (MAC).                           After obtaining informed consent, the colonoscope                            was passed under direct vision. Throughout the                            procedure, the patient's blood pressure, pulse, and                            oxygen saturations were monitored continuously. The                            [id number]-HQ190L ([id number]) scope was introduced through                            the anus and advanced to the the cecum, identified                            by appendiceal orifice and ileocecal valve. The                            colonoscopy was performed without difficulty. The                            patient tolerated the procedure well. The quality  of the bowel preparation was evaluated using the                            BBPS [facility] Bowel Preparation Scale) with scores                            of: Right Colon = 2 (minor amount of residual                            staining, small fragments of stool and/or opaque                            liquid, but mucosa seen  well), Transverse Colon = 3                            (entire mucosa seen well with no residual staining,                            small fragments of stool or opaque liquid) and Left                            Colon = 3 (entire mucosa seen well with no residual                            staining, small fragments of stool or opaque                            liquid). The total BBPS score equals 8. The quality                            of the bowel preparation was good. Scope In: 9:25:35 AM Scope Out: 9:41:51 AM Scope Withdrawal Time: 0 hours 10 minutes 19 seconds  Total Procedure Duration: 0 hours 16 minutes 16 seconds  Findings:      The perianal and digital rectal examinations were normal.      Non-bleeding internal hemorrhoids were found during endoscopy.      Multiple small and large-mouthed diverticula were found in the sigmoid       colon and descending colon.      The exam was otherwise without abnormality. Impression:               - Non-bleeding internal hemorrhoids.                           - Diverticulosis in the sigmoid colon and in the                            descending colon.                           - The examination was otherwise normal.                           - No specimens collected. Moderate Sedation:  Per Anesthesia Care Recommendation:           - Patient has a contact number available for                            emergencies. The signs and symptoms of potential                            delayed complications were discussed with the                            patient. Return to normal activities tomorrow.                            Written discharge instructions were provided to the                            patient.                           - Resume previous diet.                           - Continue present medications.                           - Repeat colonoscopy in 10 years for screening                            purposes.                            - Return to GI clinic in 3 months. Procedure Code(s):        --- Professional ---                           E9937, Colorectal cancer screening; colonoscopy on                            individual not meeting criteria for high risk Diagnosis Code(s):        --- Professional ---                           Z12.11, Encounter for screening for malignant                            neoplasm of colon                           K64.8, Other hemorrhoids                           K57.30, Diverticulosis of large intestine without                            perforation or abscess without bleeding CPT copyright 2019 American Medical Association. All rights reserved. The codes documented in this  report are preliminary and upon coder review may  be revised to meet current compliance requirements. Elon Alas. Abbey Chatters, DO [place] Abbey Chatters, DO 10/16/2020 9:48:05 AM This report has been signed electronically. Number of Addenda: 0

## 2020-10-16 NOTE — Anesthesia Postprocedure Evaluation (Signed)
Anesthesia Post Note  Patient: Kayla Wilkinson  Procedure(s) Performed: COLONOSCOPY WITH PROPOFOL (N/A ) ESOPHAGOGASTRODUODENOSCOPY (EGD) WITH PROPOFOL (N/A ) BIOPSY  Patient location during evaluation: Phase II Anesthesia Type: General Level of consciousness: awake and alert and oriented Pain management: pain level controlled Vital Signs Assessment: post-procedure vital signs reviewed and stable Respiratory status: spontaneous breathing and respiratory function stable Cardiovascular status: blood pressure returned to baseline Postop Assessment: no apparent nausea or vomiting Anesthetic complications: no   No complications documented.   Last Vitals:  Vitals:   10/16/20 0951 10/16/20 0955  BP: (!) 110/54   Pulse:  87  Resp:  16  Temp:    SpO2:  99%    Last Pain:  Vitals:   10/16/20 0947  TempSrc: Oral  PainSc: 0-No pain                 Julieana Eshleman C Lemoyne Scarpati

## 2020-10-16 NOTE — Anesthesia Procedure Notes (Signed)
Date/Time: 10/16/2020 9:18 AM Performed by: Orlie Dakin, CRNA Pre-anesthesia Checklist: Patient identified, Emergency Drugs available, Suction available and Patient being monitored Patient Re-evaluated:Patient Re-evaluated prior to induction Oxygen Delivery Method: Nasal cannula Induction Type: IV induction Placement Confirmation: positive ETCO2

## 2020-10-16 NOTE — Discharge Instructions (Signed)
EGD Discharge instructions Please read the instructions outlined below and refer to this sheet in the next few weeks. These discharge instructions provide you with general information on caring for yourself after you leave the hospital. Your doctor may also give you specific instructions. While your treatment has been planned according to the most current medical practices available, unavoidable complications occasionally occur. If you have any problems or questions after discharge, please call your doctor. ACTIVITY  You may resume your regular activity but move at a slower pace for the next 24 hours.   Take frequent rest periods for the next 24 hours.   Walking will help expel (get rid of) the air and reduce the bloated feeling in your abdomen.   No driving for 24 hours (because of the anesthesia (medicine) used during the test).   You may shower.   Do not sign any important legal documents or operate any machinery for 24 hours (because of the anesthesia used during the test).  NUTRITION  Drink plenty of fluids.   You may resume your normal diet.   Begin with a light meal and progress to your normal diet.   Avoid alcoholic beverages for 24 hours or as instructed by your caregiver.  MEDICATIONS  You may resume your normal medications unless your caregiver tells you otherwise.  WHAT YOU CAN EXPECT TODAY  You may experience abdominal discomfort such as a feeling of fullness or "gas" pains.  FOLLOW-UP  Your doctor will discuss the results of your test with you.  SEEK IMMEDIATE MEDICAL ATTENTION IF ANY OF THE FOLLOWING OCCUR:  Excessive nausea (feeling sick to your stomach) and/or vomiting.   Severe abdominal pain and distention (swelling).   Trouble swallowing.   Temperature over 101 F (37.8 C).   Rectal bleeding or vomiting of blood.     Colonoscopy Discharge Instructions  Read the instructions outlined below and refer to this sheet in the next few weeks. These  discharge instructions provide you with general information on caring for yourself after you leave the hospital. Your doctor may also give you specific instructions. While your treatment has been planned according to the most current medical practices available, unavoidable complications occasionally occur.   ACTIVITY  You may resume your regular activity, but move at a slower pace for the next 24 hours.   Take frequent rest periods for the next 24 hours.   Walking will help get rid of the air and reduce the bloated feeling in your belly (abdomen).   No driving for 24 hours (because of the medicine (anesthesia) used during the test).    Do not sign any important legal documents or operate any machinery for 24 hours (because of the anesthesia used during the test).  NUTRITION  Drink plenty of fluids.   You may resume your normal diet as instructed by your doctor.   Begin with a light meal and progress to your normal diet. Heavy or fried foods are harder to digest and may make you feel sick to your stomach (nauseated).   Avoid alcoholic beverages for 24 hours or as instructed.  MEDICATIONS  You may resume your normal medications unless your doctor tells you otherwise.  WHAT YOU CAN EXPECT TODAY  Some feelings of bloating in the abdomen.   Passage of more gas than usual.   Spotting of blood in your stool or on the toilet paper.  IF YOU HAD POLYPS REMOVED DURING THE COLONOSCOPY:  No aspirin products for 7 days or as instructed.  No alcohol for 7 days or as instructed.   Eat a soft diet for the next 24 hours.  FINDING OUT THE RESULTS OF YOUR TEST Not all test results are available during your visit. If your test results are not back during the visit, make an appointment with your caregiver to find out the results. Do not assume everything is normal if you have not heard from your caregiver or the medical facility. It is important for you to follow up on all of your test results.   SEEK IMMEDIATE MEDICAL ATTENTION IF:  You have more than a spotting of blood in your stool.   Your belly is swollen (abdominal distention).   You are nauseated or vomiting.   You have a temperature over 101.   You have abdominal pain or discomfort that is severe or gets worse throughout the day.   The revealed a mild amount inflammation in your stomach.  I took biopsies of this to rule infection with a bacteria called H. pylori.  Await pathology results, my office will contact you.  I have sent in a prescription of famotidine 20 mg twice daily.  You can take this instead of the omeprazole and see if it helps your symptoms more.  It is in the same class of medication as ranitidine.  Your colonoscopy was relatively unremarkable.  I did not find any polyps or evidence of colon cancer.  Recommend repeating in 10 years for colon cancer screening purposes.  Follow-up with GI in 2 to 3 months  I hope you have a great rest of your week!  Elon Alas. Abbey Chatters, D.O. Gastroenterology and Hepatology [facility]

## 2020-10-16 NOTE — Transfer of Care (Signed)
Immediate Anesthesia Transfer of Care Note  Patient: Kayla Wilkinson  Procedure(s) Performed: COLONOSCOPY WITH PROPOFOL (N/A ) ESOPHAGOGASTRODUODENOSCOPY (EGD) WITH PROPOFOL (N/A ) BIOPSY  Patient Location: Short Stay  Anesthesia Type:General  Level of Consciousness: awake, alert  and oriented  Airway & Oxygen Therapy: Patient Spontanous Breathing  Post-op Assessment: Report given to RN, Post -op Vital signs reviewed and stable and Patient moving all extremities X 4  Post vital signs: Reviewed and stable  Last Vitals:  Vitals Value Taken Time  BP    Temp    Pulse    Resp    SpO2      Last Pain:  Vitals:   10/16/20 0910  TempSrc:   PainSc: 0-No pain      Patients Stated Pain Goal: 5 (03/29/29 0762)  Complications: No complications documented.

## 2020-10-16 NOTE — H&P (Signed)
Primary Care Physician:  Ginger Organ Primary Gastroenterologist:  Dr. Abbey Chatters  Pre-Procedure History & Physical: HPI:  Kayla Wilkinson is a 50 y.o. female is here for an EGD for GERD and a colonoscopy for colon cancer screening purposes.  Chronic GERD. Intermittent epigastric pain. Feels like food sits heavy on stomach. Will feel like food will come back up. No solid food dysphagia. Was on Zantac since 2009 but changed to omeprazole in 2019. No NSAIDs.   Past Medical History:  Diagnosis Date  . Acid reflux   . Diabetes mellitus   . Hypertension     Past Surgical History:  Procedure Laterality Date  . BREAST BIOPSY Right 06/01/2019   Procedure: EXCISIONAL BREAST BIOPSY WITH NEEDLE LOCALIZATION;  Surgeon: Virl Cagey, MD;  Location: [facility];  Service: General;  Laterality: Right;  . CARDIAC CATHETERIZATION  2020  . RECTOCELE REPAIR N/A 07/08/2016   Procedure: POSTERIOR REPAIR (RECTOCELE);  Surgeon: Jonnie Kind, MD;  Location: [facility];  Service: Gynecology;  Laterality: N/A;    Prior to Admission medications   Medication Sig Start Date End Date Taking? Authorizing Provider  aspirin EC 81 MG tablet Take 81 mg by mouth daily.   Yes [provider]  glipiZIDE (GLUCOTROL) 5 MG tablet Take 5 mg by mouth daily.    Yes [provider]  Insulin Glargine (LANTUS SOLOSTAR) 100 UNIT/ML Solostar Pen Inject 50 Units into the skin daily at 10 pm. Patient taking differently: Inject 50 Units into the skin daily as needed (High blood sugar). 07/15/17  Yes Nida, Marella Chimes, MD  losartan (COZAAR) 25 MG tablet Take 25 mg by mouth daily.   Yes [provider]  metFORMIN (GLUCOPHAGE) 1000 MG tablet Take 1,000 mg by mouth 2 (two) times daily with a meal.   Yes [provider]  metoprolol tartrate (LOPRESSOR) 25 MG tablet Take 25 mg by mouth 2 (two) times daily.  03/01/19  Yes [provider]  omeprazole (PRILOSEC) 40 MG capsule Take 40 mg  by mouth 2 (two) times daily before a meal.  05/20/19  Yes [provider]  rosuvastatin (CRESTOR) 10 MG tablet Take 10 mg by mouth daily.   Yes [provider]  STEGLATRO 5 MG TABS Take 5 mg by mouth every morning.  05/19/18  Yes [provider]  valACYclovir (VALTREX) 1000 MG tablet Take 1 tablet (1,000 mg total) by mouth 2 (two) times daily. X 10 days, then once daily thereafter Patient taking differently: Take 1,000 mg by mouth 2 (two) times daily. 03/24/16  Yes Booker, Royetta Crochet, CNM  Nirmatrelvir & Ritonavir 20 x 150 MG & 10 x 100MG  TBPK TAKE 2 NIRMATRELVIR TABLETS AND TAKE 1 RITONAVIR TABLET BY MOUTH TWO TIMES DAILY FOR 5 DAYS Patient not taking: Reported on 10/10/2020 07/10/20 07/10/21  Loel Dubonnet, NP    Allergies as of 09/10/2020  . (No Known Allergies)    Family History  Problem Relation Age of Onset  . Diabetes Mother   . Diabetes Maternal Grandmother   . Cancer Other   . Cancer - Cervical Maternal Aunt   . Diabetes Son   . Breast cancer Other   . Colon cancer Neg Hx   . Colon polyps Neg Hx     Social History   Socioeconomic History  . Marital status: Married    Spouse name: Not on file  . Number of children: Not on file  . Years of education: Not on file  .  Highest education level: Not on file  Occupational History  . Not on file  Tobacco Use  . Smoking status: Former Smoker    Packs/day: 0.25    Years: 1.50    Pack years: 0.37    Types: Cigarettes    Quit date: 07/02/2012    Years since quitting: 8.2  . Smokeless tobacco: Never Used  Vaping Use  . Vaping Use: Never used  Substance and Sexual Activity  . 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 Drug use: No  . Sexual activity: Not Currently    Birth control/protection: None  Other Topics Concern  . Not on file  Social History Narrative  . Not on file   Social Determinants of Health   Financial Resource Strain: Not on file  Food Insecurity: Not on  file  Transportation Needs: Not on file  Physical Activity: Not on file  Stress: Not on file  Social Connections: Not on file  Intimate Partner Violence: Not on file    Review of Systems: See HPI, otherwise negative ROS  Physical Exam: Vital signs in last 24 hours: Temp:  [98.2 F (36.8 C)] 98.2 F (36.8 C) (04/12 0824) Pulse Rate:  [95] 95 (04/12 0824) Resp:  [19] 19 (04/12 0824) BP: (125)/(65) 125/65 (04/12 0824) SpO2:  [94 %] 94 % (04/12 0824)   General:   Alert,  Well-developed, well-nourished, pleasant and cooperative in NAD Head:  Normocephalic and atraumatic. Eyes:  Sclera clear, no icterus.   Conjunctiva pink. Ears:  Normal auditory acuity. Nose:  No deformity, discharge,  or lesions. Mouth:  No deformity or lesions, dentition normal. Neck:  Supple; no masses or thyromegaly. Lungs:  Clear throughout to auscultation.   No wheezes, crackles, or rhonchi. No acute distress. Heart:  Regular rate and rhythm; no murmurs, clicks, rubs,  or gallops. Abdomen:  Soft, nontender and nondistended. No masses, hepatosplenomegaly or hernias noted. Normal bowel sounds, without guarding, and without rebound.   Msk:  Symmetrical without gross deformities. Normal posture. Extremities:  Without clubbing or edema. Neurologic:  Alert and  oriented x4;  grossly normal neurologically. Skin:  Intact without significant lesions or rashes. Cervical Nodes:  No significant cervical adenopathy. Psych:  Alert and cooperative. Normal mood and affect.  Impression/Plan: Kayla Wilkinson is here for an EGD for GERD and a colonoscopy for colon cancer screening purposes  The risks of the procedure including infection, bleed, or perforation as well as benefits, limitations, alternatives and imponderables have been reviewed with the patient. Questions have been answered. All parties agreeable.

## 2020-10-17 LAB — SURGICAL PATHOLOGY

## 2020-10-22 ENCOUNTER — Telehealth: Payer: Self-pay | Admitting: Internal Medicine

## 2020-10-22 ENCOUNTER — Telehealth: Payer: Self-pay

## 2020-10-22 ENCOUNTER — Encounter (HOSPITAL_COMMUNITY): Payer: Self-pay | Admitting: Internal Medicine

## 2020-10-22 NOTE — Telephone Encounter (Signed)
[phone] PATIENT PICKED UP HER PRESCRIPTION AND THE PHARMACIST WANTED HER TO FIND OUT IF SHE IS TO CONTINUE TAKING HER OTHER MEDICATION SHE WAS PRESCRIBED FROM HERE ALSO.

## 2020-10-22 NOTE — Telephone Encounter (Signed)
Pt called she as well as the pharmacist wants to know if she is to be taking Pepcid and Prilosec. Prilosec was prescribed first then Dr. Abbey Chatters prescribed pepcid this month. Please advise. I don't see any note advising where the Prilosec was suppose to be stopped.

## 2020-10-22 NOTE — Telephone Encounter (Signed)
Would recommend omeprazole BID. Can take Pepcid as needed for breakthrough.

## 2020-10-25 ENCOUNTER — Ambulatory Visit (INDEPENDENT_AMBULATORY_CARE_PROVIDER_SITE_OTHER): Payer: No Typology Code available for payment source | Admitting: Student

## 2020-10-25 ENCOUNTER — Other Ambulatory Visit: Payer: Self-pay

## 2020-10-25 ENCOUNTER — Encounter: Payer: Self-pay | Admitting: Student

## 2020-10-25 VITALS — BP 126/82 | Ht 62.5 in | Wt 214.0 lb

## 2020-10-25 DIAGNOSIS — R0781 Pleurodynia: Secondary | ICD-10-CM | POA: Diagnosis not present

## 2020-10-25 DIAGNOSIS — E782 Mixed hyperlipidemia: Secondary | ICD-10-CM

## 2020-10-25 DIAGNOSIS — Q211 Atrial septal defect: Secondary | ICD-10-CM

## 2020-10-25 DIAGNOSIS — Q2112 Patent foramen ovale: Secondary | ICD-10-CM

## 2020-10-25 DIAGNOSIS — R002 Palpitations: Secondary | ICD-10-CM

## 2020-10-25 NOTE — Telephone Encounter (Signed)
Pt already following instructions of Dr. Abbey Chatters from procedure note

## 2020-10-25 NOTE — Patient Instructions (Signed)
Medication Instructions:  Your physician recommends that you continue on your current medications as directed. Please refer to the Current Medication list given to you today.  *If you need a refill on your cardiac medications before your next appointment, please call your pharmacy*   Lab Work: NONE   If you have labs (blood work) drawn today and your tests are completely normal, you will receive your results only by: Marland Kitchen MyChart Message (if you have MyChart) OR . A paper copy in the mail If you have any lab test that is abnormal or we need to change your treatment, we will call you to review the results.   Testing/Procedures: Your physician has requested that you have an echocardiogram. Echocardiography is a painless test that uses sound waves to create images of your heart. It provides your doctor with information about the size and shape of your heart and how well your heart's chambers and valves are working. This procedure takes approximately one hour. There are no restrictions for this procedure.   Follow-Up: At [facility], you and your health needs are our priority.  As part of our continuing mission to provide you with exceptional heart care, we have created designated Provider Care Teams.  These Care Teams include your primary Cardiologist (physician) and Advanced Practice Providers (APPs -  Physician Assistants and Nurse Practitioners) who all work together to provide you with the care you need, when you need it.  We recommend signing up for the patient portal called "MyChart".  Sign up information is provided on this After Visit Summary.  MyChart is used to connect with patients for Virtual Visits (Telemedicine).  Patients are able to view lab/test results, encounter notes, upcoming appointments, etc.  Non-urgent messages can be sent to your provider as well.   To learn more about what you can do with MyChart, go to [email or web address].    Your next appointment:    To Be  Determined   The format for your next appointment:   In Person  Provider:   Bernerd Pho, PA-C   Other Instructions Thank you for choosing [facility]!

## 2020-10-25 NOTE — Progress Notes (Signed)
Cardiology Office Note    Date:  10/25/2020   ID:  ODILE VELOSO, DOB 08/10/1970, MRN [id number]  PCP:  Cory Munch, PA-C  Cardiologist: Dorris Carnes, MD    Chief Complaint  Patient presents with  . Follow-up    Chest Pain    History of Present Illness:    Kayla Wilkinson is a 50 y.o. female with past medical history of HLD, Type II DM and abnormal EKG (coronary calcium score of 0 by CT in 08/2019) who presents to the office today for evaluation of chest pain.  She was examined by Dr. Harrington Challenger in 06/2019 for follow-up from a recent EKG which showed a computer-generated read of remote anterior and inferior MI.  At the time of her visit she reported intermittent episodes of chest discomfort which were sharp and could last for an hour at a time. Her EKG did show Q waves in the inferior leads but her poor R wave progression was felt to be secondary to her body habitus. Given her risk factors, a Coronary CT was recommended for further evaluation. This was performed in 08/2019 and showed a coronary calcium score of 0 and while imaging was limited due to motion artifact, there was no evidence of significant coronary disease. It was recommended she follow-up with GI symptoms as symptoms were possibly secondary to this.  She did follow-up with GI earlier this month and reported feeling like food was sitting in her stomach. An endoscopy and colonoscopy were recommended for further evaluation and performed on 10/16/2020. Her endoscopy showed gastritis with normal duodenal bulb and duodenum. Colonoscopy showed nonbleeding internal hemorrhoids and diverticulosis in the sigmoid and descending colon.  In talking with the patient today, she reports having discomfort along her left pectoral region since 07/2020. She describes it as a discomfort/ache and it typically occurs at rest and can last for hours at a time. Sometimes worse with leaning forward and typically improves when lying down  at night. No association of pain with exertion. She does describe occasional neck pain but typically does not correlate with the chest pain. She reports her breathing has overall been stable and denies any recent orthopnea, PND or pitting edema.  She was previously having palpitations but was prescribed Lopressor by her PCP with improvement in her symptoms. She does not consume alcohol and denies any excessive caffeine use.   Past Medical History:  Diagnosis Date  . Acid reflux   . Diabetes mellitus   . Hypertension     Past Surgical History:  Procedure Laterality Date  . BIOPSY  10/16/2020   Procedure: BIOPSY;  Surgeon: Eloise Harman, DO;  Location: [facility];  Service: Endoscopy;;  . BREAST BIOPSY Right 06/01/2019   Procedure: EXCISIONAL BREAST BIOPSY WITH NEEDLE LOCALIZATION;  Surgeon: Virl Cagey, MD;  Location: [facility];  Service: General;  Laterality: Right;  . CARDIAC CATHETERIZATION  2020  . COLONOSCOPY WITH PROPOFOL N/A 10/16/2020   Procedure: COLONOSCOPY WITH PROPOFOL;  Surgeon: Eloise Harman, DO;  Location: [facility];  Service: Endoscopy;  Laterality: N/A;  AM (diabetic)  . ESOPHAGOGASTRODUODENOSCOPY (EGD) WITH PROPOFOL N/A 10/16/2020   Procedure: ESOPHAGOGASTRODUODENOSCOPY (EGD) WITH PROPOFOL;  Surgeon: Eloise Harman, DO;  Location: [facility];  Service: Endoscopy;  Laterality: N/A;  . RECTOCELE REPAIR N/A 07/08/2016   Procedure: POSTERIOR REPAIR (RECTOCELE);  Surgeon: Jonnie Kind, MD;  Location: [facility];  Service: Gynecology;  Laterality: N/A;    Current Medications: Outpatient Medications  Prior to Visit  Medication Sig Dispense Refill  . aspirin EC 81 MG tablet Take 81 mg by mouth daily.    . famotidine (PEPCID) 20 MG tablet Take 1 tablet (20 mg total) by mouth 2 (two) times daily. 60 tablet 5  . glipiZIDE (GLUCOTROL) 5 MG tablet Take 5 mg by mouth daily.     . Insulin Glargine (LANTUS SOLOSTAR) 100 UNIT/ML Solostar Pen Inject 50 Units into the  skin daily at 10 pm. (Patient taking differently: Inject 50 Units into the skin daily as needed (High blood sugar).) 5 pen 2  . losartan (COZAAR) 25 MG tablet Take 25 mg by mouth daily.    . metFORMIN (GLUCOPHAGE) 1000 MG tablet Take 1,000 mg by mouth 2 (two) times daily with a meal.    . metoprolol tartrate (LOPRESSOR) 25 MG tablet Take 25 mg by mouth 2 (two) times daily.     . rosuvastatin (CRESTOR) 10 MG tablet Take 10 mg by mouth daily.    Marland Kitchen STEGLATRO 5 MG TABS Take 5 mg by mouth every morning.   2  . valACYclovir (VALTREX) 1000 MG tablet Take 1 tablet (1,000 mg total) by mouth 2 (two) times daily. X 10 days, then once daily thereafter (Patient taking differently: Take 1,000 mg by mouth 2 (two) times daily.) 30 tablet 11  . omeprazole (PRILOSEC) 40 MG capsule Take 40 mg by mouth 2 (two) times daily before a meal.  (Patient not taking: Reported on 10/25/2020)     No facility-administered medications prior to visit.     Allergies:   Patient has no known allergies.   Social History   Socioeconomic History  . Marital status: Married    Spouse name: Not on file  . Number of children: Not on file  . Years of education: Not on file  . Highest education level: Not on file  Occupational History  . Not on file  Tobacco Use  . Smoking status: Former Smoker    Packs/day: 0.25    Years: 1.50    Pack years: 0.37    Types: Cigarettes    Quit date: 07/02/2012    Years since quitting: 8.3  . Smokeless tobacco: Never Used  Vaping Use  . Vaping Use: Never used  Substance and Sexual Activity  . 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 Drug use: No  . Sexual activity: Not Currently    Birth control/protection: None  Other Topics Concern  . Not on file  Social History Narrative  . Not on file   Social Determinants of Health   Financial Resource Strain: Not on file  Food Insecurity: Not on file  Transportation Needs: Not on file  Physical Activity: Not on file   Stress: Not on file  Social Connections: Not on file     Family History:  The patient's family history includes Breast cancer in an other family member; Cancer in an other family member; Cancer - Cervical in her maternal aunt; Diabetes in her maternal grandmother, mother, and son.   Review of Systems:   Please see the history of present illness.     All other systems reviewed and are otherwise negative except as noted above.   Physical Exam:    VS:  BP 126/82   Ht 5' 2.5" (1.588 m)   Wt 214 lb (97.1 kg)   LMP 04/05/2017   BMI 38.52 kg/m    General: Well developed, well nourished,female appearing in no  acute distress. Head: Normocephalic, atraumatic. Neck: No carotid bruits. JVD not elevated.  Lungs: Respirations regular and unlabored, without wheezes or rales.  Heart: Regular rate and rhythm. No S3 or S4.  No murmur, no rubs, or gallops appreciated. Abdomen: Appears non-distended. No obvious abdominal masses. Msk:  Strength and tone appear normal for age. No obvious joint deformities or effusions. Extremities: No clubbing or cyanosis. No lower extremity edema.  Distal pedal pulses are 2+ bilaterally. Neuro: Alert and oriented X 3. Moves all extremities spontaneously. No focal deficits noted. Psych:  Responds to questions appropriately with a normal affect. Skin: No rashes or lesions noted  Wt Readings from Last 3 Encounters:  10/25/20 214 lb (97.1 kg)  10/12/20 215 lb (97.5 kg)  07/09/20 210 lb (95.3 kg)     Studies/Labs Reviewed:   EKG:  EKG is not ordered today.  EKG from 07/09/2020 is reviewed and shows sinus tachycardia, HR 104 with inferior infarct pattern. No acute ST changes when compared to prior tracings.   Recent Labs: 10/12/2020: BUN 16; Creatinine, Ser 0.41; Potassium 3.9; Sodium 138   Lipid Panel No results found for: CHOL, TRIG, HDL, CHOLHDL, VLDL, LDLCALC, LDLDIRECT  Additional studies/ records that were reviewed today include:   Coronary CT:  08/2019  FINDINGS: Non-cardiac: See separate report from [facility].  Pulmonary veins drained normally to the left atrium.  Possible PFO.  Calcium Score: 0 Agatston units.  Coronary Arteries: Right dominant with no anomalies  LM: No significant coronary disease.  LAD system: No significant coronary disease.  Circumflex system: No significant coronary disease.  RCA system: No significant coronary disease.  IMPRESSION: 1. Coronary artery calcium score 0 Agatston units, suggesting low risk for future cardiac events.  2. Difficult images due to motion artifact, unable to get heart rate below 70 bpm. However, I do not see any significant coronary disease.    Assessment:    1. Pleuritic pain   2. PFO (patent foramen ovale)   3. Palpitations   4. Mixed hyperlipidemia      Plan:   In order of problems listed above:  1. Chest Pain that is Atypical for a Cardiac Etiology - She has been experiencing intermittent pain that can last for hours at a time and is not associated with exertion. Sometimes worse with leaning forward. Recent GI workup showed gastritis but no esophageal abnormalities. She had a coronary calcium score of 0 by CT in 08/2019 and this was reviewed with the patient today. Will plan to obtain an echocardiogram to assess her possible PFO given her CT results and to rule-out any structural abnormalities or effusion. If no significant abnormalities, would recommend she follow-up with her PCP to explore other possibilities such as MSK discomfort or neuropathy given her recent cardiac and GI workup.   2. Possible PFO - It was mentioned in her Coronary CT report she had a possible PFO. Will plan for a bubble study at the time of her upcoming echocardiogram.   3. Palpitations - She reports her symptoms have significantly improved since being started on Lopressor 25mg  BID. If she had recurrent symptoms in the future, could consider an event monitor.    4. HLD - Followed by PCP. She remains on Crestor 10mg  daily.     Medication Adjustments/Labs and Tests Ordered: Current medicines are reviewed at length with the patient today.  Concerns regarding medicines are outlined above.  Medication changes, Labs and Tests ordered today are listed in the Patient Instructions below. Patient Instructions  Medication Instructions:  Your physician recommends that you continue on your current medications as directed. Please refer to the Current Medication list given to you today.  *If you need a refill on your cardiac medications before your next appointment, please call your pharmacy*   Lab Work: NONE   If you have labs (blood work) drawn today and your tests are completely normal, you will receive your results only by: Marland Kitchen MyChart Message (if you have MyChart) OR . A paper copy in the mail If you have any lab test that is abnormal or we need to change your treatment, we will call you to review the results.   Testing/Procedures: Your physician has requested that you have an echocardiogram. Echocardiography is a painless test that uses sound waves to create images of your heart. It provides your doctor with information about the size and shape of your heart and how well your heart's chambers and valves are working. This procedure takes approximately one hour. There are no restrictions for this procedure.   Follow-Up: At [facility], you and your health needs are our priority.  As part of our continuing mission to provide you with exceptional heart care, we have created designated Provider Care Teams.  These Care Teams include your primary Cardiologist (physician) and Advanced Practice Providers (APPs -  Physician Assistants and Nurse Practitioners) who all work together to provide you with the care you need, when you need it.  We recommend signing up for the patient portal called "MyChart".  Sign up information is provided on this After Visit  Summary.  MyChart is used to connect with patients for Virtual Visits (Telemedicine).  Patients are able to view lab/test results, encounter notes, upcoming appointments, etc.  Non-urgent messages can be sent to your provider as well.   To learn more about what you can do with MyChart, go to [email or web address].    Your next appointment:    To Be Determined   The format for your next appointment:   In Person  Provider:   Bernerd Pho, PA-C   Other Instructions Thank you for choosing [facility]!        Signed, Erma Heritage, PA-C  10/25/2020 7:57 PM    Kayla S. [address] [place], [place] [address] Phone: [phone] Fax: [phone]

## 2020-11-22 ENCOUNTER — Other Ambulatory Visit: Payer: Self-pay | Admitting: Student

## 2020-11-22 ENCOUNTER — Ambulatory Visit (HOSPITAL_COMMUNITY)
Admission: RE | Admit: 2020-11-22 | Discharge: 2020-11-22 | Disposition: A | Discharge status: Home / Self Care | Payer: No Typology Code available for payment source | Source: Ambulatory Visit | Attending: Student | Admitting: Student

## 2020-11-22 ENCOUNTER — Other Ambulatory Visit: Payer: Self-pay

## 2020-11-22 DIAGNOSIS — R079 Chest pain, unspecified: Secondary | ICD-10-CM

## 2020-11-22 DIAGNOSIS — R0781 Pleurodynia: Secondary | ICD-10-CM | POA: Diagnosis present

## 2020-11-22 LAB — ECHOCARDIOGRAM COMPLETE BUBBLE STUDY
AR max vel: 1.92 cm2
AV Area VTI: 1.58 cm2
AV Area mean vel: 1.85 cm2
AV Mean grad: 6 mmHg
AV Peak grad: 9.5 mmHg
Ao pk vel: 1.54 m/s
Area-P 1/2: 3.34 cm2
S' Lateral: 2.7 cm

## 2020-11-22 NOTE — Progress Notes (Signed)
*  PRELIMINARY RESULTS* Echocardiogram 2D Echocardiogram has been performed.  Alvino Chapel, JR 11/22/2020, 4:00 PM

## 2021-01-08 ENCOUNTER — Encounter: Payer: Self-pay | Admitting: Student

## 2021-01-08 ENCOUNTER — Ambulatory Visit (INDEPENDENT_AMBULATORY_CARE_PROVIDER_SITE_OTHER): Payer: No Typology Code available for payment source | Admitting: Student

## 2021-01-08 ENCOUNTER — Other Ambulatory Visit: Payer: Self-pay

## 2021-01-08 VITALS — BP 124/70 | HR 95 | Ht 63.0 in | Wt 216.0 lb

## 2021-01-08 DIAGNOSIS — R0789 Other chest pain: Secondary | ICD-10-CM

## 2021-01-08 DIAGNOSIS — I10 Essential (primary) hypertension: Secondary | ICD-10-CM | POA: Diagnosis not present

## 2021-01-08 DIAGNOSIS — E782 Mixed hyperlipidemia: Secondary | ICD-10-CM

## 2021-01-08 DIAGNOSIS — Q248 Other specified congenital malformations of heart: Secondary | ICD-10-CM | POA: Diagnosis not present

## 2021-01-08 NOTE — Progress Notes (Addendum)
Cardiology Office Note    Date:  01/08/2021   ID:  Kayla Wilkinson, DOB 04-30-1971, MRN [id number]  PCP:  Cory Munch, PA-C  Cardiologist: Dorris Carnes, MD    Chief Complaint  Patient presents with   Follow-up    To review echo results    History of Present Illness:    Kayla Wilkinson is a 50 y.o. female with past medical history of HTN, Type 2 DM, gastritis and history of chest pain (Coronary CT in 08/2019 showing calcium score of 0) who presents to the office today for 55-month follow-up.   She was examined by myself in 10/2020 for follow-up and reported discomfort along her left pectoral region which had been occurring for several months and could last for hours at a time. We reviewed her recent Coronary CT in detail and she did have a possible PFO by imaging at that time. Was recommended she have an echocardiogram for further evaluation. This showed a preserved EF of 65-70% with no regional WMA. She did have Grade 1 DD, normal RV function and bubble study was positive for shunting suggestive of interatrial shunt.   In talking with the patient today, she reports being active at baseline as she works 2 jobs and takes care of 6 dogs at the house. She denies any exertional chest pain but does experience discomfort along her right pectoral region if bending down to pick something up. No associated dyspnea on exertion, orthopnea, PND or pitting edema. She does consume a significant amount of fluids including 2-3 large cups of water a day along with 1 cup of coffee and 2-3 regular Pepsi sodas. She has been trying to monitor her diet more closely in an effort to lose weight.    Past Medical History:  Diagnosis Date   Acid reflux    Chest pain    a. Coronary CT in 08/2019 showing calcium score of 0   Diabetes mellitus    Hypertension     Past Surgical History:  Procedure Laterality Date   BIOPSY  10/16/2020   Procedure: BIOPSY;  Surgeon: Kayla Harman, DO;   [facility];  Service: Endoscopy;;   BREAST BIOPSY Right 06/01/2019   Procedure: EXCISIONAL BREAST BIOPSY WITH NEEDLE LOCALIZATION;  Surgeon: Kayla Cagey, MD;  Location: [facility];  Service: General;  Laterality: Right;   CARDIAC CATHETERIZATION  2020   COLONOSCOPY WITH PROPOFOL N/A 10/16/2020   Procedure: COLONOSCOPY WITH PROPOFOL;  Surgeon: Kayla Harman, DO;  [facility];  Service: Endoscopy;  Laterality: N/A;  AM (diabetic)   ESOPHAGOGASTRODUODENOSCOPY (EGD) WITH PROPOFOL N/A 10/16/2020   Procedure: ESOPHAGOGASTRODUODENOSCOPY (EGD) WITH PROPOFOL;  Surgeon: Kayla Harman, DO;  [facility];  Service: Endoscopy;  Laterality: N/A;   RECTOCELE REPAIR N/A 07/08/2016   Procedure: POSTERIOR REPAIR (RECTOCELE);  Surgeon: Kayla Kind, MD;  Location: [facility];  Service: Gynecology;  Laterality: N/A;    Current Medications: Outpatient Medications Prior to Visit  Medication Sig Dispense Refill   aspirin EC 81 MG tablet Take 81 mg by mouth daily.     glipiZIDE (GLUCOTROL) 5 MG tablet Take 5 mg by mouth daily.      Insulin Glargine (LANTUS SOLOSTAR) 100 UNIT/ML Solostar Pen Inject 50 Units into the skin daily at 10 pm. (Patient taking differently: Inject 50 Units into the skin daily as needed (High blood sugar).) 5 pen 2   losartan (COZAAR) 25 MG tablet Take 25 mg by mouth  daily.     metFORMIN (GLUCOPHAGE) 1000 MG tablet Take 1,000 mg by mouth 2 (two) times daily with a meal.     metoprolol tartrate (LOPRESSOR) 25 MG tablet Take 25 mg by mouth 2 (two) times daily.      omeprazole (PRILOSEC) 40 MG capsule Take 40 mg by mouth 2 (two) times daily before a meal.     rosuvastatin (CRESTOR) 10 MG tablet Take 10 mg by mouth daily.     STEGLATRO 5 MG TABS Take 5 mg by mouth every morning.   2   valACYclovir (VALTREX) 1000 MG tablet Take 1 tablet (1,000 mg total) by mouth 2 (two) times daily. X 10 days, then once daily thereafter (Patient taking differently: Take  1,000 mg by mouth 2 (two) times daily.) 30 tablet 11   famotidine (PEPCID) 20 MG tablet Take 1 tablet (20 mg total) by mouth 2 (two) times daily. 60 tablet 5   No facility-administered medications prior to visit.     Allergies:   Patient has no known allergies.   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   Drug use: No   Sexual activity: Not Currently    Birth control/protection: None  Other Topics Concern   Not on file  Social History Narrative   Not on file   Social Determinants of Health   Financial Resource Strain: Not on file  Food Insecurity: Not on file  Transportation Needs: Not on file  Physical Activity: Not on file  Stress: Not on file  Social Connections: Not on file     Family History:  The patient's family history includes Breast cancer in an other family member; Cancer in an other family member; Cancer - Cervical in her maternal aunt; Diabetes in her maternal grandmother, mother, and son.   Review of Systems:    Please see the history of present illness.     All other systems reviewed and are otherwise negative except as noted above.   Physical Exam:    VS:  BP 124/70   Pulse 95   Ht 5\' 3"  (1.6 m)   Wt 216 lb (98 kg)   LMP 04/05/2017   SpO2 96%   BMI 38.26 kg/m    General: Well developed, well nourished,female appearing in no acute distress. Head: Normocephalic, atraumatic. Neck: No carotid bruits. JVD not elevated.  Lungs:  Respirations regular and unlabored, without wheezes or rales.  Heart: Regular rate and rhythm. No S3 or S4.  No murmur, no rubs, or gallops appreciated. Abdomen: Appears non-distended. No obvious abdominal masses. Msk:  Strength and tone appear normal for age. No obvious joint deformities or effusions. Extremities: No clubbing or cyanosis. No pitting edema.  Distal pedal pulses are 2+ bilaterally. Neuro: Alert and oriented X 3. Moves all extremities spontaneously. No focal  deficits noted. Psych:  Responds to questions appropriately with a normal affect. Skin: No rashes or lesions noted  Wt Readings from Last 3 Encounters:  01/08/21 216 lb (98 kg)  10/25/20 214 lb (97.1 kg)  10/12/20 215 lb (97.5 kg)     Studies/Labs Reviewed:   EKG:  EKG is not ordered today.    Recent Labs: 10/12/2020: BUN 16; Creatinine, Ser 0.41; Potassium 3.9; Sodium 138   Lipid Panel No results found for: CHOL, TRIG, HDL, CHOLHDL, VLDL, LDLCALC, LDLDIRECT  Additional studies/ records that were reviewed today include:   Coronary CT: 08/2019 FINDINGS: Non-cardiac: See separate report from [facility].   Pulmonary veins drained normally to the left atrium.  Possible PFO.   Calcium Score: 0 Agatston units.   Coronary Arteries: Right dominant with no anomalies   LM: No significant coronary disease.   LAD system: No significant coronary disease.   Circumflex system: No significant coronary disease.   RCA system: No significant coronary disease.   IMPRESSION: 1. Coronary artery calcium score 0 Agatston units, suggesting low risk for future cardiac events.   2. Difficult images due to motion artifact, unable to get heart rate below 70 bpm. However, I do not see any significant coronary disease.  Echocardiogram: 11/2020 IMPRESSIONS     1. Left ventricular ejection fraction, by estimation, is 65 to 70%. The  left ventricle has normal function. The left ventricle has no regional  wall  motion abnormalities. Left ventricular diastolic parameters are  consistent with Grade I diastolic  dysfunction (impaired relaxation).   2. Right ventricular systolic function is normal. The right ventricular  size is normal.   3. Left atrial size was mildly dilated.   4. The mitral valve is normal in structure. No evidence of mitral valve  regurgitation. No evidence of mitral stenosis.   5. The aortic valve is tricuspid. Aortic valve regurgitation is not  visualized. No aortic stenosis is present.   6. The inferior vena cava is normal in size with greater than 50%  respiratory variability, suggesting right atrial pressure of 3 mmHg.   7. Agitated saline contrast bubble study was positive with shunting  observed within 3-6 cardiac cycles suggestive of interatrial shunt.    Assessment:    1. Interatrial cardiac shunt   2. Atypical chest pain   3. Essential hypertension   4. Mixed hyperlipidemia      Plan:   In order of problems listed above:  1. Interatrial Shunt - Prior Coronary CT was suggestive of a possible PFO and recent echocardiogram showed a preserved EF with no wall motion abnormalities and RV function was normal. Bubble study was positive for shunting suggestive of interatrial shunt. We discussed obtaining a follow-up echocardiogram in 5 years unless symptoms change and indicate sooner evaluation (could consider cMRI if symptoms as well).  2. Atypical Chest Pain for a Cardiac Etiology - Her recent Coronary CT in 2021 showed a coronary calcium score of 0 and her overall pain seems atypical for a cardiac etiology as it is mostly associated with positional changes such as bending down to pick something up off the floor. Reviewed this could possibly be due to breast tissue and she plans to purchase a more supportive bra. She is also focusing on weight loss with dietary changes as well. - No plans for further cardiac testing at this time given her recent reassuring testing.  Continue with risk factor modification.  3. HTN - Her BP is well-controlled at 124/70 during today's  visit. Continue current medication regimen with Losartan 25mg  daily and Lopressor 25mg  BID.   4. HLD - Followed by her PCP. She remains on Crestor 10mg  daily.   Medication Adjustments/Labs and Tests Ordered: Current medicines are reviewed at length with the patient today.  Concerns regarding medicines are outlined above.  Medication changes, Labs and Tests ordered today are listed in the Patient Instructions below. Patient Instructions  Medication Instructions:  Your physician recommends that you continue on your current medications as directed. Please refer to the Current Medication list given to you today.  *If you need a refill on your cardiac medications before your next appointment, please call your pharmacy*   Lab Work: None today  If you have labs (blood work) drawn today and your tests are completely normal, you will receive your results only by: [place] (if you have MyChart) OR A paper copy in the mail If you have any lab test that is abnormal or we need to change your treatment, we will call you to review the results.   Testing/Procedures: None today    Follow-Up: At [facility], you and your health needs are our priority.  As part of our continuing mission to provide you with exceptional heart care, we have created designated Provider Care Teams.  These Care Teams include your primary Cardiologist (physician) and Advanced Practice Providers (APPs -  Physician Assistants and Nurse Practitioners) who all work together to provide you with the care you need, when you need it.  We recommend signing up for the patient portal called "MyChart".  Sign up information is provided on this After Visit Summary.  MyChart is used to connect with patients for Virtual Visits (Telemedicine).  Patients are able to view lab/test results, encounter notes, upcoming appointments, etc.   Non-urgent messages can be sent to your provider as well.   To learn more about what you can do with MyChart, go to [email or web address].    Your next appointment:   1 year(s)  The format for your next appointment:   In Person  Provider:  You may see Dorris Carnes, MD or one of the following Advanced Practice Providers on your designated Care Team:   Bernerd Pho, PA-C     Other Instructions None      Signed, Erma Heritage, Hershal Coria  01/08/2021 5:10 PM    [facility] [address] [place], [place] [address] Phone: [phone] Fax: [phone]

## 2021-01-08 NOTE — Patient Instructions (Signed)
Medication Instructions:  Your physician recommends that you continue on your current medications as directed. Please refer to the Current Medication list given to you today.  *If you need a refill on your cardiac medications before your next appointment, please call your pharmacy*   Lab Work: None today  If you have labs (blood work) drawn today and your tests are completely normal, you will receive your results only by: [place] (if you have MyChart) OR A paper copy in the mail If you have any lab test that is abnormal or we need to change your treatment, we will call you to review the results.   Testing/Procedures: None today    Follow-Up: At [facility], you and your health needs are our priority.  As part of our continuing mission to provide you with exceptional heart care, we have created designated Provider Care Teams.  These Care Teams include your primary Cardiologist (physician) and Advanced Practice Providers (APPs -  Physician Assistants and Nurse Practitioners) who all work together to provide you with the care you need, when you need it.  We recommend signing up for the patient portal called "MyChart".  Sign up information is provided on this After Visit Summary.  MyChart is used to connect with patients for Virtual Visits (Telemedicine).  Patients are able to view lab/test results, encounter notes, upcoming appointments, etc.  Non-urgent messages can be sent to your provider as well.   To learn more about what you can do with MyChart, go to [email or web address].    Your next appointment:   1 year(s)  The format for your next appointment:   In Person  Provider:  You may see Dorris Carnes, MD or one of the following Advanced Practice Providers on your designated Care Team:   Bernerd Pho, PA-C     Other Instructions None

## 2021-01-16 ENCOUNTER — Other Ambulatory Visit (HOSPITAL_COMMUNITY): Payer: Self-pay | Admitting: Physician Assistant

## 2021-01-16 DIAGNOSIS — E2839 Other primary ovarian failure: Secondary | ICD-10-CM

## 2021-01-21 ENCOUNTER — Other Ambulatory Visit: Payer: Self-pay

## 2021-01-21 ENCOUNTER — Ambulatory Visit (HOSPITAL_COMMUNITY)
Admission: RE | Admit: 2021-01-21 | Discharge: 2021-01-21 | Disposition: A | Discharge status: Home / Self Care | Payer: No Typology Code available for payment source | Source: Ambulatory Visit | Attending: Physician Assistant | Admitting: Physician Assistant

## 2021-01-21 DIAGNOSIS — E2839 Other primary ovarian failure: Secondary | ICD-10-CM | POA: Insufficient documentation

## 2021-01-31 ENCOUNTER — Other Ambulatory Visit: Payer: Self-pay

## 2021-01-31 ENCOUNTER — Ambulatory Visit (INDEPENDENT_AMBULATORY_CARE_PROVIDER_SITE_OTHER): Payer: No Typology Code available for payment source | Admitting: Podiatry

## 2021-01-31 ENCOUNTER — Ambulatory Visit (INDEPENDENT_AMBULATORY_CARE_PROVIDER_SITE_OTHER): Payer: No Typology Code available for payment source

## 2021-01-31 ENCOUNTER — Telehealth: Payer: Self-pay | Admitting: Podiatry

## 2021-01-31 ENCOUNTER — Encounter: Payer: Self-pay | Admitting: Podiatry

## 2021-01-31 DIAGNOSIS — M722 Plantar fascial fibromatosis: Secondary | ICD-10-CM

## 2021-01-31 MED ORDER — MELOXICAM 15 MG PO TABS: 15.0000 mg | ORAL_TABLET | Freq: Every day | ORAL | 3 refills | Status: DC

## 2021-01-31 MED ORDER — TRIAMCINOLONE ACETONIDE 40 MG/ML IJ SUSP
20.0000 mg | Freq: Once | INTRAMUSCULAR | Status: AC
  Administered 2021-01-31: 20 mg

## 2021-01-31 NOTE — Patient Instructions (Signed)

## 2021-01-31 NOTE — Telephone Encounter (Signed)
Pt seen Dr Milinda Pointer today and was asking him about some orthotics or shoes she was to have gotten in 2018.Marland Kitchen  Upon checking I called pt to schedule an appt to pick them up but her voicemail was full.. I do have the orthotics and I called pt and told pt we had them and she has a balance on 77.44 on the orthotics. She stated she would come pay it tomorrow and pick them up.

## 2021-01-31 NOTE — Progress Notes (Signed)
Subjective:  Patient ID: Kayla Wilkinson, female    DOB: 16-Oct-1970,  MRN: [id number] HPI Chief Complaint  Patient presents with   Foot Pain    Plantar heel left - aching x 2 months, AM pain, tried Tylenol   New Patient (Initial Visit)    50 y.o. female presents with the above complaint.   ROS: Denies fever chills nausea vomiting muscle aches pains calf pain back pain chest pain shortness of breath.  Past Medical History:  Diagnosis Date   Acid reflux    Chest pain    a. Coronary CT in 08/2019 showing calcium score of 0   Diabetes mellitus    Hypertension    Past Surgical History:  Procedure Laterality Date   BIOPSY  10/16/2020   Procedure: BIOPSY;  Surgeon: Eloise Harman, DO;  [facility];  Service: Endoscopy;;   BREAST BIOPSY Right 06/01/2019   Procedure: EXCISIONAL BREAST BIOPSY WITH NEEDLE LOCALIZATION;  Surgeon: Virl Cagey, MD;  Location: [facility];  Service: General;  Laterality: Right;   CARDIAC CATHETERIZATION  2020   COLONOSCOPY WITH PROPOFOL N/A 10/16/2020   Procedure: COLONOSCOPY WITH PROPOFOL;  Surgeon: Eloise Harman, DO;  [facility];  Service: Endoscopy;  Laterality: N/A;  AM (diabetic)   ESOPHAGOGASTRODUODENOSCOPY (EGD) WITH PROPOFOL N/A 10/16/2020   Procedure: ESOPHAGOGASTRODUODENOSCOPY (EGD) WITH PROPOFOL;  Surgeon: Eloise Harman, DO;  [facility];  Service: Endoscopy;  Laterality: N/A;   RECTOCELE REPAIR N/A 07/08/2016   Procedure: POSTERIOR REPAIR (RECTOCELE);  Surgeon: Jonnie Kind, MD;  Location: [facility];  Service: Gynecology;  Laterality: N/A;    Current Outpatient Medications:    meloxicam (MOBIC) 15 MG tablet, Take 1 tablet (15 mg total) by mouth daily., Disp: 30 tablet, Rfl: 3   aspirin EC 81 MG tablet, Take 81 mg by mouth daily., Disp: , Rfl:    glipiZIDE (GLUCOTROL) 5 MG tablet, Take 5 mg by mouth daily. , Disp: , Rfl:    Insulin Glargine (LANTUS SOLOSTAR) 100 UNIT/ML Solostar Pen, Inject  50 Units into the skin daily at 10 pm. (Patient taking differently: Inject 50 Units into the skin daily as needed (High blood sugar).), Disp: 5 pen, Rfl: 2   losartan (COZAAR) 25 MG tablet, Take 25 mg by mouth daily., Disp: , Rfl:    metFORMIN (GLUCOPHAGE) 1000 MG tablet, Take 1,000 mg by mouth 2 (two) times daily with a meal., Disp: , Rfl:    metoprolol tartrate (LOPRESSOR) 25 MG tablet, Take 25 mg by mouth 2 (two) times daily. , Disp: , Rfl:    omeprazole (PRILOSEC) 40 MG capsule, Take 40 mg by mouth 2 (two) times daily before a meal., Disp: , Rfl:    rosuvastatin (CRESTOR) 10 MG tablet, Take 10 mg by mouth daily., Disp: , Rfl:    STEGLATRO 5 MG TABS, Take 5 mg by mouth every morning. , Disp: , Rfl: 2   valACYclovir (VALTREX) 1000 MG tablet, Take 1 tablet (1,000 mg total) by mouth 2 (two) times daily. X 10 days, then once daily thereafter (Patient taking differently: Take 1,000 mg by mouth 2 (two) times daily.), Disp: 30 tablet, Rfl: 11  No Known Allergies Review of Systems Objective:  There were no vitals filed for this visit.  General: Well developed, nourished, in no acute distress, alert and oriented x3   Dermatological: Skin is warm, dry and supple bilateral. Nails x 10 are well maintained; remaining integument appears unremarkable at this time. There  are no open sores, no preulcerative lesions, no rash or signs of infection present.  Vascular: Dorsalis Pedis artery and Posterior Tibial artery pedal pulses are 2/4 bilateral with immedate capillary fill time. Pedal hair growth present. No varicosities and no lower extremity edema present bilateral.   Neruologic: Grossly intact via light touch bilateral. Vibratory intact via tuning fork bilateral. Protective threshold with Semmes Wienstein monofilament intact to all pedal sites bilateral. Patellar and Achilles deep tendon reflexes 2+ bilateral. No Babinski or clonus noted bilateral.   Musculoskeletal: No gross boney pedal deformities  bilateral. No pain, crepitus, or limitation noted with foot and ankle range of motion bilateral. Muscular strength 5/5 in all groups tested bilateral.  Pain on palpation medial calcaneal tubercle of the left heel.  Gait: Unassisted, Nonantalgic.    Radiographs:  Soft tissue increase in density at the plantar fascial kidney insertion site otherwise no acute findings noted.  Assessment & Plan:   Assessment: Plan fasciitis left.  Plan: I injected her left heel today 20 mg Kenalog 5 mg of Marcaine and started her meloxicam.  Placed in a plantar fascial brace we checked on her orthotics which are here she has a balance due of them when she will pain pick those up tomorrow.  I will follow-up with her in 1 month     Rosaire Cueto T. [place], [place]

## 2021-02-07 ENCOUNTER — Ambulatory Visit: Payer: No Typology Code available for payment source | Admitting: Internal Medicine

## 2021-03-04 ENCOUNTER — Emergency Department (HOSPITAL_COMMUNITY)
Admission: EM | Admit: 2021-03-04 | Discharge: 2021-03-04 | Disposition: A | Discharge status: Home / Self Care | Payer: No Typology Code available for payment source | Attending: Emergency Medicine | Admitting: Emergency Medicine

## 2021-03-04 ENCOUNTER — Encounter (HOSPITAL_COMMUNITY): Payer: Self-pay | Admitting: Emergency Medicine

## 2021-03-04 ENCOUNTER — Other Ambulatory Visit: Payer: Self-pay

## 2021-03-04 ENCOUNTER — Emergency Department (HOSPITAL_COMMUNITY): Payer: No Typology Code available for payment source

## 2021-03-04 DIAGNOSIS — M79602 Pain in left arm: Secondary | ICD-10-CM | POA: Insufficient documentation

## 2021-03-04 DIAGNOSIS — F419 Anxiety disorder, unspecified: Secondary | ICD-10-CM | POA: Diagnosis not present

## 2021-03-04 DIAGNOSIS — R079 Chest pain, unspecified: Secondary | ICD-10-CM | POA: Diagnosis present

## 2021-03-04 DIAGNOSIS — Z5321 Procedure and treatment not carried out due to patient leaving prior to being seen by health care provider: Secondary | ICD-10-CM | POA: Insufficient documentation

## 2021-03-04 NOTE — ED Triage Notes (Signed)
Pt states she was watching TV tonight and suddenly started having left sided chest pain and left arm pain. Pt very anxious during triage.

## 2021-08-22 ENCOUNTER — Other Ambulatory Visit: Payer: Self-pay | Admitting: Physician Assistant

## 2021-08-22 ENCOUNTER — Other Ambulatory Visit (HOSPITAL_COMMUNITY): Payer: Self-pay | Admitting: Physician Assistant

## 2021-08-22 DIAGNOSIS — R1011 Right upper quadrant pain: Secondary | ICD-10-CM

## 2021-09-09 ENCOUNTER — Ambulatory Visit (HOSPITAL_COMMUNITY)
Admission: RE | Admit: 2021-09-09 | Discharge: 2021-09-09 | Disposition: A | Discharge status: Home / Self Care | Payer: No Typology Code available for payment source | Source: Ambulatory Visit | Attending: Physician Assistant | Admitting: Physician Assistant

## 2021-09-09 ENCOUNTER — Other Ambulatory Visit: Payer: Self-pay

## 2021-09-09 DIAGNOSIS — R1011 Right upper quadrant pain: Secondary | ICD-10-CM | POA: Diagnosis not present

## 2021-09-10 ENCOUNTER — Telehealth: Payer: Self-pay | Admitting: Internal Medicine

## 2021-09-10 NOTE — Telephone Encounter (Signed)
[phone] patient called and said that [facility] sent her for a ct scan and there was something regarding her intestines and liver. She googled the diagnosis and wanted to find out if there was something dr carver missed during her procedure.  Please call patient  ?

## 2021-09-11 NOTE — Telephone Encounter (Signed)
Pt was made aware and is not having any issues. Will call back if and when she does to make an appt.  ?

## 2021-09-11 NOTE — Telephone Encounter (Signed)
Pt had CT done on 09/09/2021. Results are in Epic and state:  ?   IMPRESSION: ?1. Hepatic steatosis. ?2. Colonic diverticulosis without findings of acute diverticulitis. ?3. Large stool burden in the visualized colon. ?

## 2021-09-11 NOTE — Telephone Encounter (Signed)
Not sure what she is referring to. She has diverticulosis which I noted in my procedure note. Appears that she is constipated based on stool burden. I would not have seen this as she underwent colon prep prior to procedure.  In regards to her hepatic steatosis, I do not evaluate her liver during colonoscopy. If she is having issues, then please schedule office visit with an APP or myself. Thanks  ?

## 2022-01-05 ENCOUNTER — Other Ambulatory Visit: Payer: Self-pay

## 2022-01-05 ENCOUNTER — Ambulatory Visit
Admission: EM | Admit: 2022-01-05 | Discharge: 2022-01-05 | Disposition: A | Discharge status: Home / Self Care | Payer: No Typology Code available for payment source | Attending: Family Medicine | Admitting: Family Medicine

## 2022-01-05 ENCOUNTER — Encounter: Payer: Self-pay | Admitting: Emergency Medicine

## 2022-01-05 DIAGNOSIS — J029 Acute pharyngitis, unspecified: Secondary | ICD-10-CM | POA: Insufficient documentation

## 2022-01-05 DIAGNOSIS — J069 Acute upper respiratory infection, unspecified: Secondary | ICD-10-CM | POA: Insufficient documentation

## 2022-01-05 LAB — POCT RAPID STREP A (OFFICE): Rapid Strep A Screen: NEGATIVE

## 2022-01-05 MED ORDER — LIDOCAINE VISCOUS HCL 2 % MT SOLN: 10.0000 mL | OROMUCOSAL | 0 refills | Status: DC | PRN

## 2022-01-05 MED ORDER — FLUTICASONE PROPIONATE 50 MCG/ACT NA SUSP: 1.0000 | Freq: Two times a day (BID) | NASAL | 2 refills | Status: DC

## 2022-01-05 NOTE — ED Triage Notes (Signed)
Pt reports headache, cough, sore throat, nausea, and bilateral pain for last several days.

## 2022-01-05 NOTE — ED Provider Notes (Signed)
[facility]    CSN: [id number] Arrival date & time: 01/05/22  1318      History   Chief Complaint Chief Complaint  Patient presents with   Sore Throat    HPI Kayla Wilkinson is a 51 y.o. female.   Presenting today with several day history of headache, cough, sore throat, nausea, bilateral ear pressure, sinus pressure times about 3 days.  Has been having episodes of sweats and chills, has not checked for fever.  Denies chest pain, shortness of breath, abdominal pain, nausea vomiting or diarrhea.  So far trying Mucinex, DayQuil with minimal relief.  Recently exposed to strep throat at work.    Past Medical History:  Diagnosis Date   Acid reflux    Chest pain    a. Coronary CT in 08/2019 showing calcium score of 0   Diabetes mellitus    Hypertension     Patient Active Problem List   Diagnosis Date Noted   Dyspepsia 03/30/2020   Encounter for screening colonoscopy 03/30/2020   Mass of right breast on mammogram 05/26/2019   Personal history of noncompliance with medical treatment, presenting hazards to health 08/03/2017   Uncontrolled type 2 diabetes mellitus with hyperglycemia ([place]) 07/15/2017   Class 2 severe obesity due to excess calories with serious comorbidity and body mass index (BMI) of 38.0 to 38.9 in adult ([place]) 07/15/2017   Essential hypertension, benign 07/15/2017   Postop check 07/18/2016   Diabetes mellitus type 2 in obese ([place]) 05/14/2016   Genital herpes 03/31/2016   Rectocele 03/24/2016   Rotator cuff tear, left 07/28/2013   Pain in joint, shoulder region 06/16/2013   Muscle weakness (generalized) 06/16/2013   Bursitis of shoulder region 06/07/2013   Ankle fracture 08/18/2011   Ankle fracture, lateral malleolus, closed 05/20/2011    Past Surgical History:  Procedure Laterality Date   BIOPSY  10/16/2020   Procedure: BIOPSY;  Surgeon: Eloise Harman, DO;  [facility];  Service: Endoscopy;;   BREAST BIOPSY Right  06/01/2019   Procedure: EXCISIONAL BREAST BIOPSY WITH NEEDLE LOCALIZATION;  Surgeon: Virl Cagey, MD;  Location: [facility];  Service: General;  Laterality: Right;   CARDIAC CATHETERIZATION  2020   COLONOSCOPY WITH PROPOFOL N/A 10/16/2020   Procedure: COLONOSCOPY WITH PROPOFOL;  Surgeon: Eloise Harman, DO;  [facility];  Service: Endoscopy;  Laterality: N/A;  AM (diabetic)   ESOPHAGOGASTRODUODENOSCOPY (EGD) WITH PROPOFOL N/A 10/16/2020   Procedure: ESOPHAGOGASTRODUODENOSCOPY (EGD) WITH PROPOFOL;  Surgeon: Eloise Harman, DO;  [facility];  Service: Endoscopy;  Laterality: N/A;   RECTOCELE REPAIR N/A 07/08/2016   Procedure: POSTERIOR REPAIR (RECTOCELE);  Surgeon: Jonnie Kind, MD;  Location: [facility];  Service: Gynecology;  Laterality: N/A;    OB History     Gravida  3   Para  2   Term  2   Preterm      AB  1   Living         SAB  1   IAB      Ectopic      Multiple      Live Births               Home Medications    Prior to Admission medications   Medication Sig Start Date End Date Taking? Authorizing Provider  aspirin EC 81 MG tablet Take 81 mg by mouth daily.   Yes [provider]  fluticasone (FLONASE) 50 MCG/ACT nasal spray  Place 1 spray into both nostrils 2 (two) times daily. 01/05/22  Yes Volney American, PA-C  glipiZIDE (GLUCOTROL) 5 MG tablet Take 5 mg by mouth daily.    Yes [provider]  lidocaine (XYLOCAINE) 2 % solution Use as directed 10 mLs in the mouth or throat every 3 (three) hours as needed for mouth pain. 01/05/22  Yes Volney American, PA-C  losartan (COZAAR) 25 MG tablet Take 25 mg by mouth daily.   Yes [provider]  meloxicam (MOBIC) 15 MG tablet Take 1 tablet (15 mg total) by mouth daily. 01/31/21  Yes Hyatt, Max T, DPM  metFORMIN (GLUCOPHAGE) 1000 MG tablet Take 1,000 mg by mouth 2 (two) times daily with a meal.   Yes [provider]  metoprolol tartrate  (LOPRESSOR) 25 MG tablet Take 25 mg by mouth 2 (two) times daily.  03/01/19  Yes [provider]  omeprazole (PRILOSEC) 40 MG capsule Take 40 mg by mouth 2 (two) times daily before a meal. 05/20/19  Yes [provider]  rosuvastatin (CRESTOR) 10 MG tablet Take 20 mg by mouth daily.   Yes [provider]  STEGLATRO 5 MG TABS Take 5 mg by mouth every morning.  05/19/18  Yes [provider]  valACYclovir (VALTREX) 1000 MG tablet Take 1 tablet (1,000 mg total) by mouth 2 (two) times daily. X 10 days, then once daily thereafter Patient taking differently: Take 1,000 mg by mouth 2 (two) times daily. 03/24/16  Yes [name], CNM  Insulin Glargine (LANTUS SOLOSTAR) 100 UNIT/ML Solostar Pen Inject 50 Units into the skin daily at 10 pm. Patient taking differently: Inject 50 Units into the skin daily as needed (High blood sugar). 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   Review of Systems Review of Systems Per HPI  Physical Exam Triage Vital Signs ED Triage Vitals [01/05/22 1327]  Enc Vitals Group     BP 113/76     Pulse Rate (!) 116     Resp 18     Temp 98.3 F (36.8 C)     Temp Source Oral     SpO2 93 %     Weight      Height      Head Circumference      Peak Flow       Pain Score 8     Pain Loc      Pain Edu?      Excl. in [place]?    No data found.  Updated Vital Signs BP 113/76 (BP  Location: Right Arm)   Pulse (!) 116   Temp 98.3 F (36.8 C) (Oral)   Resp 18   LMP 04/05/2017   SpO2 93%   Visual Acuity Right Eye Distance:   Left Eye Distance:   Bilateral Distance:    Right Eye Near:   Left Eye Near:    Bilateral Near:     Physical Exam Vitals and nursing note reviewed.  Constitutional:      Appearance: Normal appearance.  HENT:     Head: Atraumatic.     Right Ear: Tympanic membrane and external ear normal.     Left Ear: Tympanic membrane and external ear normal.     Nose: Nose normal.     Mouth/Throat:     Mouth: Mucous membranes are moist.     Pharynx: Posterior oropharyngeal erythema present. No oropharyngeal exudate.  Eyes:     Extraocular Movements: Extraocular movements intact.     Conjunctiva/sclera: Conjunctivae normal.  Cardiovascular:     Rate and Rhythm: Normal rate and regular rhythm.     Heart sounds: Normal heart sounds.  Pulmonary:     Effort: Pulmonary effort is normal.     Breath sounds: Normal breath sounds. No wheezing or rales.  Musculoskeletal:        General: Normal range of motion.     Cervical back: Normal range of motion and neck supple.  Lymphadenopathy:     Cervical: No cervical adenopathy.  Skin:    General: Skin is warm and dry.  Neurological:     Mental Status: She is alert and oriented to person, place, and time.  Psychiatric:        Mood and Affect: Mood normal.        Thought Content: Thought content normal.      UC Treatments / Results  Labs (all labs ordered are listed, but only abnormal results are displayed) Labs Reviewed  COVID-19, FLU A+B NAA  CULTURE, GROUP A STREP [facility])  POCT RAPID STREP A (OFFICE)    EKG   Radiology No results found.  Procedures Procedures (including critical care time)  Medications Ordered in UC Medications - No data to display  Initial  Impression / Assessment and Plan / UC Course  I have reviewed the triage vital signs and the nursing notes.  Pertinent labs & imaging results that were available during my care of the patient were reviewed by me and considered in my medical decision making (see chart for details).     Mildly tachycardic in triage, otherwise vital signs reassuring.  Rapid strep negative, throat culture and COVID and flu testing pending.  Discussed supportive care with viscous lidocaine, Flonase, cold and congestion medications, sinus rinses, over-the-counter pain relievers.  Return for worsening symptoms.  Work note given.  Final Clinical Impressions(s) / UC Diagnoses   Final diagnoses:  Sore throat  Viral URI   Discharge Instructions   None    ED Prescriptions     Medication Sig Dispense Auth. Provider   lidocaine (XYLOCAINE) 2 % solution Use as directed 10 mLs in the mouth or throat every 3 (three) hours as needed for mouth pain. 100 mL Volney American, PA-C   fluticasone [facility]) 50 MCG/ACT nasal spray Place 1 spray into both nostrils 2 (two) times daily. 16 g Volney American, [place]      PDMP not reviewed this encounter.   Volney American, [place] 01/05/22 1458

## 2022-01-06 LAB — COVID-19, FLU A+B NAA
Influenza A, NAA: NOT DETECTED
Influenza B, NAA: NOT DETECTED
SARS-CoV-2, NAA: NOT DETECTED

## 2022-01-08 LAB — CULTURE, GROUP A STREP (THRC)

## 2022-02-05 ENCOUNTER — Ambulatory Visit: Payer: No Typology Code available for payment source | Admitting: Podiatry

## 2022-02-12 ENCOUNTER — Encounter: Payer: Self-pay | Admitting: Podiatry

## 2022-02-12 ENCOUNTER — Ambulatory Visit (INDEPENDENT_AMBULATORY_CARE_PROVIDER_SITE_OTHER): Payer: No Typology Code available for payment source

## 2022-02-12 ENCOUNTER — Ambulatory Visit (INDEPENDENT_AMBULATORY_CARE_PROVIDER_SITE_OTHER): Payer: No Typology Code available for payment source | Admitting: Podiatry

## 2022-02-12 DIAGNOSIS — M722 Plantar fascial fibromatosis: Secondary | ICD-10-CM

## 2022-02-12 DIAGNOSIS — M779 Enthesopathy, unspecified: Secondary | ICD-10-CM

## 2022-02-12 MED ORDER — TRIAMCINOLONE ACETONIDE 10 MG/ML IJ SUSP
20.0000 mg | Freq: Once | INTRAMUSCULAR | Status: AC
  Administered 2022-02-12: 20 mg

## 2022-02-12 MED ORDER — MELOXICAM 15 MG PO TABS: 15.0000 mg | ORAL_TABLET | Freq: Every day | ORAL | 2 refills | Status: DC

## 2022-02-12 NOTE — Progress Notes (Signed)
Subjective:   Patient ID: Kayla Wilkinson, female   DOB: 51 y.o.   MRN: [id number]   HPI Patient presents stating that her heels on both feet have been hurting her again and the tops of both feet have become very sore and she is also concerned about arthritis   ROS      Objective:  Physical Exam  Neurovascular status intact patient's not a good historian on her diabetes stating she feels like she is doing better eating better and has lost some weight but she still has history of higher A1c's.  Patient has exquisite discomfort in the plantar fascial bilateral in the heel and also is noted to have discomfort in the top of both feet around the extensor complex with inflammation     Assessment:  Acute plantar fasciitis bilateral along with extensor tendinitis     Plan:  I explained to her that I like to get her better from the acute process and then really work on it chronically and I reviewed this fact with her.  At this point I injected the plantar fascia bilateral 3 mg Kenalog 5 mg Xylocaine and I then did a small injection of the extensor complex 1.5 mg dexamethasone 1.5 mg Xylocaine advised on ice therapy placed on Mobic and reappoint 3 weeks with possibility for orthotics splints or other treatment  X-rays indicate that there is moderate depression of the arch bilateral no active arthritic process with identified small spur formation

## 2022-02-23 ENCOUNTER — Ambulatory Visit
Admission: EM | Admit: 2022-02-23 | Discharge: 2022-02-23 | Disposition: A | Discharge status: Home / Self Care | Payer: No Typology Code available for payment source | Attending: Nurse Practitioner | Admitting: Nurse Practitioner

## 2022-02-23 DIAGNOSIS — R0989 Other specified symptoms and signs involving the circulatory and respiratory systems: Secondary | ICD-10-CM | POA: Insufficient documentation

## 2022-02-23 DIAGNOSIS — Z20822 Contact with and (suspected) exposure to covid-19: Secondary | ICD-10-CM | POA: Diagnosis present

## 2022-02-23 DIAGNOSIS — B349 Viral infection, unspecified: Secondary | ICD-10-CM | POA: Diagnosis present

## 2022-02-23 LAB — POCT RAPID STREP A (OFFICE): Rapid Strep A Screen: NEGATIVE

## 2022-02-23 MED ORDER — ALBUTEROL SULFATE HFA 108 (90 BASE) MCG/ACT IN AERS: 2.0000 | INHALATION_SPRAY | Freq: Four times a day (QID) | RESPIRATORY_TRACT | 0 refills | Status: DC | PRN

## 2022-02-23 NOTE — ED Triage Notes (Signed)
Pt reports sore throat, left ear pain, congestion, shortness of breath x 3 days. OTC meds gives some relief.   Pt needs COVID test to return to work. Reports a coworker had COVID 1 week ago.

## 2022-02-23 NOTE — Discharge Instructions (Addendum)
As discussed, if the COVID results are positive, you are a candidate for molnupiravir. Take medication as prescribed. Increase fluids and allow for plenty of rest. Recommend Tylenol or ibuprofen as needed for pain, fever, or general discomfort. Warm salt water gargles 3-4 times daily to help with throat pain or discomfort. Recommend using a humidifier at bedtime during sleep to help with cough and nasal congestion. Sleep elevated on 2 pillows to help with cough. Follow up in the next 7-10 days if symptoms fail to improve.

## 2022-02-23 NOTE — ED Provider Notes (Signed)
[facility]    CSN: [id number] Arrival date & time: 02/23/22  1331      History   Chief Complaint Chief Complaint  Patient presents with   Otalgia   Sore Throat        Shortness of Breath         HPI Kayla Wilkinson is a 51 y.o. female.   The history is provided by the patient.   Patient presents for upper respiratory symptoms that been present for the past 3 days.  Patient reports that one of her coworkers recently tested positive for COVID approximately 1 week ago.  Patient states that her job is requesting a COVID test prior to her return to work.  She complains of sore throat, left ear pain, nasal congestion, and intermittent shortness of breath.  She denies fever, chills, headache, runny nose, wheezing, or GI symptoms.  Patient has been taking over-the-counter medications which have given her some relief.  Patient reports that she has received 3 COVID vaccines.  Patient reports prior history of COVID, and states that her symptoms feel similar.  Patient does have a history of diabetes and hypertension.  Patient would like an antiviral if her COVID test is positive.  Past Medical History:  Diagnosis Date   Acid reflux    Chest pain    a. Coronary CT in 08/2019 showing calcium score of 0   Diabetes mellitus    Hypertension     Patient Active Problem List   Diagnosis Date Noted   Dyspepsia 03/30/2020   Encounter for screening colonoscopy 03/30/2020   Mass of right breast on mammogram 05/26/2019   Personal history of noncompliance with medical treatment, presenting hazards to health 08/03/2017   Uncontrolled type 2 diabetes mellitus with hyperglycemia ([place]) 07/15/2017   Class 2 severe obesity due to excess calories with serious comorbidity and body mass index (BMI) of 38.0 to 38.9 in adult ([place]) 07/15/2017   Essential hypertension, benign 07/15/2017   Postop check 07/18/2016   Diabetes mellitus type 2 in obese ([place]) 05/14/2016   Genital herpes  03/31/2016   Rectocele 03/24/2016   Rotator cuff tear, left 07/28/2013   Pain in joint, shoulder region 06/16/2013   Muscle weakness (generalized) 06/16/2013   Bursitis of shoulder region 06/07/2013   Ankle fracture 08/18/2011   Ankle fracture, lateral malleolus, closed 05/20/2011    Past Surgical History:  Procedure Laterality Date   BIOPSY  10/16/2020   Procedure: BIOPSY;  Surgeon: Eloise Harman, DO;  [facility];  Service: Endoscopy;;   BREAST BIOPSY Right 06/01/2019   Procedure: EXCISIONAL BREAST BIOPSY WITH NEEDLE LOCALIZATION;  Surgeon: Virl Cagey, MD;  Location: [facility];  Service: General;  Laterality: Right;   CARDIAC CATHETERIZATION  2020   COLONOSCOPY WITH PROPOFOL N/A 10/16/2020   Procedure: COLONOSCOPY WITH PROPOFOL;  Surgeon: Eloise Harman, DO;  [facility];  Service: Endoscopy;  Laterality: N/A;  AM (diabetic)   ESOPHAGOGASTRODUODENOSCOPY (EGD) WITH PROPOFOL N/A 10/16/2020   Procedure: ESOPHAGOGASTRODUODENOSCOPY (EGD) WITH PROPOFOL;  Surgeon: Eloise Harman, DO;  [facility];  Service: Endoscopy;  Laterality: N/A;   RECTOCELE REPAIR N/A 07/08/2016   Procedure: POSTERIOR REPAIR (RECTOCELE);  Surgeon: Jonnie Kind, MD;  Location: [facility];  Service: Gynecology;  Laterality: N/A;    OB History     Gravida  3   Para  2   Term  2   Preterm      AB  1  Living         SAB  1   IAB      Ectopic      Multiple      Live Births               Home Medications    Prior to Admission medications   Medication Sig Start Date End Date Taking? Authorizing Provider  albuterol (VENTOLIN HFA) 108 (90 Base) MCG/ACT inhaler Inhale 2 puffs into the lungs every 6 (six) hours as needed for wheezing or shortness of breath. 02/23/22  Yes Cally Nygard-Warren, Kayla Lea, NP  aspirin EC 81 MG tablet Take 81 mg by mouth daily.    [provider]  fluticasone (FLONASE) 50 MCG/ACT nasal spray Place 1 spray into both  nostrils 2 (two) times daily. 01/05/22   Volney American, PA-C  glipiZIDE (GLUCOTROL) 5 MG tablet Take 5 mg by mouth daily.     [provider]  Insulin Glargine (LANTUS SOLOSTAR) 100 UNIT/ML Solostar Pen Inject 50 Units into the skin daily at 10 pm. Patient taking differently: Inject 50 Units into the skin daily as needed (High blood sugar). 07/15/17   Cassandria Anger, MD  lidocaine (XYLOCAINE) 2 % solution Use as directed 10 mLs in the mouth or throat every 3 (three) hours as needed for mouth pain. 01/05/22   Volney American, PA-C  losartan (COZAAR) 25 MG tablet Take 25 mg by mouth daily.    [provider]  meloxicam (MOBIC) 15 MG tablet Take 1 tablet (15 mg total) by mouth daily. 01/31/21   Hyatt, Max T, DPM  meloxicam (MOBIC) 15 MG tablet Take 1 tablet (15 mg total) by mouth daily. 02/12/22   Wallene Huh, DPM  metFORMIN (GLUCOPHAGE) 1000 MG tablet Take 1,000 mg by mouth 2 (two) times daily with a meal.    [provider]  metoprolol tartrate (LOPRESSOR) 25 MG tablet Take 25 mg by mouth 2 (two) times daily.  03/01/19   [provider]  omeprazole (PRILOSEC) 40 MG capsule Take 40 mg by mouth 2 (two) times daily before a meal. 05/20/19   [provider]  rosuvastatin (CRESTOR) 10 MG tablet Take 20 mg by mouth daily.    [provider]  STEGLATRO 5 MG TABS Take 5 mg by mouth every morning.  05/19/18   [provider]  valACYclovir (VALTREX) 1000 MG tablet Take 1 tablet (1,000 mg total) by mouth 2 (two) times daily. X 10 days, then once daily thereafter Patient taking differently: Take 1,000 mg by mouth 2 (two) times daily. 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   Review of Systems Review of Systems Per HPI  Physical Exam Triage Vital Signs ED Triage Vitals  Enc Vitals Group     BP 02/23/22 1449 113/65     Pulse Rate 02/23/22 1449 (!) 118     Resp 02/23/22 1449 18     Temp 02/23/22 1449 98.9 F (37.2 C)     Temp Source 02/23/22 1449 Oral     SpO2 02/23/22 1449 96 %     Weight --      Height --      Head Circumference --      Peak Flow --      Pain Score 02/23/22 1452 7     Pain Loc --      Pain Edu? --      Excl. in [place]? --    No data found.  Updated Vital Signs BP 113/65 (BP Location: Right Arm)   Pulse (!) 118   Temp 98.9 F (37.2 C) (Oral)   Resp 18   LMP 04/05/2017   SpO2 96%   Visual Acuity Right Eye Distance:   Left Eye Distance:   Bilateral Distance:    Right Eye Near:   Left Eye Near:    Bilateral Near:     Physical Exam Vitals reviewed.  Constitutional:      General: She is not in acute distress.    Appearance: She is well-developed.  HENT:     Head: Normocephalic.     Right Ear: Tympanic membrane and ear canal normal.     Left Ear: Tympanic membrane and ear canal normal.     Nose: No congestion.     Mouth/Throat:     Mouth: Mucous membranes are moist.     Pharynx: Oropharynx is clear. Uvula midline.     Tonsils: No tonsillar exudate.  Eyes:     Conjunctiva/sclera: Conjunctivae normal.     Pupils: Pupils are equal, round, and reactive to light.  Cardiovascular:     Rate and Rhythm: Normal rate and regular rhythm.     Heart sounds: Normal heart sounds.  Pulmonary:      Effort: Pulmonary effort is normal.     Breath sounds: Normal breath sounds.  Abdominal:     General: Bowel sounds are normal. There is no distension.     Palpations: Abdomen is soft.     Tenderness: There is no abdominal tenderness. There is no guarding or rebound.  Genitourinary:    Vagina: Normal. No vaginal discharge.  Musculoskeletal:     Cervical back: Normal range of motion.  Lymphadenopathy:     Cervical: No cervical adenopathy.  Skin:    General: Skin is warm and dry.     Findings: No erythema or rash.  Neurological:     General: No focal deficit present.     Mental Status: She is alert and oriented to person, place, and time.     Cranial Nerves: No cranial nerve deficit.  Psychiatric:        Mood and Affect: Mood normal.        Behavior: Behavior normal.      UC Treatments / Results  Labs (all labs ordered are listed, but only abnormal results are displayed) Labs Reviewed  SARS CORONAVIRUS 2 (TAT 6-24 HRS)  CULTURE, GROUP A STREP [facility])  POCT RAPID STREP A (OFFICE)    EKG   Radiology No results found.  Procedures Procedures (including critical care time)  Medications Ordered in UC Medications - No data to display  Initial Impression / Assessment and Plan / UC Course  I have reviewed the triage vital signs and the nursing notes.  Pertinent labs & imaging results that were available during my care of the patient were reviewed by me and considered in my medical decision making (see chart for details).  Patient presents with upper respiratory symptoms that been present for the past 3 days.  Patient with positive COVID exposure over the past week.  Rapid strep test was negative, throat culture and COVID test are pending.  Differential diagnoses include viral upper respiratory infection, acute sinusitis, and COVID.  Question with patient regarding use of antiviral if her COVID test is positive.  Patient is in agreement with the use of molnupiravir if indicated.   Patient was given an albuterol inhaler for her intermittent shortness of breath.  Supportive care recommendations were provided to the patient.  Patient was advised that if her COVID test throat culture results are positive she will be contacted.  Patient verbalizes understanding.  Patient was given a work note until her COVID results are received. Final Clinical Impressions(s) / UC Diagnoses   Final diagnoses:  Exposure to COVID-19 virus  Upper respiratory symptom  Viral illness     Discharge Instructions      As discussed, if the COVID results are positive, you are a candidate for molnupiravir. Take medication as prescribed. Increase fluids and allow for plenty of rest. Recommend Tylenol or ibuprofen as needed for pain, fever, or general discomfort. Warm salt water gargles 3-4 times daily to help with throat pain or discomfort. Recommend using a humidifier at bedtime during sleep to help with cough and nasal congestion. Sleep elevated on 2 pillows to help with cough. Follow up in the next 7-10 days if symptoms fail to improve.     ED Prescriptions     Medication Sig Dispense Auth. Provider   albuterol (VENTOLIN HFA) 108 (90 Base) MCG/ACT inhaler Inhale 2 puffs into the lungs every 6 (six) hours as needed for wheezing or shortness of breath. 8 g Herby Amick-Warren, Kayla Lea, NP      PDMP not reviewed this encounter.   Tish Men, NP 02/23/22 [phone]

## 2022-02-23 NOTE — ED Notes (Signed)
Pt did not answer the phone.

## 2022-02-25 LAB — SARS CORONAVIRUS 2 (TAT 6-24 HRS): SARS Coronavirus 2: NEGATIVE

## 2022-02-27 LAB — CULTURE, GROUP A STREP (THRC)

## 2022-03-05 ENCOUNTER — Ambulatory Visit: Payer: No Typology Code available for payment source | Admitting: Podiatry

## 2022-03-21 ENCOUNTER — Other Ambulatory Visit: Payer: Self-pay | Admitting: Family Medicine

## 2022-03-21 ENCOUNTER — Other Ambulatory Visit (HOSPITAL_COMMUNITY): Payer: Self-pay | Admitting: Family Medicine

## 2022-03-21 DIAGNOSIS — M25561 Pain in right knee: Secondary | ICD-10-CM

## 2022-04-10 ENCOUNTER — Ambulatory Visit (HOSPITAL_COMMUNITY): Payer: No Typology Code available for payment source | Attending: Family Medicine

## 2022-04-10 ENCOUNTER — Encounter (HOSPITAL_COMMUNITY): Payer: Self-pay

## 2022-04-20 ENCOUNTER — Ambulatory Visit (HOSPITAL_COMMUNITY)
Admission: RE | Admit: 2022-04-20 | Discharge: 2022-04-20 | Disposition: A | Discharge status: Home / Self Care | Payer: No Typology Code available for payment source | Source: Ambulatory Visit | Attending: Family Medicine | Admitting: Family Medicine

## 2022-04-20 DIAGNOSIS — M25561 Pain in right knee: Secondary | ICD-10-CM | POA: Insufficient documentation

## 2022-05-20 DIAGNOSIS — S83419A Sprain of medial collateral ligament of unspecified knee, initial encounter: Secondary | ICD-10-CM | POA: Insufficient documentation

## 2022-05-20 DIAGNOSIS — M1990 Unspecified osteoarthritis, unspecified site: Secondary | ICD-10-CM | POA: Insufficient documentation

## 2022-05-26 IMAGING — CT CT ABDOMEN W/O CM
2 of 7 series · 14 of 46 positions shown, 16 images · non-contrast
Comparison: Abdominal ultrasound January 12, 2015

CLINICAL DATA: 1.5 years of right upper quadrant pain with nausea.
Pain is worse postprandial

EXAM:
CT ABDOMEN WITHOUT CONTRAST
TECHNIQUE: Multidetector CT imaging of the abdomen was performed following the
standard protocol without IV contrast.
RADIATION DOSE REDUCTION: This exam was performed according to the
departmental dose-optimization program which includes automated
exposure control, adjustment of the mA and/or kV according to
patient size and/or use of iterative reconstruction technique.

[Series 3: axial st · axial · 0.81mm/px · z∈[+1372,+1568]mm · 11 of 47 slices shown, 13 images]
[im 4/47  soft-tissue]
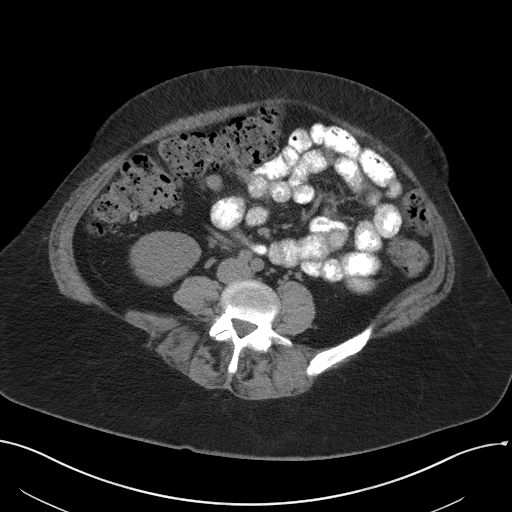
[im 4/47  bone]
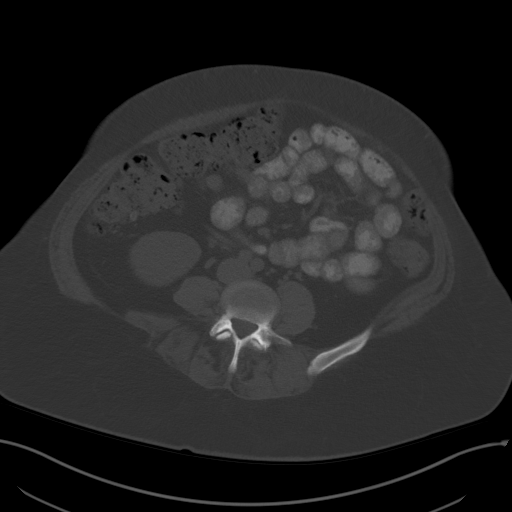
[im 8/47  soft-tissue]
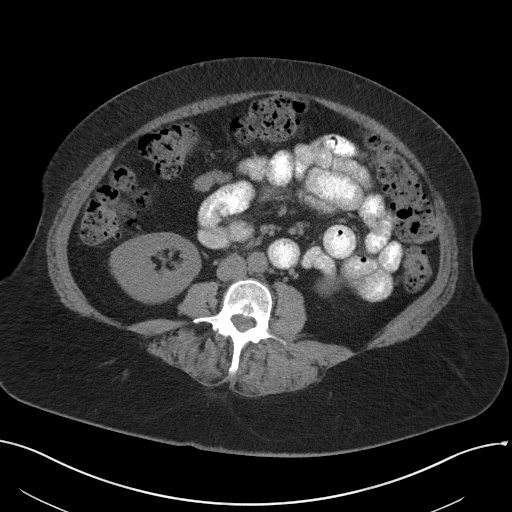
[im 12/47  soft-tissue]
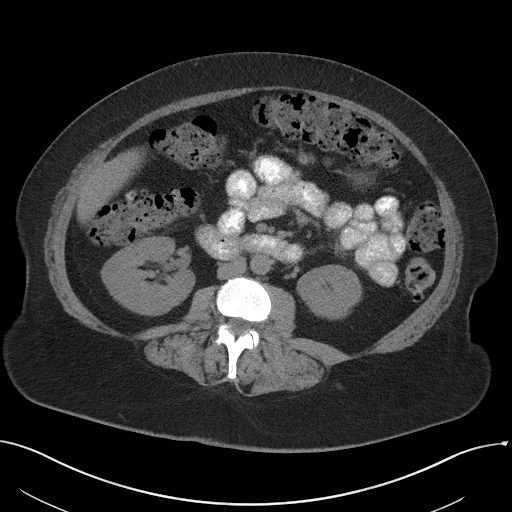
[im 16/47  soft-tissue]
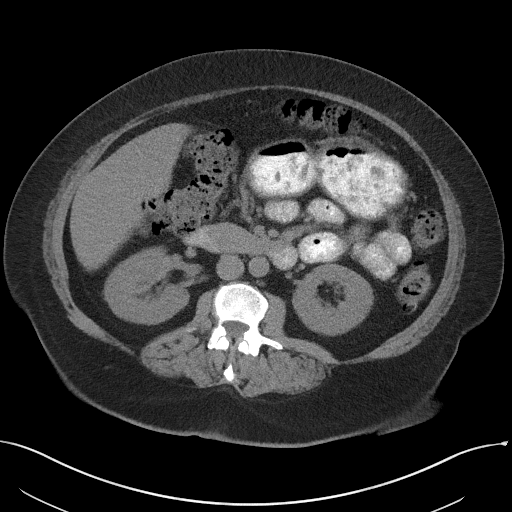
[im 20/47  soft-tissue]
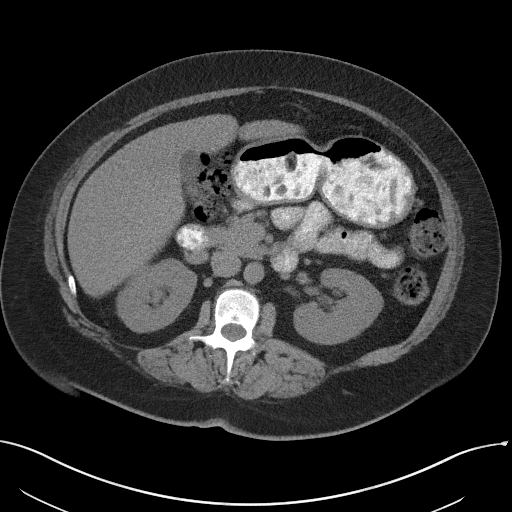
[im 24/47  soft-tissue]
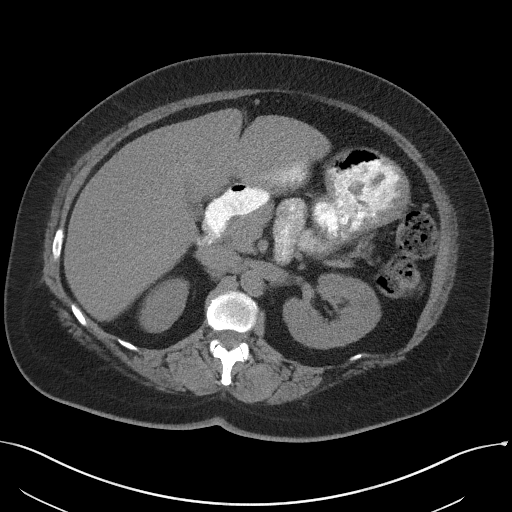
[im 27/47  soft-tissue]
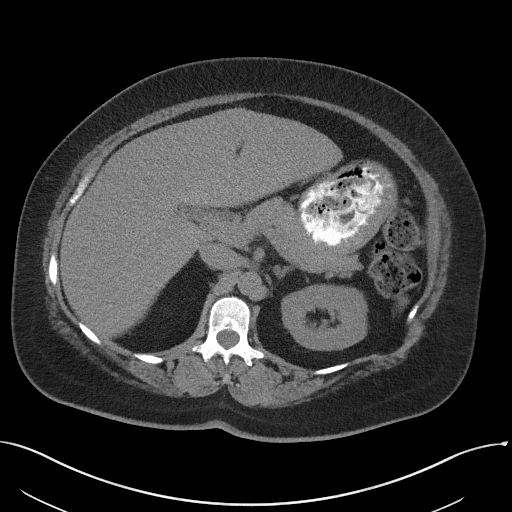
[im 31/47  soft-tissue]
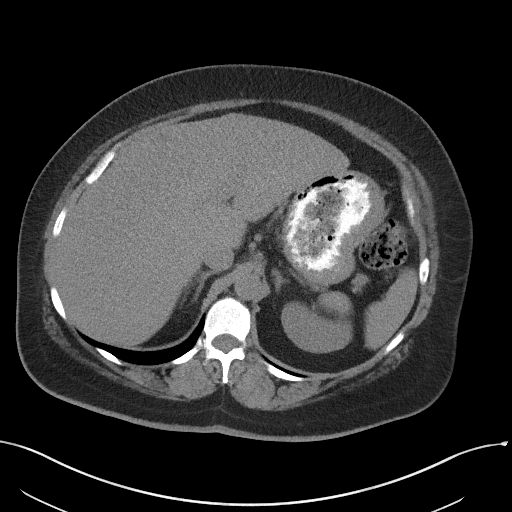
[im 35/47  soft-tissue]
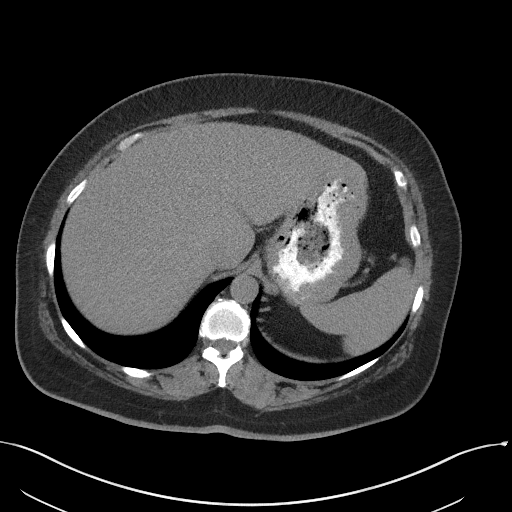
[im 35/47  bone]
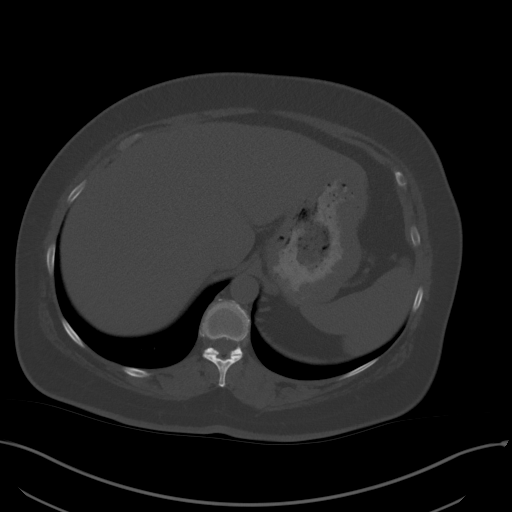
[im 39/47  soft-tissue]
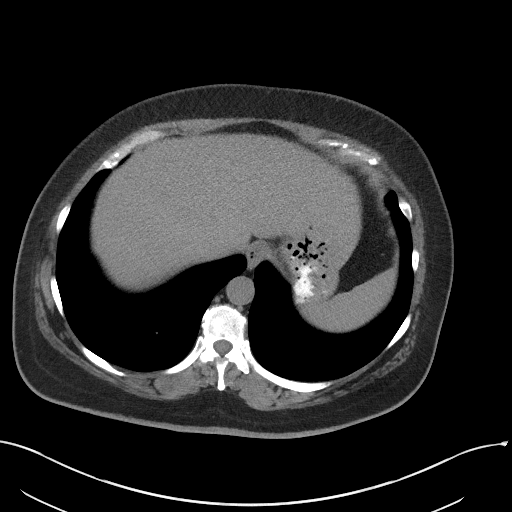
[im 43/47  soft-tissue]
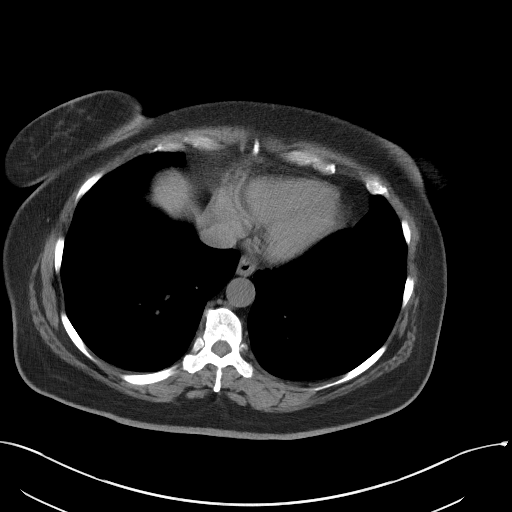

[Series 6: coronal st · coronal · 0.46mm/px · 3 of 115 slices shown]
[im 29/115  soft-tissue]
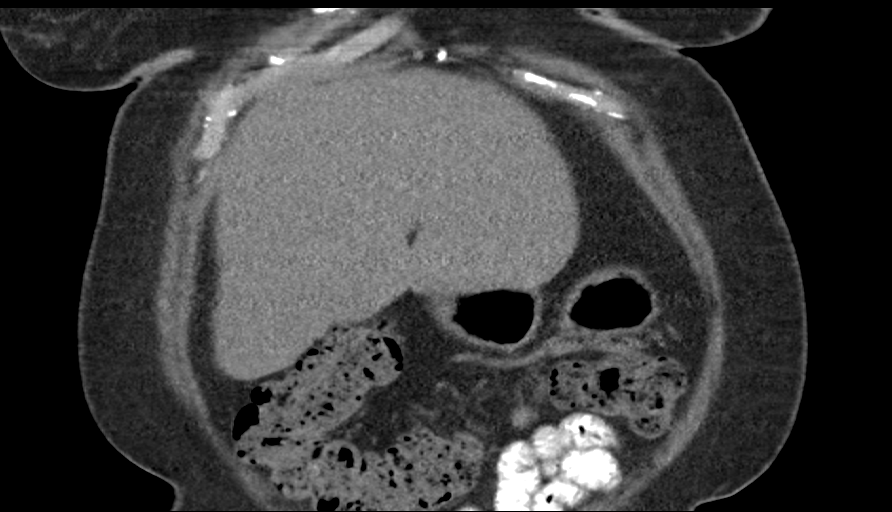
[im 58/115  soft-tissue]
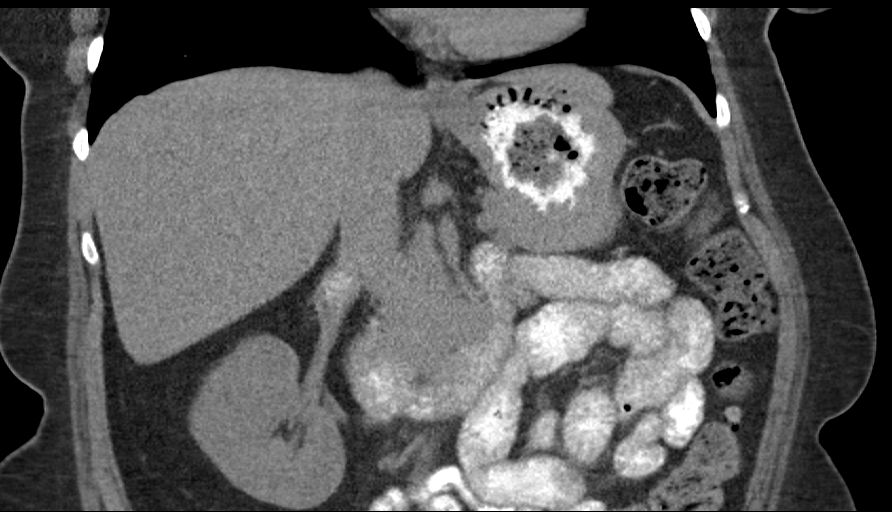
[im 86/115  soft-tissue]
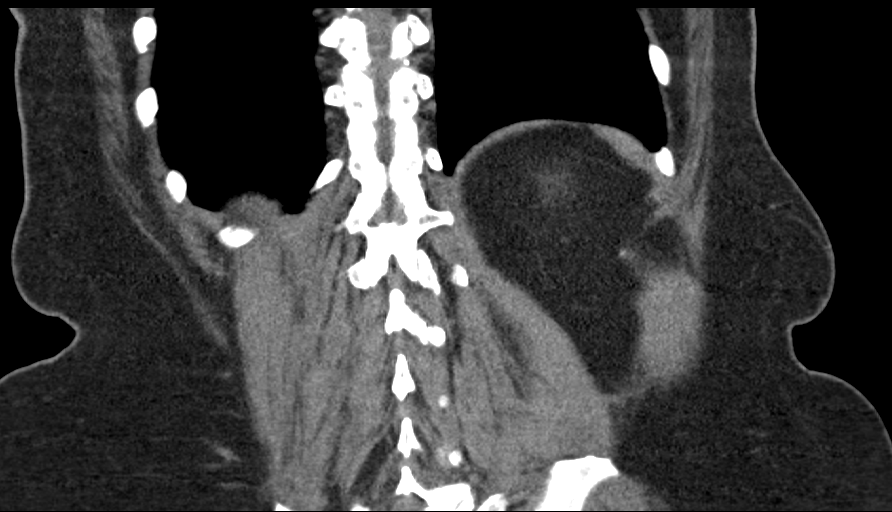

[14 of 46 positions shown; findings below may reference images not displayed]

FINDINGS: Lower chest: No acute abnormality.

Hepatobiliary: Hepatic steatosis. No suspicious hepatic lesion on
this noncontrast examination. Gallbladder is unremarkable. No
biliary ductal dilation.

Pancreas: No pancreatic ductal dilation or evidence of acute
inflammation.

Spleen: No splenomegaly or focal splenic lesion.

Adrenals/Urinary Tract: Bilateral adrenal glands appear normal. No
hydronephrosis. No nephrolithiasis. No contour deforming renal mass.

Stomach/Bowel: Radiopaque enteric contrast material traverses distal
loops of small bowel. No pathologic dilation or evidence of acute
inflammation involving loops of large or small bowel in the abdomen.
Large stool burden in the visualized colon. Colonic diverticulosis
without findings of acute diverticulitis.

Vascular/Lymphatic: Normal caliber abdominal aorta. No
pathologically enlarged abdominal lymph nodes.

Other: No significant abdominal free fluid

Musculoskeletal: No acute or significant osseous findings.
IMPRESSION: 1. Hepatic steatosis.
2. Colonic diverticulosis without findings of acute diverticulitis.
3. Large stool burden in the visualized colon.

## 2022-10-31 ENCOUNTER — Emergency Department (HOSPITAL_COMMUNITY)
Admission: EM | Admit: 2022-10-31 | Discharge: 2022-10-31 | Disposition: A | Discharge status: Home / Self Care | Payer: No Typology Code available for payment source | Attending: Emergency Medicine | Admitting: Emergency Medicine

## 2022-10-31 ENCOUNTER — Other Ambulatory Visit: Payer: Self-pay

## 2022-10-31 ENCOUNTER — Encounter (HOSPITAL_COMMUNITY): Payer: Self-pay | Admitting: Emergency Medicine

## 2022-10-31 DIAGNOSIS — E119 Type 2 diabetes mellitus without complications: Secondary | ICD-10-CM | POA: Insufficient documentation

## 2022-10-31 DIAGNOSIS — K029 Dental caries, unspecified: Secondary | ICD-10-CM | POA: Insufficient documentation

## 2022-10-31 DIAGNOSIS — K0889 Other specified disorders of teeth and supporting structures: Secondary | ICD-10-CM | POA: Diagnosis present

## 2022-10-31 DIAGNOSIS — Z79899 Other long term (current) drug therapy: Secondary | ICD-10-CM | POA: Insufficient documentation

## 2022-10-31 DIAGNOSIS — I10 Essential (primary) hypertension: Secondary | ICD-10-CM | POA: Diagnosis not present

## 2022-10-31 MED ORDER — AMOXICILLIN-POT CLAVULANATE 875-125 MG PO TABS
1.0000 | ORAL_TABLET | Freq: Once | ORAL | Status: AC
  Administered 2022-10-31: 1 via ORAL
  Filled 2022-10-31: qty 1

## 2022-10-31 MED ORDER — AMOXICILLIN-POT CLAVULANATE 875-125 MG PO TABS: 1.0000 | ORAL_TABLET | Freq: Two times a day (BID) | ORAL | 0 refills | Status: AC

## 2022-10-31 MED ORDER — KETOROLAC TROMETHAMINE 15 MG/ML IJ SOLN
15.0000 mg | Freq: Once | INTRAMUSCULAR | Status: AC
  Administered 2022-10-31: 15 mg via INTRAMUSCULAR
  Filled 2022-10-31: qty 1

## 2022-10-31 MED ORDER — IBUPROFEN 600 MG PO TABS: 600.0000 mg | ORAL_TABLET | Freq: Three times a day (TID) | ORAL | 0 refills | Status: AC | PRN

## 2022-10-31 NOTE — Discharge Instructions (Signed)
Thank you for letting us take care of you today.  We gave you your first dose of antibiotics and a dose of pain medication to help with your symptoms.  Please follow-up with your dentist as scheduled on Monday.  If you would like a different dentist to follow-up with, I have provided the dentist on-call tonight.  Please call their office within 48 hours to schedule follow-up if needed.  Continue to take the antibiotics as prescribed.  Discuss at your dental appointment if you should continue these antibiotics or stop them.  You may take ibuprofen as prescribed over the next few days to help with symptoms.  As the infection clears which is the indication of the antibiotics, your pain should significantly improve and resolve.  In addition to the ibuprofen, you may use over-the-counter Tylenol to help with pain control.  He will most likely benefit from combining Tylenol and ibuprofen to get better pain relief.  For any new or worsening symptoms such as trouble breathing, trouble swallowing, difficulty with movement of your jaw, chest pain, or other new, concerning symptoms, please return to the nearest emergency department for reevaluation.

## 2022-10-31 NOTE — ED Provider Notes (Signed)
[place] [facility]MENT AT [facility] Provider Note   CSN: [id number] Arrival date & time: 10/31/22  2106     History {Add pertinent medical, surgical, social history, OB history to HPI:1} Chief Complaint  Patient presents with   Dental Pain    Kayla Wilkinson is a 52 y.o. female      Home Medications Prior to Admission medications   Medication Sig Start Date End Date Taking? Authorizing Provider  amoxicillin-clavulanate (AUGMENTIN) 875-125 MG tablet Take 1 tablet by mouth every 12 (twelve) hours for 5 days. 10/31/22 11/05/22 Yes Xitlalic Maslin L, PA-C  ibuprofen (ADVIL) 600 MG tablet Take 1 tablet (600 mg total) by mouth every 8 (eight) hours as needed for up to 3 days for mild pain or moderate pain. 10/31/22 11/03/22 Yes Aliah Eriksson L, PA-C  albuterol (VENTOLIN HFA) 108 (90 Base) MCG/ACT inhaler Inhale 2 puffs into the lungs every 6 (six) hours as needed for wheezing or shortness of breath. 02/23/22   Leath-Warren, Sadie Haber, NP  aspirin EC 81 MG tablet Take 81 mg by mouth daily.    [provider]  fluticasone (FLONASE) 50 MCG/ACT nasal spray Place 1 spray into both nostrils 2 (two) times daily. 01/05/22   Particia Nearing, PA-C  glipiZIDE (GLUCOTROL) 5 MG tablet Take 5 mg by mouth daily.     [provider]  Insulin Glargine (LANTUS SOLOSTAR) 100 UNIT/ML Solostar Pen Inject 50 Units into the skin daily at 10 pm. Patient taking differently: Inject 50 Units into the skin daily as needed (High blood sugar). 07/15/17   Roma Kayser, MD  lidocaine (XYLOCAINE) 2 % solution Use as directed 10 mLs in the mouth or throat every 3 (three) hours as needed for mouth pain. 01/05/22   Particia Nearing, PA-C  losartan (COZAAR) 25 MG tablet Take 25 mg by mouth daily.    [provider]  meloxicam (MOBIC) 15 MG tablet Take 1 tablet (15 mg total) by mouth daily. 01/31/21   Hyatt, Max T, DPM  meloxicam (MOBIC) 15 MG tablet Take 1  tablet (15 mg total) by mouth daily. 02/12/22   Lenn Sink, DPM  metFORMIN (GLUCOPHAGE) 1000 MG tablet Take 1,000 mg by mouth 2 (two) times daily with a meal.    [provider]  metoprolol tartrate (LOPRESSOR) 25 MG tablet Take 25 mg by mouth 2 (two) times daily.  03/01/19   [provider]  omeprazole (PRILOSEC) 40 MG capsule Take 40 mg by mouth 2 (two) times daily before a meal. 05/20/19   [provider]  rosuvastatin (CRESTOR) 10 MG tablet Take 20 mg by mouth daily.    [provider]  STEGLATRO 5 MG TABS Take 5 mg by mouth every morning.  05/19/18   [provider]  valACYclovir (VALTREX) 1000 MG tablet Take 1 tablet (1,000 mg total) by mouth 2 (two) times daily. X 10 days, then once daily thereafter Patient taking differently: Take 1,000 mg by mouth 2 (two) times daily. 03/24/16   Cheral Marker, CNM      Allergies    Patient has no known allergies.    Review of Systems   Review of Systems  Physical Exam Updated Vital Signs BP 138/85 (BP Location: Right Arm)   Pulse (!) 110   Temp 98.9 F (37.2 C) (Oral)   Resp 18   Ht 5\' 3"  (1.6 m)   Wt 88.5 kg   LMP 04/05/2017   SpO2 96%  BMI 34.54 kg/m  Physical Exam  ED Results / Procedures / Treatments   Labs (all labs ordered are listed, but only abnormal results are displayed) Labs Reviewed - No data to display  EKG None  Radiology No results found.  Procedures Procedures    Medications Ordered in ED Medications  ketorolac (TORADOL) 15 MG/ML injection 15 mg (has no administration in time range)  amoxicillin-clavulanate (AUGMENTIN) 875-125 MG per tablet 1 tablet (has no administration in time range)    ED Course/ Medical Decision Making/ A&P                             Medical Decision Making Risk Prescription drug management.   Medical Decision Making:   Kayla Wilkinson is a 52 y.o. female who presented to the ED today with dental pain detailed  above.    Complete initial physical exam performed, notably the patient was HDS in no acute distress. No ***obvious intraoral lesions or swelling. Poor dentition diffusely    Reviewed and confirmed nursing documentation for past medical history, family history, social history.    Initial Assessment:   With the patient's presentation of dental pain, most likely diagnosis is reversible vs irreversible pulpitis. Other diagnoses were considered including (but not limited to) ludwig's angina, osteitis, dental abscess, PTA, RPA. These are considered less likely due to history of present illness and physical exam findings.   This is most consistent with an acute complicated illness  Initial Plan:  Symptomatic management with NSAIDS/Tylenol Due to clinical overlap with potentially reversible pulpitis, antibiotics prescribed Patient will need definitive management with dentistry. Provided low cost/local dental resource chart provided by social work.  Disposition:  I have considered need for hospitalization, however, considering all of the above, I believe this patient is stable for discharge at this time.  Patient/family educated about specific return precautions for given chief complaint and symptoms.  Patient/family educated about follow-up with PCP and dentistry.    Patient/family expressed understanding of return precautions and need for follow-up. Patient spoken to regarding all imaging and laboratory results and appropriate follow up for these results. All education provided in verbal form with additional information in written form. Time was allowed for answering of patient questions. Patient discharged.              Final Clinical Impression(s) / ED Diagnoses Final diagnoses:  Dentalgia  Dental caries    Rx / DC Orders ED Discharge Orders          Ordered    amoxicillin-clavulanate (AUGMENTIN) 875-125 MG tablet  Every 12 hours        10/31/22 2300    ibuprofen (ADVIL) 600 MG  tablet  Every 8 hours PRN        10/31/22 2300

## 2022-10-31 NOTE — ED Triage Notes (Addendum)
Pt with rt  and lt upper dental pain. States dentist cannot see her until Monday and pt states that "she cannot wait until then for pain management".

## 2022-12-27 ENCOUNTER — Ambulatory Visit
Admission: EM | Admit: 2022-12-27 | Discharge: 2022-12-27 | Disposition: A | Discharge status: Home / Self Care | Payer: No Typology Code available for payment source | Attending: Nurse Practitioner | Admitting: Nurse Practitioner

## 2022-12-27 DIAGNOSIS — J309 Allergic rhinitis, unspecified: Secondary | ICD-10-CM

## 2022-12-27 DIAGNOSIS — H65193 Other acute nonsuppurative otitis media, bilateral: Secondary | ICD-10-CM

## 2022-12-27 MED ORDER — PSEUDOEPHEDRINE HCL 30 MG PO TABS: 30.0000 mg | ORAL_TABLET | Freq: Two times a day (BID) | ORAL | 0 refills | Status: DC

## 2022-12-27 MED ORDER — CETIRIZINE HCL 10 MG PO TABS: 10.0000 mg | ORAL_TABLET | Freq: Every day | ORAL | 0 refills | Status: DC

## 2022-12-27 MED ORDER — FLUTICASONE PROPIONATE 50 MCG/ACT NA SUSP: 2.0000 | Freq: Every day | NASAL | 0 refills | Status: DC

## 2022-12-27 NOTE — ED Triage Notes (Addendum)
Pt c/o ear pain pt states there is something is in her ear her left ear is bleeding, pressure. Itching headaches.   Pt states she woke up 2 days ago feeling movement in her right ear. She tried to wash it out but nothing came out.

## 2022-12-27 NOTE — ED Provider Notes (Signed)
[facility]    CSN: [id number] Arrival date & time: 12/27/22  1346      History   Chief Complaint No chief complaint on file.   HPI Kayla Wilkinson is a 52 y.o. female.   The history is provided by the patient.   The patient presents for complaints of bilateral ear pressure that has been present for the past week.  Patient states she feels like there is something in her ears.  She complains of a "popping and cracking noise".  Patient also complains of itching in her ears, and headaches.  Patient also complains of postnasal drainage and scratchy throat.  She denies fever, chills, nasal congestion, runny nose, cough, abdominal pain, nausea, vomiting, or diarrhea.  Patient states she experienced some bleeding from the left ear approximately 2 weeks ago, and was concerned that there may be something in the ear.  States that she has been taking ibuprofen for her symptoms with some relief.  Past Medical History:  Diagnosis Date   Acid reflux    Chest pain    a. Coronary CT in 08/2019 showing calcium score of 0   Diabetes mellitus    Hypertension     Patient Active Problem List   Diagnosis Date Noted   Dyspepsia 03/30/2020   Encounter for screening colonoscopy 03/30/2020   Mass of right breast on mammogram 05/26/2019   Personal history of noncompliance with medical treatment, presenting hazards to health 08/03/2017   Uncontrolled type 2 diabetes mellitus with hyperglycemia (HCC) 07/15/2017   Class 2 severe obesity due to excess calories with serious comorbidity and body mass index (BMI) of 38.0 to 38.9 in adult (HCC) 07/15/2017   Essential hypertension, benign 07/15/2017   Postop check 07/18/2016   Diabetes mellitus type 2 in obese 05/14/2016   Genital herpes 03/31/2016   Rectocele 03/24/2016   Rotator cuff tear, left 07/28/2013   Pain in joint, shoulder region 06/16/2013   Muscle weakness (generalized) 06/16/2013   Bursitis of shoulder region 06/07/2013    Ankle fracture 08/18/2011   Ankle fracture, lateral malleolus, closed 05/20/2011    Past Surgical History:  Procedure Laterality Date   BIOPSY  10/16/2020   Procedure: BIOPSY;  Surgeon: Lanelle Bal, DO;  Location: [facility];  Service: Endoscopy;;   BREAST BIOPSY Right 06/01/2019   Procedure: EXCISIONAL BREAST BIOPSY WITH NEEDLE LOCALIZATION;  Surgeon: Lucretia Roers, MD;  Location: [facility];  Service: General;  Laterality: Right;   CARDIAC CATHETERIZATION  2020   COLONOSCOPY WITH PROPOFOL N/A 10/16/2020   Procedure: COLONOSCOPY WITH PROPOFOL;  Surgeon: Lanelle Bal, DO;  Location: [facility];  Service: Endoscopy;  Laterality: N/A;  AM (diabetic)   ESOPHAGOGASTRODUODENOSCOPY (EGD) WITH PROPOFOL N/A 10/16/2020   Procedure: ESOPHAGOGASTRODUODENOSCOPY (EGD) WITH PROPOFOL;  Surgeon: Lanelle Bal, DO;  Location: [facility];  Service: Endoscopy;  Laterality: N/A;   RECTOCELE REPAIR N/A 07/08/2016   Procedure: POSTERIOR REPAIR (RECTOCELE);  Surgeon: Tilda Burrow, MD;  Location: [facility];  Service: Gynecology;  Laterality: N/A;    OB History     Gravida  3   Para  2   Term  2   Preterm      AB  1   Living         SAB  1   IAB      Ectopic      Multiple      Live Births  Home Medications    Prior to Admission medications   Medication Sig Start Date End Date Taking? Authorizing Provider  cetirizine (ZYRTEC) 10 MG tablet Take 1 tablet (10 mg total) by mouth daily. 12/27/22  Yes Telesforo Brosnahan-Warren, Sadie Haber, NP  fluticasone (FLONASE) 50 MCG/ACT nasal spray Place 2 sprays into both nostrils daily. 12/27/22  Yes Mistina Coatney-Warren, Sadie Haber, NP  pseudoephedrine (SUDAFED) 30 MG tablet Take 1 tablet (30 mg total) by mouth 2 (two) times daily. 12/27/22  Yes Edison Nicholson-Warren, Sadie Haber, NP  albuterol (VENTOLIN HFA) 108 (90 Base) MCG/ACT inhaler Inhale 2 puffs into the lungs every 6 (six) hours as needed for wheezing or shortness of breath. 02/23/22    Yeimi Debnam-Warren, Sadie Haber, NP  aspirin EC 81 MG tablet Take 81 mg by mouth daily.    [provider]  glipiZIDE (GLUCOTROL) 5 MG tablet Take 5 mg by mouth daily.     [provider]  Insulin Glargine (LANTUS SOLOSTAR) 100 UNIT/ML Solostar Pen Inject 50 Units into the skin daily at 10 pm. Patient taking differently: Inject 50 Units into the skin daily as needed (High blood sugar). 07/15/17   Roma Kayser, MD  lidocaine (XYLOCAINE) 2 % solution Use as directed 10 mLs in the mouth or throat every 3 (three) hours as needed for mouth pain. 01/05/22   Particia Nearing, PA-C  losartan (COZAAR) 25 MG tablet Take 25 mg by mouth daily.    [provider]  meloxicam (MOBIC) 15 MG tablet Take 1 tablet (15 mg total) by mouth daily. 01/31/21   Hyatt, Max T, DPM  meloxicam (MOBIC) 15 MG tablet Take 1 tablet (15 mg total) by mouth daily. 02/12/22   Lenn Sink, DPM  metFORMIN (GLUCOPHAGE) 1000 MG tablet Take 1,000 mg by mouth 2 (two) times daily with a meal.    [provider]  metoprolol tartrate (LOPRESSOR) 25 MG tablet Take 25 mg by mouth 2 (two) times daily.  03/01/19   [provider]  omeprazole (PRILOSEC) 40 MG capsule Take 40 mg by mouth 2 (two) times daily before a meal. 05/20/19   [provider]  rosuvastatin (CRESTOR) 10 MG tablet Take 20 mg by mouth daily.    [provider]  STEGLATRO 5 MG TABS Take 5 mg by mouth every morning.  05/19/18   [provider]  valACYclovir (VALTREX) 1000 MG tablet Take 1 tablet (1,000 mg total) by mouth 2 (two) times daily. X 10 days, then once daily thereafter Patient taking differently: Take 1,000 mg by mouth 2 (two) times daily. 03/24/16   Cheral Marker, CNM    Family History Family History  Problem Relation Age of Onset   Diabetes Mother    Diabetes Maternal Grandmother    Cancer Other    Cancer - Cervical Maternal Aunt    Diabetes Son    Breast cancer Other    Colon cancer  Neg Hx    Colon polyps Neg Hx     Social History Social History   Tobacco Use   Smoking status: Former    Packs/day: 0.25    Years: 1.50    Additional pack years: 0.00    Total pack years: 0.38    Types: Cigarettes    Quit date: 07/02/2012    Years since quitting: 10.4   Smokeless tobacco: Never  Vaping Use   Vaping Use: Never used  Substance Use Topics   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   Review of Systems Review of Systems Per HPI  Physical Exam Triage Vital Signs ED Triage Vitals  Enc Vitals Group     BP 12/27/22 1351 124/80     Pulse Rate 12/27/22 1351 (!) 102     Resp 12/27/22 1351 15     Temp 12/27/22 1351 98.2 F (36.8 C)     Temp Source 12/27/22 1351 Oral     SpO2 12/27/22 1351 92 %     Weight --      Height --      Head Circumference --      Peak Flow --      Pain Score 12/27/22 1354 10     Pain Loc --      Pain Edu? --      Excl. in GC? --    No data found.  Updated Vital Signs BP 124/80 (BP Location: Right Arm)   Pulse (!) 102   Temp 98.2 F (36.8 C) (Oral)   Resp 15   LMP 04/05/2017   SpO2 92%   Visual Acuity Right Eye Distance:   Left Eye Distance:   Bilateral Distance:    Right Eye Near:   Left Eye Near:    Bilateral Near:     Physical Exam Vitals and nursing note reviewed.  Constitutional:      General: She is not in acute distress.    Appearance: Normal appearance.  HENT:     Head: Normocephalic.     Right Ear: Ear canal and external ear normal. A middle ear effusion is present.     Left Ear: Ear canal and external ear normal. A middle ear effusion is present.     Nose: Congestion present. No rhinorrhea.     Right Turbinates: Enlarged and swollen.     Left Turbinates: Enlarged and swollen.     Right Sinus: No maxillary sinus tenderness or frontal sinus tenderness.     Left Sinus: No maxillary sinus tenderness or frontal sinus  tenderness.     Mouth/Throat:     Lips: Pink.     Mouth: Mucous membranes are moist.     Pharynx: Oropharynx is clear. Uvula midline. Posterior oropharyngeal erythema present. No pharyngeal swelling or oropharyngeal exudate.     Comments: Cobblestoning present to posterior oropharynx Eyes:     Extraocular Movements: Extraocular movements intact.     Conjunctiva/sclera: Conjunctivae normal.     Pupils: Pupils are equal, round, and reactive to light.  Cardiovascular:     Rate and Rhythm: Regular rhythm. Tachycardia present.     Pulses: Normal pulses.     Heart sounds: Normal heart sounds.  Pulmonary:     Effort: Pulmonary effort is normal. No respiratory distress.     Breath sounds: Normal breath sounds. No stridor. No wheezing, rhonchi or rales.  Abdominal:     General: Bowel sounds are normal.     Palpations: Abdomen is soft.     Tenderness: There is no abdominal tenderness.  Musculoskeletal:     Cervical back: Normal range of motion.  Lymphadenopathy:     Cervical: No cervical adenopathy.  Skin:    General: Skin is warm and dry.  Neurological:     General: No focal deficit present.     Mental Status: She is alert and oriented to person, place, and time.  Psychiatric:        Mood and Affect:  Mood normal.        Behavior: Behavior normal.      UC Treatments / Results  Labs (all labs ordered are listed, but only abnormal results are displayed) Labs Reviewed - No data to display  EKG   Radiology No results found.  Procedures Procedures (including critical care time)  Medications Ordered in UC Medications - No data to display  Initial Impression / Assessment and Plan / UC Course  I have reviewed the triage vital signs and the nursing notes.  Pertinent labs & imaging results that were available during my care of the patient were reviewed by me and considered in my medical decision making (see chart for details).  The patient is well-appearing, she is in no acute  distress, vital signs are stable.  Symptoms appear to be consistent with allergic rhinitis and bilateral middle ear effusions.  Will start patient on fluticasone 50 mcg nasal spray for her middle ear effusions along with cetirizine 10 mg for the allergic rhinitis and pseudoephedrine 30 mg twice daily for the ear infections and congestion.  Supportive care recommendations were provided and discussed with the patient to include continuing over-the-counter analgesics for pain or discomfort and normal saline nasal spray for nasal congestion and runny nose.  Patient was advised that if symptoms do not improve, to follow-up in this clinic or with her primary care physician for further evaluation.  Patient is in agreement with this plan of care and verbalizes understanding.  All questions were answered.  Patient stable for discharge.  Work note was provided.   Final Clinical Impressions(s) / UC Diagnoses   Final diagnoses:  Allergic rhinitis, unspecified seasonality, unspecified trigger  Acute middle ear effusion, bilateral     Discharge Instructions      Take medication as prescribed. May continue ibuprofen or Tylenol as needed for pain or discomfort. May use normal saline nasal spray throughout the day to help with nasal congestion and runny nose. Avoid getting water inside of the ears while symptoms persist. If symptoms do not improve with this treatment, you may follow-up in this clinic or with your primary care physician for further evaluation. Follow-up as needed.     ED Prescriptions     Medication Sig Dispense Auth. Provider   fluticasone (FLONASE) 50 MCG/ACT nasal spray Place 2 sprays into both nostrils daily. 16 g Toshio Slusher-Warren, Sadie Haber, NP   pseudoephedrine (SUDAFED) 30 MG tablet Take 1 tablet (30 mg total) by mouth 2 (two) times daily. 30 tablet Dorsey Charette-Warren, Sadie Haber, NP   cetirizine (ZYRTEC) 10 MG tablet Take 1 tablet (10 mg total) by mouth daily. 30 tablet Cienna Dumais-Warren,  Sadie Haber, NP      PDMP not reviewed this encounter.   Abran Cantor, NP 12/27/22 1457

## 2022-12-27 NOTE — Discharge Instructions (Signed)
Take medication as prescribed. May continue ibuprofen or Tylenol as needed for pain or discomfort. May use normal saline nasal spray throughout the day to help with nasal congestion and runny nose. Avoid getting water inside of the ears while symptoms persist. If symptoms do not improve with this treatment, you may follow-up in this clinic or with your primary care physician for further evaluation. Follow-up as needed.

## 2023-02-06 ENCOUNTER — Emergency Department (HOSPITAL_COMMUNITY)
Admission: EM | Admit: 2023-02-06 | Discharge: 2023-02-07 | Disposition: A | Discharge status: Home / Self Care | Payer: No Typology Code available for payment source | Attending: Emergency Medicine | Admitting: Emergency Medicine

## 2023-02-06 ENCOUNTER — Encounter (HOSPITAL_COMMUNITY): Payer: Self-pay

## 2023-02-06 ENCOUNTER — Emergency Department (HOSPITAL_COMMUNITY): Payer: No Typology Code available for payment source

## 2023-02-06 ENCOUNTER — Other Ambulatory Visit: Payer: Self-pay

## 2023-02-06 DIAGNOSIS — E119 Type 2 diabetes mellitus without complications: Secondary | ICD-10-CM | POA: Insufficient documentation

## 2023-02-06 DIAGNOSIS — Z1152 Encounter for screening for COVID-19: Secondary | ICD-10-CM | POA: Insufficient documentation

## 2023-02-06 DIAGNOSIS — I10 Essential (primary) hypertension: Secondary | ICD-10-CM | POA: Insufficient documentation

## 2023-02-06 DIAGNOSIS — R059 Cough, unspecified: Secondary | ICD-10-CM | POA: Diagnosis present

## 2023-02-06 DIAGNOSIS — R051 Acute cough: Secondary | ICD-10-CM | POA: Diagnosis not present

## 2023-02-06 LAB — RESP PANEL BY RT-PCR (RSV, FLU A&B, COVID)  RVPGX2
Influenza A by PCR: NEGATIVE
Influenza B by PCR: NEGATIVE
Resp Syncytial Virus by PCR: NEGATIVE
SARS Coronavirus 2 by RT PCR: NEGATIVE

## 2023-02-06 NOTE — ED Triage Notes (Signed)
Cough, nasal congestion and HA  Seen at urgent care 7/19 Recd omicef, benzonate, and prednisone and does not feel better  PCP told pt to come to ED Pt talking fast, denies SOB and CP

## 2023-02-07 MED ORDER — GUAIFENESIN ER 600 MG PO TB12: 600.0000 mg | ORAL_TABLET | Freq: Two times a day (BID) | ORAL | 0 refills | Status: DC | PRN

## 2023-02-07 MED ORDER — AZITHROMYCIN 250 MG PO TABS: ORAL_TABLET | ORAL | 0 refills | Status: DC

## 2023-02-07 MED ORDER — NAPROXEN 500 MG PO TABS: 500.0000 mg | ORAL_TABLET | Freq: Two times a day (BID) | ORAL | 0 refills | Status: DC

## 2023-02-07 NOTE — Discharge Instructions (Signed)
You were evaluated in the Emergency Department and after careful evaluation, we did not find any emergent condition requiring admission or further testing in the hospital.  Your exam/testing today is overall reassuring.  Symptoms may be due to continued bronchitis.  Bronchitis can be due to a virus or bacteria.  Your x-ray was normal.  Take the Mucinex to help clear any mucus in the chest.  Use the Naprosyn for pain, and take the azithromycin as directed to clear any possible remaining bacteria.  Please return to the Emergency Department if you experience any worsening of your condition.   Thank you for allowing [place] to be a part of your care.

## 2023-02-07 NOTE — ED Provider Notes (Signed)
[facility] DE[facility] ([facility]) Emergency Department Provider Note MRN:  [id number]  Arrival date & time: 02/07/23     Chief Complaint   Cough   History of Present Illness   Kayla Wilkinson is a 52 y.o. year-old female with a history of hypertension, diabetes presenting to the ED with chief complaint of cough.  Persistent cough for at least 3 weeks, was given Omnicef, prednisone by PCP and not helping. Sent here by PCP for further testing.  She is having some soreness in the throat and chest when she coughs and so she is trying not to cough.  Feels like she still has mucus in her chest.  Having some headaches as well.  Denies fever, no other complaints.  Review of Systems  A thorough review of systems was obtained and all systems are negative except as noted in the HPI and PMH.   Patient's Health History    Past Medical History:  Diagnosis Date   Acid reflux    Chest pain    a. Coronary CT in 08/2019 showing calcium score of 0   Diabetes mellitus    Hypertension     Past Surgical History:  Procedure Laterality Date   BIOPSY  10/16/2020   Procedure: BIOPSY;  Surgeon: Lanelle Bal, DO;  Location: [facility] ENDO SU[facility];  Service: Endoscopy;;   BREAST BIOPSY Right 06/01/2019   Procedure: EXCISIONAL BREAST BIOPSY WITH NEEDLE LOCALIZATION;  Surgeon: Lucretia Roers, MD;  Location: [facility] ORS;  Service: General;  Laterality: Right;   CARDIAC CATHETERIZATION  2020   COLONOSCOPY WITH PROPOFOL N/A 10/16/2020   Procedure: COLONOSCOPY WITH PROPOFOL;  Surgeon: Lanelle Bal, DO;  Location: [facility];  Service: Endoscopy;  Laterality: N/A;  AM (diabetic)   ESOPHAGOGASTRODUODENOSCOPY (EGD) WITH PROPOFOL N/A 10/16/2020   Procedure: ESOPHAGOGASTRODUODENOSCOPY (EGD) WITH PROPOFOL;  Surgeon: Lanelle Bal, DO;  Location: [facility];  Service: Endoscopy;  Laterality: N/A;   RECTOCELE REPAIR N/A 07/08/2016   Procedure: POSTERIOR REPAIR (RECTOCELE);  Surgeon: Tilda Burrow,  MD;  Location: [facility] ORS;  Service: Gynecology;  Laterality: N/A;    Family History  Problem Relation Age of Onset   Diabetes Mother    Diabetes Maternal Grandmother    Cancer Other    Cancer - Cervical Maternal Aunt    Diabetes Son    Breast cancer Other    Colon cancer Neg Hx    Colon polyps Neg Hx     Social History   Socioeconomic History   Marital status: Married    Spouse name: Not on file   Number of children: Not on file   Years of education: Not on file   Highest education level: Not on file  Occupational History   Not on file  Tobacco Use   Smoking status: Former    Current packs/day: 0.00    Average packs/day: 0.3 packs/day for 1.5 years (0.4 ttl pk-yrs)    Types: Cigarettes    Start date: 01/02/2011    Quit date: 07/02/2012    Years since quitting: 10.6   Smokeless tobacco: Never  Vaping Use   Vaping status: Never Used  Substance and Sexual Activity   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   Drug use: No   Sexual activity: Not Currently    Birth control/protection: None  Other Topics Concern   Not on file  Social History Narrative   Not on file   Social Determinants of Health  Financial Resource Strain: Not on file  Food Insecurity: Not on file  Transportation Needs: Not on file  Physical Activity: Not on file  Stress: Not on file  Social Connections: Not on file  Intimate Partner Violence: Not on file     Physical Exam   Vitals:   02/07/23 0030 02/07/23 0100  BP: 125/74 122/72  Pulse: 80 80  Resp:  18  Temp:  98.5 F (36.9 C)  SpO2: 94% 96%    CONSTITUTIONAL: Well-appearing, NAD NEURO/PSYCH:  Alert and oriented x 3, no focal deficits EYES:  eyes equal and reactive ENT/NECK:  no LAD, no JVD CARDIO: Regular rate, well-perfused, normal S1 and S2 PULM:  CTAB no wheezing or rhonchi GI/GU:  non-distended, non-tender MSK/SPINE:  No gross deformities, no edema SKIN:  no rash, atraumatic   *Additional and/or pertinent  findings included in MDM below  Diagnostic and Interventional Summary    EKG Interpretation Date/Time:    Ventricular Rate:    PR Interval:    QRS Duration:    QT Interval:    QTC Calculation:   R Axis:      Text Interpretation:         Labs Reviewed  RESP PANEL BY RT-PCR (RSV, FLU A&B, COVID)  [id number]    DG Chest 2 View  Final Result      Medications - No data to display   Procedures  /  Critical Care Procedures  ED Course and Medical Decision Making  Initial Impression and Ddx Symptoms seem consistent with a likely viral bronchitis.  This would explain why no improvement after Omnicef.  She is in no acute distress, not having any chest pain or shortness of breath, no recent leg pain or swelling, highly doubt emergent cardiopulmonary etiology.  Past medical/surgical history that increases complexity of ED encounter: Hypertension diabetes  Interpretation of Diagnostics I personally reviewed the chest x-ray and my interpretation is as follows: Normal    Patient Reassessment and Ultimate Disposition/Management     Discharge  Patient management required discussion with the following services or consulting groups:  None  Complexity of Problems Addressed Acute complicated illness or Injury  Additional Data Reviewed and Analyzed Further history obtained from: None  Additional Factors Impacting ED Encounter Risk Prescriptions  Elmer Sow. Pilar Plate, MD [facility] Emergency Medicine [facility] [email or web address]  Final Clinical Impressions(s) / ED Diagnoses     ICD-10-CM   1. Acute cough  R05.1       ED Discharge Orders          Ordered    guaiFENesin (MUCINEX) 600 MG 12 hr tablet  2 times daily PRN        02/07/23 0123    naproxen (NAPROSYN) 500 MG tablet  2 times daily        02/07/23 0123    azithromycin (ZITHROMAX) 250 MG tablet        02/07/23 0123             Discharge Instructions Discussed with and Provided to Patient:     Discharge Instructions      You were evaluated in the Emergency Department and after careful evaluation, we did not find any emergent condition requiring admission or further testing in the hospital.  Your exam/testing today is overall reassuring.  Symptoms may be due to continued bronchitis.  Bronchitis can be due to a virus or bacteria.  Your x-ray was normal.  Take the Mucinex to help clear any mucus in  the chest.  Use the Naprosyn for pain, and take the azithromycin as directed to clear any possible remaining bacteria.  Please return to the Emergency Department if you experience any worsening of your condition.   Thank you for allowing [place] to be a part of your care.      Sabas Sous, MD 02/07/23 [phone]

## 2023-03-04 ENCOUNTER — Ambulatory Visit
Admission: EM | Admit: 2023-03-04 | Discharge: 2023-03-04 | Disposition: A | Discharge status: Home / Self Care | Payer: No Typology Code available for payment source | Attending: Family Medicine | Admitting: Family Medicine

## 2023-03-04 DIAGNOSIS — Z20822 Contact with and (suspected) exposure to covid-19: Secondary | ICD-10-CM | POA: Insufficient documentation

## 2023-03-04 DIAGNOSIS — J069 Acute upper respiratory infection, unspecified: Secondary | ICD-10-CM | POA: Diagnosis present

## 2023-03-04 MED ORDER — PROMETHAZINE-DM 6.25-15 MG/5ML PO SYRP: 5.0000 mL | ORAL_SOLUTION | Freq: Four times a day (QID) | ORAL | 0 refills | Status: DC | PRN

## 2023-03-04 MED ORDER — MOLNUPIRAVIR EUA 200MG CAPSULE: 4.0000 | ORAL_CAPSULE | Freq: Two times a day (BID) | ORAL | 0 refills | Status: AC

## 2023-03-04 NOTE — ED Triage Notes (Signed)
Pt c/o COVID exposure, cough, headache, sore throat,nasal congestion x 6 days. Coughing up mucus

## 2023-03-04 NOTE — ED Provider Notes (Signed)
[facility]    CSN: [id number] Arrival date & time: 03/04/23  4098      History   Chief Complaint No chief complaint on file.   HPI Kayla Wilkinson is a 52 y.o. female.   Patient presenting today with 5 to 6-day history of cough, headache, sore throat, congestion, fatigue.  Denies chest pain, shortness of breath, abdominal pain, nausea vomiting or diarrhea.  So far tried some DayQuil with minimal relief.  Recent COVID exposures at work.  No known history of chronic pulmonary disease.    Past Medical History:  Diagnosis Date   Acid reflux    Chest pain    a. Coronary CT in 08/2019 showing calcium score of 0   Diabetes mellitus    Hypertension     Patient Active Problem List   Diagnosis Date Noted   Dyspepsia 03/30/2020   Encounter for screening colonoscopy 03/30/2020   Mass of right breast on mammogram 05/26/2019   Personal history of noncompliance with medical treatment, presenting hazards to health 08/03/2017   Uncontrolled type 2 diabetes mellitus with hyperglycemia (HCC) 07/15/2017   Class 2 severe obesity due to excess calories with serious comorbidity and body mass index (BMI) of 38.0 to 38.9 in adult (HCC) 07/15/2017   Essential hypertension, benign 07/15/2017   Postop check 07/18/2016   Diabetes mellitus type 2 in obese 05/14/2016   Genital herpes 03/31/2016   Rectocele 03/24/2016   Rotator cuff tear, left 07/28/2013   Pain in joint, shoulder region 06/16/2013   Muscle weakness (generalized) 06/16/2013   Bursitis of shoulder region 06/07/2013   Ankle fracture 08/18/2011   Ankle fracture, lateral malleolus, closed 05/20/2011    Past Surgical History:  Procedure Laterality Date   BIOPSY  10/16/2020   Procedure: BIOPSY;  Surgeon: Lanelle Bal, DO;  [facility];  Service: Endoscopy;;   BREAST BIOPSY Right 06/01/2019   Procedure: EXCISIONAL BREAST BIOPSY WITH NEEDLE LOCALIZATION;  Surgeon: Lucretia Roers, MD;   Location: [facility] ORS;  Service: General;  Laterality: Right;   CARDIAC CATHETERIZATION  2020   COLONOSCOPY WITH PROPOFOL N/A 10/16/2020   Procedure: COLONOSCOPY WITH PROPOFOL;  Surgeon: Lanelle Bal, DO;  Location[facility];  Service: Endoscopy;  Laterality: N/A;  AM (diabetic)   ESOPHAGOGASTRODUODENOSCOPY (EGD) WITH PROPOFOL N/A 10/16/2020   Procedure: ESOPHAGOGASTRODUODENOSCOPY (EGD) WITH PROPOFOL;  Surgeon: Lanelle Bal, DO;  Location[facility];  Service: Endoscopy;  Laterality: N/A;   RECTOCELE REPAIR N/A 07/08/2016   Procedure: POSTERIOR REPAIR (RECTOCELE);  Surgeon: Tilda Burrow, MD;  Location: [facility];  Service: Gynecology;  Laterality: N/A;    OB History     Gravida  3   Para  2   Term  2   Preterm      AB  1   Living         SAB  1   IAB      Ectopic      Multiple      Live Births               Home Medications    Prior to Admission medications   Medication Sig Start Date End Date Taking? Authorizing Provider  molnupiravir EUA (LAGEVRIO) 200 mg CAPS capsule Take 4 capsules (800 mg total) by mouth 2 (two) times daily for 5 days. 03/04/23 03/09/23 Yes Particia Nearing, PA-C  promethazine-dextromethorphan (PROMETHAZINE-DM) 6.25-15 MG/5ML syrup Take 5 mLs by mouth 4 (four) times daily as  needed. 03/04/23  Yes Particia Nearing, PA-C  albuterol (VENTOLIN HFA) 108 (90 Base) MCG/ACT inhaler Inhale 2 puffs into the lungs every 6 (six) hours as needed for wheezing or shortness of breath. 02/23/22   Leath-Warren, Sadie Haber, NP  aspirin EC 81 MG tablet Take 81 mg by mouth daily.    [provider]  azithromycin (ZITHROMAX) 250 MG tablet Take 2 tablets together on the first day, then 1 every day until finished. 02/07/23   Sabas Sous, MD  cetirizine (ZYRTEC) 10 MG tablet Take 1 tablet (10 mg total) by mouth daily. 12/27/22   Leath-Warren, Sadie Haber, NP  fluticasone (FLONASE) 50 MCG/ACT nasal spray Place 2 sprays into both nostrils  daily. 12/27/22   Leath-Warren, Sadie Haber, NP  glipiZIDE (GLUCOTROL) 5 MG tablet Take 5 mg by mouth daily.     [provider]  guaiFENesin (MUCINEX) 600 MG 12 hr tablet Take 1 tablet (600 mg total) by mouth 2 (two) times daily as needed. 02/07/23   Sabas Sous, MD  Insulin Glargine (LANTUS SOLOSTAR) 100 UNIT/ML Solostar Pen Inject 50 Units into the skin daily at 10 pm. Patient taking differently: Inject 50 Units into the skin daily as needed (High blood sugar). 07/15/17   Roma Kayser, MD  lidocaine (XYLOCAINE) 2 % solution Use as directed 10 mLs in the mouth or throat every 3 (three) hours as needed for mouth pain. 01/05/22   Particia Nearing, PA-C  losartan (COZAAR) 25 MG tablet Take 25 mg by mouth daily.    [provider]  meloxicam (MOBIC) 15 MG tablet Take 1 tablet (15 mg total) by mouth daily. 01/31/21   Hyatt, Max T, DPM  meloxicam (MOBIC) 15 MG tablet Take 1 tablet (15 mg total) by mouth daily. 02/12/22   Lenn Sink, DPM  metFORMIN (GLUCOPHAGE) 1000 MG tablet Take 1,000 mg by mouth 2 (two) times daily with a meal.    [provider]  metoprolol tartrate (LOPRESSOR) 25 MG tablet Take 25 mg by mouth 2 (two) times daily.  03/01/19   [provider]  naproxen (NAPROSYN) 500 MG tablet Take 1 tablet (500 mg total) by mouth 2 (two) times daily. 02/07/23   Sabas Sous, MD  omeprazole (PRILOSEC) 40 MG capsule Take 40 mg by mouth 2 (two) times daily before a meal. 05/20/19   [provider]  pseudoephedrine (SUDAFED) 30 MG tablet Take 1 tablet (30 mg total) by mouth 2 (two) times daily. 12/27/22   Leath-Warren, Sadie Haber, NP  rosuvastatin (CRESTOR) 10 MG tablet Take 20 mg by mouth daily.    [provider]  STEGLATRO 5 MG TABS Take 5 mg by mouth every morning.  05/19/18   [provider]  valACYclovir (VALTREX) 1000 MG tablet Take 1 tablet (1,000 mg total) by mouth 2 (two) times daily. X 10 days, then once daily  thereafter Patient taking differently: Take 1,000 mg by mouth 2 (two) times daily. 03/24/16   Cheral Marker, CNM    Family History Family History  Problem Relation Age of Onset   Diabetes Mother    Diabetes Maternal Grandmother    Cancer Other    Cancer - Cervical Maternal Aunt    Diabetes Son    Breast cancer Other    Colon cancer Neg Hx    Colon polyps Neg Hx     Social History Social History   Tobacco Use   Smoking status: Former    Current packs/day:  0.00    Average packs/day: 0.3 packs/day for 1.5 years (0.4 ttl pk-yrs)    Types: Cigarettes    Start date: 01/02/2011    Quit date: 07/02/2012    Years since quitting: 10.6   Smokeless tobacco: Never  Vaping Use   Vaping status: Never Used  Substance Use Topics   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   Review of Systems Review of Systems Per HPI  Physical Exam Triage Vital Signs ED Triage Vitals  Encounter Vitals Group     BP 03/04/23 0839 127/81     Systolic BP Percentile --      Diastolic BP Percentile --      Pulse Rate 03/04/23 0839 (!) 101     Resp 03/04/23 0839 13     Temp 03/04/23 0839 97.9 F (36.6 C)     Temp Source 03/04/23 0839 Oral     SpO2 --      Weight --      Height --      Head Circumference --      Peak Flow --      Pain Score 03/04/23 0842 9     Pain Loc --      Pain Education --      Exclude from Growth Chart --    No data found.  Updated Vital Signs BP 127/81 (BP Location: Right Arm)   Pulse (!) 101   Temp 97.9 F (36.6 C) (Oral)   Resp 13   LMP 04/05/2017   Visual Acuity Right Eye Distance:   Left Eye Distance:   Bilateral Distance:    Right Eye Near:   Left Eye Near:    Bilateral Near:     Physical Exam Vitals and nursing note reviewed.  Constitutional:      Appearance: Normal appearance.  HENT:     Head: Atraumatic.     Right Ear: Tympanic membrane and external ear  normal.     Left Ear: Tympanic membrane and external ear normal.     Nose: Rhinorrhea present.     Mouth/Throat:     Mouth: Mucous membranes are moist.     Pharynx: Posterior oropharyngeal erythema present.  Eyes:     Extraocular Movements: Extraocular movements intact.     Conjunctiva/sclera: Conjunctivae normal.  Cardiovascular:     Rate and Rhythm: Normal rate and regular rhythm.     Heart sounds: Normal heart sounds.  Pulmonary:     Effort: Pulmonary effort is normal.     Breath sounds: Normal breath sounds. No wheezing or rales.  Musculoskeletal:        General: Normal range of motion.     Cervical back: Normal range of motion and neck supple.  Skin:    General: Skin is warm and dry.  Neurological:     Mental Status: She is alert and oriented to person, place, and time.  Psychiatric:        Mood and Affect: Mood normal.        Thought Content: Thought content normal.      UC Treatments / Results  Labs (all labs ordered are listed, but only abnormal results are displayed) Labs Reviewed  SARS CORONAVIRUS 2 (TAT 6-24 HRS)    EKG   Radiology No results found.  Procedures Procedures (including critical care time)  Medications Ordered in UC Medications -  No data to display  Initial Impression / Assessment and Plan / UC Course  I have reviewed the triage vital signs and the nursing notes.  Pertinent labs & imaging results that were available during my care of the patient were reviewed by me and considered in my medical decision making (see chart for details).     Vital signs and exam overall reassuring, symptoms and exposures cause high suspicion for COVID-19.  Will start molnupiravir, Phenergan DM while awaiting results and adjust if needed.  Return precautions reviewed  Final Clinical Impressions(s) / UC Diagnoses   Final diagnoses:  Viral URI with cough  Exposure to COVID-19 virus   Discharge Instructions   None    ED Prescriptions     Medication  Sig Dispense Auth. Provider   molnupiravir EUA (LAGEVRIO) 200 mg CAPS capsule Take 4 capsules (800 mg total) by mouth 2 (two) times daily for 5 days. 40 capsule Particia Nearing, [place]   promethazine-dextromethorphan (PROMETHAZINE-DM) 6.25-15 MG/5ML syrup Take 5 mLs by mouth 4 (four) times daily as needed. 100 mL Particia Nearing, [place]      PDMP not reviewed this encounter.   Particia Nearing, [place] 03/04/23 1039

## 2023-03-05 LAB — SARS CORONAVIRUS 2 (TAT 6-24 HRS): SARS Coronavirus 2: NEGATIVE

## 2023-10-01 ENCOUNTER — Encounter: Payer: Self-pay | Admitting: Emergency Medicine

## 2023-10-01 ENCOUNTER — Ambulatory Visit
Admission: EM | Admit: 2023-10-01 | Discharge: 2023-10-01 | Disposition: A | Discharge status: Home / Self Care | Attending: Family Medicine | Admitting: Family Medicine

## 2023-10-01 ENCOUNTER — Other Ambulatory Visit: Payer: Self-pay

## 2023-10-01 DIAGNOSIS — J069 Acute upper respiratory infection, unspecified: Secondary | ICD-10-CM

## 2023-10-01 LAB — POC COVID19/FLU A&B COMBO
Covid Antigen, POC: NEGATIVE
Influenza A Antigen, POC: NEGATIVE
Influenza B Antigen, POC: NEGATIVE

## 2023-10-01 MED ORDER — BENZONATATE 100 MG PO CAPS: 100.0000 mg | ORAL_CAPSULE | Freq: Three times a day (TID) | ORAL | 0 refills | Status: DC | PRN

## 2023-10-01 NOTE — ED Provider Notes (Signed)
 [facility]    CSN: [id number] Arrival date & time: 10/01/23  1149      History   Chief Complaint Chief Complaint  Patient presents with   Cough    HPI Kayla Wilkinson is a 53 y.o. female.   Patient presents today with 3-day history of fever, Tmax 101.8 F, body aches and chills, congested cough, chest congestion, runny and stuffy nose, sore throat, right ear pain and popping, diarrhea, decreased appetite, and fatigue.  Patient denies shortness of breath or chest pain, abdominal pain, nausea/vomiting.  Reports she was around her coworkers who have been sick with similar symptoms.  Has been taken over-the-counter Mucinex and DayQuil with Vicks for symptoms with minimal temporary improvement.    Past Medical History:  Diagnosis Date   Acid reflux    Chest pain    a. Coronary CT in 08/2019 showing calcium score of 0   Diabetes mellitus    Hypertension     Patient Active Problem List   Diagnosis Date Noted   Dyspepsia 03/30/2020   Encounter for screening colonoscopy 03/30/2020   Mass of right breast on mammogram 05/26/2019   Personal history of noncompliance with medical treatment, presenting hazards to health 08/03/2017   Uncontrolled type 2 diabetes mellitus with hyperglycemia (HCC) 07/15/2017   Class 2 severe obesity due to excess calories with serious comorbidity and body mass index (BMI) of 38.0 to 38.9 in adult (HCC) 07/15/2017   Essential hypertension, benign 07/15/2017   Postop check 07/18/2016   Type 2 diabetes mellitus with obesity (HCC) 05/14/2016   Genital herpes 03/31/2016   Rectocele 03/24/2016   Rotator cuff tear, left 07/28/2013   Pain in joint, shoulder region 06/16/2013   Muscle weakness (generalized) 06/16/2013   Bursitis of shoulder region 06/07/2013   Ankle fracture 08/18/2011   Ankle fracture, lateral malleolus, closed 05/20/2011    Past Surgical History:  Procedure Laterality Date   BIOPSY  10/16/2020   Procedure: BIOPSY;   Surgeon: Lanelle Bal, DO;  Location: [facility];  Service: Endoscopy;;   BREAST BIOPSY Right 06/01/2019   Procedure: EXCISIONAL BREAST BIOPSY WITH NEEDLE LOCALIZATION;  Surgeon: Lucretia Roers, MD;  Location: [facility];  Service: General;  Laterality: Right;   CARDIAC CATHETERIZATION  2020   COLONOSCOPY WITH PROPOFOL N/A 10/16/2020   Procedure: COLONOSCOPY WITH PROPOFOL;  Surgeon: Lanelle Bal, DO;  Location: [facility];  Service: Endoscopy;  Laterality: N/A;  AM (diabetic)   ESOPHAGOGASTRODUODENOSCOPY (EGD) WITH PROPOFOL N/A 10/16/2020   Procedure: ESOPHAGOGASTRODUODENOSCOPY (EGD) WITH PROPOFOL;  Surgeon: Lanelle Bal, DO;  [facility];  Service: Endoscopy;  Laterality: N/A;   RECTOCELE REPAIR N/A 07/08/2016   Procedure: POSTERIOR REPAIR (RECTOCELE);  Surgeon: Tilda Burrow, MD;  Location: [facility];  Service: Gynecology;  Laterality: N/A;    OB History     Gravida  3   Para  2   Term  2   Preterm      AB  1   Living         SAB  1   IAB      Ectopic      Multiple      Live Births               Home Medications    Prior to Admission medications   Medication Sig Start Date End Date Taking? Authorizing Provider  benzonatate (TESSALON) 100 MG capsule Take 1 capsule (100 mg total) by mouth  3 (three) times daily as needed for cough. Do not take with alcohol or while operating or driving heavy machinery 1/61/09  Yes Valentino Nose, NP  albuterol (VENTOLIN HFA) 108 (90 Base) MCG/ACT inhaler Inhale 2 puffs into the lungs every 6 (six) hours as needed for wheezing or shortness of breath. 02/23/22   Leath-Warren, Sadie Haber, NP  aspirin EC 81 MG tablet Take 81 mg by mouth daily.    [provider]  cetirizine (ZYRTEC) 10 MG tablet Take 1 tablet (10 mg total) by mouth daily. 12/27/22   Leath-Warren, Sadie Haber, NP  fluticasone (FLONASE) 50 MCG/ACT nasal spray Place 2 sprays into both nostrils daily. 12/27/22   Leath-Warren, Sadie Haber, NP  glipiZIDE (GLUCOTROL) 5 MG tablet Take 5 mg by mouth daily.     [provider]  guaiFENesin (MUCINEX) 600 MG 12 hr tablet Take 1 tablet (600 mg total) by mouth 2 (two) times daily as needed. 02/07/23   Sabas Sous, MD  Insulin Glargine (LANTUS SOLOSTAR) 100 UNIT/ML Solostar Pen Inject 50 Units into the skin daily at 10 pm. Patient taking differently: Inject 50 Units into the skin daily as needed (High blood sugar). 07/15/17   Roma Kayser, MD  losartan (COZAAR) 25 MG tablet Take 25 mg by mouth daily.    [provider]  meloxicam (MOBIC) 15 MG tablet Take 1 tablet (15 mg total) by mouth daily. 01/31/21   Hyatt, Max T, DPM  meloxicam (MOBIC) 15 MG tablet Take 1 tablet (15 mg total) by mouth daily. 02/12/22   Lenn Sink, DPM  metFORMIN (GLUCOPHAGE) 1000 MG tablet Take 1,000 mg by mouth 2 (two) times daily with a meal.    [provider]  metoprolol tartrate (LOPRESSOR) 25 MG tablet Take 25 mg by mouth 2 (two) times daily.  03/01/19   [provider]  naproxen (NAPROSYN) 500 MG tablet Take 1 tablet (500 mg total) by mouth 2 (two) times daily. 02/07/23   Sabas Sous, MD  omeprazole (PRILOSEC) 40 MG capsule Take 40 mg by mouth 2 (two) times daily before a meal. 05/20/19   [provider]  pseudoephedrine (SUDAFED) 30 MG tablet Take 1 tablet (30 mg total) by mouth 2 (two) times daily. 12/27/22   Leath-Warren, Sadie Haber, NP  rosuvastatin (CRESTOR) 10 MG tablet Take 20 mg by mouth daily.    [provider]  STEGLATRO 5 MG TABS Take 5 mg by mouth every morning.  05/19/18   [provider]  valACYclovir (VALTREX) 1000 MG tablet Take 1 tablet (1,000 mg total) by mouth 2 (two) times daily. X 10 days, then once daily thereafter Patient taking differently: Take 1,000 mg by mouth 2 (two) times daily. 03/24/16   Cheral Marker, CNM    Family History Family History  Problem Relation Age of Onset   Diabetes Mother    Diabetes  Maternal Grandmother    Cancer Other    Cancer - Cervical Maternal Aunt    Diabetes Son    Breast cancer Other    Colon cancer Neg Hx    Colon polyps Neg Hx     Social History Social History   Tobacco Use   Smoking status: Former    Current packs/day: 0.00    Average packs/day: 0.3 packs/day for 1.5 years (0.4 ttl pk-yrs)    Types: Cigarettes    Start date: 01/02/2011    Quit date: 07/02/2012    Years since quitting: 11.2  Smokeless tobacco: Never  Vaping Use   Vaping status: Never Used  Substance Use Topics   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   Review of Systems Review of Systems Per HPI  Physical Exam Triage Vital Signs ED Triage Vitals  Encounter Vitals Group     BP 10/01/23 1239 135/82     Systolic BP Percentile --      Diastolic BP Percentile --      Pulse Rate 10/01/23 1239 96     Resp 10/01/23 1239 20     Temp 10/01/23 1239 98.3 F (36.8 C)     Temp Source 10/01/23 1239 Oral     SpO2 10/01/23 1239 99 %     Weight --      Height --      Head Circumference --      Peak Flow --      Pain Score 10/01/23 1242 9     Pain Loc --      Pain Education --      Exclude from Growth Chart --    No data found.  Updated Vital Signs BP 135/82 (BP Location: Right Arm)   Pulse 96   Temp 98.3 F (36.8 C) (Oral)   Resp 20   LMP 04/05/2017   SpO2 99%   Visual Acuity Right Eye Distance:   Left Eye Distance:   Bilateral Distance:    Right Eye Near:   Left Eye Near:    Bilateral Near:     Physical Exam Vitals and nursing note reviewed.  Constitutional:      General: She is not in acute distress.    Appearance: Normal appearance. She is not ill-appearing or toxic-appearing.  HENT:     Head: Normocephalic and atraumatic.     Right Ear: Tympanic membrane, ear canal and external ear normal.     Left Ear: Tympanic membrane, ear canal and external ear normal.      Nose: Congestion present. No rhinorrhea.     Mouth/Throat:     Mouth: Mucous membranes are moist.     Pharynx: Oropharynx is clear. No oropharyngeal exudate or posterior oropharyngeal erythema.  Eyes:     General: No scleral icterus.    Extraocular Movements: Extraocular movements intact.  Cardiovascular:     Rate and Rhythm: Normal rate and regular rhythm.  Pulmonary:     Effort: Pulmonary effort is normal. No respiratory distress.     Breath sounds: Normal breath sounds. No wheezing, rhonchi or rales.  Musculoskeletal:     Cervical back: Normal range of motion and neck supple.  Lymphadenopathy:     Cervical: No cervical adenopathy.  Skin:    General: Skin is warm and dry.     Coloration: Skin is not jaundiced or pale.     Findings: No erythema or rash.  Neurological:     Mental Status: She is alert and oriented to person, place, and time.  Psychiatric:        Behavior: Behavior is cooperative.      UC Treatments / Results  Labs (all labs ordered are listed, but only abnormal results are displayed) Labs Reviewed  POC COVID19/FLU A&B COMBO    EKG   Radiology No results found.  Procedures Procedures (including critical care time)  Medications Ordered in UC Medications - No data to display  Initial Impression /  Assessment and Plan / UC Course  I have reviewed the triage vital signs and the nursing notes.  Pertinent labs & imaging results that were available during my care of the patient were reviewed by me and considered in my medical decision making (see chart for details).   Patient is well-appearing, normotensive, afebrile, not tachycardic, not tachypneic, oxygenating well on room air.    1. Viral URI with cough Vitals and exam are reassuring today COVID-19, influenza testing is negative, suspect viral etiology Supportive care discussed with patient Start cough suppressant medication ER and return precautions discussed Work excuse provided  The patient  was given the opportunity to ask questions.  All questions answered to their satisfaction.  The patient is in agreement to this plan.   Final Clinical Impressions(s) / UC Diagnoses   Final diagnoses:  Viral URI with cough     Discharge Instructions      You have a viral upper respiratory infection.  Symptoms should improve over the next week to 10 days.  If you develop chest pain or shortness of breath, go to the emergency room.  COVID-19 and influenza testing today is negative.   Some things that can make you feel better are: - Increased rest - Increasing fluid with water/sugar free electrolytes - Acetaminophen and ibuprofen as needed for fever/pain - Salt water gargling, chloraseptic spray and throat lozenges - OTC guaifenesin (Mucinex) 600 mg twice daily for congestion - Saline sinus flushes or a neti pot - Humidifying the air -Tessalon Perles every 8 hours as needed for dry cough      ED Prescriptions     Medication Sig Dispense Auth. Provider   benzonatate (TESSALON) 100 MG capsule Take 1 capsule (100 mg total) by mouth 3 (three) times daily as needed for cough. Do not take with alcohol or while operating or driving heavy machinery 21 capsule Valentino Nose, NP      PDMP not reviewed this encounter.   Valentino Nose, NP 10/01/23 [phone]

## 2023-10-01 NOTE — ED Triage Notes (Signed)
 Pt reports sore throat,cough, right ear pain, general malaise, chest congestion since Sunday. Reports coworkers had similar.

## 2023-10-01 NOTE — Discharge Instructions (Addendum)
 You have a viral upper respiratory infection.  Symptoms should improve over the next week to 10 days.  If you develop chest pain or shortness of breath, go to the emergency room.  COVID-19 and influenza testing today is negative.   Some things that can make you feel better are: - Increased rest - Increasing fluid with water/sugar free electrolytes - Acetaminophen and ibuprofen as needed for fever/pain - Salt water gargling, chloraseptic spray and throat lozenges - OTC guaifenesin (Mucinex) 600 mg twice daily for congestion - Saline sinus flushes or a neti pot - Humidifying the air -Tessalon Perles every 8 hours as needed for dry cough

## 2023-11-06 ENCOUNTER — Ambulatory Visit (INDEPENDENT_AMBULATORY_CARE_PROVIDER_SITE_OTHER): Admitting: Physician Assistant

## 2023-11-06 ENCOUNTER — Encounter: Payer: Self-pay | Admitting: Physician Assistant

## 2023-11-06 VITALS — BP 126/82 | HR 117 | Temp 97.4°F | Ht 62.0 in | Wt 211.0 lb

## 2023-11-06 DIAGNOSIS — Z7984 Long term (current) use of oral hypoglycemic drugs: Secondary | ICD-10-CM

## 2023-11-06 DIAGNOSIS — E1165 Type 2 diabetes mellitus with hyperglycemia: Secondary | ICD-10-CM

## 2023-11-06 DIAGNOSIS — Z1231 Encounter for screening mammogram for malignant neoplasm of breast: Secondary | ICD-10-CM

## 2023-11-06 DIAGNOSIS — E7800 Pure hypercholesterolemia, unspecified: Secondary | ICD-10-CM | POA: Diagnosis not present

## 2023-11-06 DIAGNOSIS — Z7689 Persons encountering health services in other specified circumstances: Secondary | ICD-10-CM

## 2023-11-06 DIAGNOSIS — I10 Essential (primary) hypertension: Secondary | ICD-10-CM

## 2023-11-06 MED ORDER — GLIPIZIDE ER 10 MG PO TB24: 10.0000 mg | ORAL_TABLET | Freq: Every day | ORAL | 1 refills | Status: DC

## 2023-11-06 MED ORDER — FARXIGA 5 MG PO TABS: 5.0000 mg | ORAL_TABLET | Freq: Every day | ORAL | 1 refills | Status: DC

## 2023-11-06 MED ORDER — LOSARTAN POTASSIUM 25 MG PO TABS: 25.0000 mg | ORAL_TABLET | Freq: Every day | ORAL | 1 refills | Status: DC

## 2023-11-06 MED ORDER — METFORMIN HCL 1000 MG PO TABS: 1000.0000 mg | ORAL_TABLET | Freq: Two times a day (BID) | ORAL | 1 refills | Status: DC

## 2023-11-06 MED ORDER — METOPROLOL TARTRATE 25 MG PO TABS: 25.0000 mg | ORAL_TABLET | Freq: Two times a day (BID) | ORAL | 1 refills | Status: DC

## 2023-11-06 MED ORDER — ROSUVASTATIN CALCIUM 20 MG PO TABS: 20.0000 mg | ORAL_TABLET | Freq: Every day | ORAL | 1 refills | Status: DC

## 2023-11-06 MED ORDER — OMEPRAZOLE 40 MG PO CPDR: 40.0000 mg | DELAYED_RELEASE_CAPSULE | Freq: Two times a day (BID) | ORAL | 1 refills | Status: DC

## 2023-11-06 NOTE — Assessment & Plan Note (Signed)
 126/82 Controlled. Continue current medications. No change in management. Discussed DASH diet and dietary sodium restrictions.  Increase dietary efforts and physical activity.

## 2023-11-06 NOTE — Assessment & Plan Note (Signed)
 Stable. Continue with current management without changes. Discussed healthy diet and lifestyle. Lipid panel today.

## 2023-11-06 NOTE — Progress Notes (Signed)
 New Patient Office Visit  Subjective    Patient ID: Kayla Wilkinson, female    DOB: 17-Feb-1971  Age: 53 y.o. MRN: [id number]  CC:  Chief Complaint  Patient presents with   Establish Care    Refills    HPI Kayla Wilkinson presents to establish care  Patient presents today with past medical history significant for hypertension, hyperlipidemia, type 2 diabetes, osteoarthritis, and tachycardia. She reports daily compliance with medications, however needs medication refill today. She denies hypoglycemia, she follows regularly with endocrinology. She denies chest pain, shortness of breath, or visual changes. She is overdue for physical, Pap, and mammogram. Patient does not have additional concerns or complaints today.  Outpatient Encounter Medications as of 11/06/2023  Medication Sig   ACCU-CHEK GUIDE TEST test strip 1 each by Other route as needed.   aspirin EC 81 MG tablet Take 81 mg by mouth daily.   valACYclovir  (VALTREX ) 1000 MG tablet Take 1 tablet (1,000 mg total) by mouth 2 (two) times daily. X 10 days, then once daily thereafter (Patient taking differently: Take 1,000 mg by mouth 2 (two) times daily.)   [DISCONTINUED] FARXIGA 5 MG TABS tablet Take 5 mg by mouth daily.   [DISCONTINUED] glipiZIDE  (GLUCOTROL  XL) 10 MG 24 hr tablet Take 10 mg by mouth daily.   [DISCONTINUED] losartan  (COZAAR ) 25 MG tablet Take 25 mg by mouth daily.   [DISCONTINUED] metFORMIN  (GLUCOPHAGE ) 1000 MG tablet Take 1,000 mg by mouth 2 (two) times daily with a meal.   [DISCONTINUED] metoprolol  tartrate (LOPRESSOR ) 25 MG tablet Take 25 mg by mouth 2 (two) times daily.    [DISCONTINUED] omeprazole (PRILOSEC) 40 MG capsule Take 40 mg by mouth 2 (two) times daily before a meal.   [DISCONTINUED] rosuvastatin (CRESTOR) 20 MG tablet Take 20 mg by mouth daily.   FARXIGA 5 MG TABS tablet Take 1 tablet (5 mg total) by mouth daily.   glipiZIDE  (GLUCOTROL  XL) 10 MG 24 hr tablet Take 1 tablet (10 mg total)  by mouth daily.   losartan  (COZAAR ) 25 MG tablet Take 1 tablet (25 mg total) by mouth daily.   metFORMIN  (GLUCOPHAGE ) 1000 MG tablet Take 1 tablet (1,000 mg total) by mouth 2 (two) times daily with a meal.   metoprolol  tartrate (LOPRESSOR ) 25 MG tablet Take 1 tablet (25 mg total) by mouth 2 (two) times daily.   omeprazole (PRILOSEC) 40 MG capsule Take 1 capsule (40 mg total) by mouth 2 (two) times daily before a meal.   rosuvastatin (CRESTOR) 20 MG tablet Take 1 tablet (20 mg total) by mouth daily.   [DISCONTINUED] albuterol  (VENTOLIN  HFA) 108 (90 Base) MCG/ACT inhaler Inhale 2 puffs into the lungs every 6 (six) hours as needed for wheezing or shortness of breath.   [DISCONTINUED] benzonatate  (TESSALON ) 100 MG capsule Take 1 capsule (100 mg total) by mouth 3 (three) times daily as needed for cough. Do not take with alcohol or while operating or driving heavy machinery   [DISCONTINUED] cetirizine  (ZYRTEC ) 10 MG tablet Take 1 tablet (10 mg total) by mouth daily.   [DISCONTINUED] fluticasone  (FLONASE ) 50 MCG/ACT nasal spray Place 2 sprays into both nostrils daily.   [DISCONTINUED] glipiZIDE  (GLUCOTROL ) 5 MG tablet Take 5 mg by mouth daily.    [DISCONTINUED] guaiFENesin  (MUCINEX ) 600 MG 12 hr tablet Take 1 tablet (600 mg total) by mouth 2 (two) times daily as needed.   [DISCONTINUED] Insulin  Glargine (LANTUS  SOLOSTAR) 100 UNIT/ML Solostar Pen Inject 50 Units into the skin daily at  10 pm. (Patient taking differently: Inject 50 Units into the skin daily as needed (High blood sugar).)   [DISCONTINUED] meloxicam  (MOBIC ) 15 MG tablet Take 1 tablet (15 mg total) by mouth daily.   [DISCONTINUED] meloxicam  (MOBIC ) 15 MG tablet Take 1 tablet (15 mg total) by mouth daily.   [DISCONTINUED] naproxen  (NAPROSYN ) 500 MG tablet Take 1 tablet (500 mg total) by mouth 2 (two) times daily.   [DISCONTINUED] omeprazole (PRILOSEC) 40 MG capsule Take 1 capsule by mouth 2 (two) times daily.   [DISCONTINUED] phentermine  (ADIPEX-P) 37.5 MG tablet Take 1 tablet by mouth daily. (Patient not taking: Reported on 11/06/2023)   [DISCONTINUED] pseudoephedrine  (SUDAFED) 30 MG tablet Take 1 tablet (30 mg total) by mouth 2 (two) times daily.   [DISCONTINUED] rosuvastatin (CRESTOR) 10 MG tablet Take 20 mg by mouth daily.   [DISCONTINUED] STEGLATRO 5 MG TABS Take 5 mg by mouth every morning.    No facility-administered encounter medications on file as of 11/06/2023.    Past Medical History:  Diagnosis Date   Acid reflux    Chest pain    a. Coronary CT in 08/2019 showing calcium score of 0   Diabetes mellitus    Hypertension     Past Surgical History:  Procedure Laterality Date   BIOPSY  10/16/2020   Procedure: BIOPSY;  Surgeon: Vinetta Greening, DO;  [facility];  Service: Endoscopy;;   BREAST BIOPSY Right 06/01/2019   Procedure: EXCISIONAL BREAST BIOPSY WITH NEEDLE LOCALIZATION;  Surgeon: Awilda Bogus, MD;  Location: [facility];  Service: General;  Laterality: Right;   CARDIAC CATHETERIZATION  2020   COLONOSCOPY WITH PROPOFOL  N/A 10/16/2020   Procedure: COLONOSCOPY WITH PROPOFOL ;  Surgeon: Vinetta Greening, DO;  [facility];  Service: Endoscopy;  Laterality: N/A;  AM (diabetic)   ESOPHAGOGASTRODUODENOSCOPY (EGD) WITH PROPOFOL  N/A 10/16/2020   Procedure: ESOPHAGOGASTRODUODENOSCOPY (EGD) WITH PROPOFOL ;  Surgeon: Vinetta Greening, DO;  [facility];  Service: Endoscopy;  Laterality: N/A;   RECTOCELE REPAIR N/A 07/08/2016   Procedure: POSTERIOR REPAIR (RECTOCELE);  Surgeon: Albino Hum, MD;  Location: [facility];  Service: Gynecology;  Laterality: N/A;    Family History  Problem Relation Age of Onset   Diabetes Mother    Diabetes Maternal Grandmother    Cancer Other    Cancer - Cervical Maternal Aunt    Diabetes Son    Breast cancer Other    Colon cancer Neg Hx    Colon polyps Neg Hx     Social History   Socioeconomic History   Marital status: Divorced    Spouse name:  Not on file   Number of children: Not on file   Years of education: Not on file   Highest education level: Not on file  Occupational History   Not on file  Tobacco Use   Smoking status: Former    Current packs/day: 0.00    Average packs/day: 0.3 packs/day for 1.5 years (0.4 ttl pk-yrs)    Types: Cigarettes    Start date: 01/02/2011    Quit date: 07/02/2012    Years since quitting: 11.3   Smokeless tobacco: Never  Vaping Use   Vaping status: Never Used  Substance and Sexual Activity   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  Eyes:  Negative for blurred vision and double vision.  Respiratory:  Negative for cough and shortness of breath.   Cardiovascular:  Negative for chest pain and palpitations.  Musculoskeletal:  Negative for joint pain and myalgias.  Neurological:  Negative for dizziness and headaches.  Psychiatric/Behavioral:  Negative for depression. The patient is not nervous/anxious.         Objective    BP 126/82   Pulse (!) 117   Temp (!) 97.4 F (36.3 C)   Ht 5\' 2"  (1.575 m)   Wt 211 lb (95.7 kg)   LMP 04/05/2017   SpO2 99%   BMI 38.59 kg/m   Physical Exam Constitutional:      Appearance: Normal appearance.  HENT:     Head: Normocephalic.     Mouth/Throat:     Mouth: Mucous membranes are moist.     Pharynx: Oropharynx is clear.  Eyes:     Extraocular Movements: Extraocular movements intact.     Conjunctiva/sclera:  Conjunctivae normal.  Cardiovascular:     Rate and Rhythm: Regular rhythm. Tachycardia present.     Heart sounds: Normal heart sounds. No murmur heard. Pulmonary:     Effort: Pulmonary effort is normal.  Skin:    General: Skin is warm and dry.  Neurological:     General: No focal deficit present.     Mental Status: She is alert and oriented to person, place, and time.  Psychiatric:        Mood and Affect: Mood normal.        Behavior: Behavior normal.        Assessment & Plan:  Encounter to establish care  Uncontrolled type 2 diabetes mellitus with hyperglycemia (HCC) Assessment & Plan: Stable. Continue current management.  Patient is on aspirin, ACEI, statin. Lipid panel and A1c ordered today. Increase dietary efforts and physical activity. Routine diabetic retinopathy screening: up-to-date. Foot exam and monofilament test done- next due in December.    Orders: -     Rosuvastatin Calcium; Take 1 tablet (20 mg total) by mouth daily.  Dispense: 90 tablet; Refill: 1 -     metFORMIN  HCl; Take 1 tablet (1,000 mg total) by mouth 2 (two) times daily with a meal.  Dispense: 180 tablet; Refill: 1 -     Losartan  Potassium; Take 1 tablet (25 mg total) by mouth daily.  Dispense: 90 tablet; Refill: 1 -     glipiZIDE  ER; Take 1 tablet (10 mg total) by mouth daily.  Dispense: 90 tablet; Refill: 1 -     Farxiga; Take 1 tablet (5 mg total) by mouth daily.  Dispense: 90 tablet; Refill: 1 -     Hemoglobin A1c -     CMP14+EGFR -     CBC with Differential/Platelet  Essential hypertension, benign Assessment & Plan: 126/82 Controlled. Continue current medications. No change in management. Discussed DASH diet and dietary sodium restrictions.  Increase dietary efforts and physical activity.   Orders: -     Losartan  Potassium; Take 1 tablet (25 mg total) by mouth daily.  Dispense: 90 tablet;  Refill: 1 -     CMP14+EGFR -     CBC with  Differential/Platelet  Hypercholesterolemia Assessment & Plan: Stable. Continue with current management without changes. Discussed healthy diet and lifestyle. Lipid panel today.   Orders: -     Rosuvastatin Calcium; Take 1 tablet (20 mg total) by mouth daily.  Dispense: 90 tablet; Refill: 1 -     Lipid panel  Encounter for screening mammogram for malignant neoplasm of breast -     3D Screening Mammogram, Left and Right  Other orders -     Omeprazole; Take 1 capsule (40 mg total) by mouth 2 (two) times daily before a meal.  Dispense: 90 capsule; Refill: 1 -     Metoprolol  Tartrate; Take 1 tablet (25 mg total) by mouth 2 (two) times daily.  Dispense: 90 tablet; Refill: 1    Return in about 4 months (around 03/08/2024) for phys with pap .   Jearlean Mince Alahia Whicker, PA-C

## 2023-11-06 NOTE — Assessment & Plan Note (Signed)
 Stable. Continue current management.  Patient is on aspirin, ACEI, statin. Lipid panel and A1c ordered today. Increase dietary efforts and physical activity. Routine diabetic retinopathy screening: up-to-date. Foot exam and monofilament test done- next due in December.

## 2023-12-02 ENCOUNTER — Ambulatory Visit: Payer: Self-pay | Admitting: Internal Medicine

## 2023-12-07 ENCOUNTER — Other Ambulatory Visit: Payer: Self-pay

## 2023-12-07 ENCOUNTER — Emergency Department (HOSPITAL_COMMUNITY)

## 2023-12-07 ENCOUNTER — Encounter (HOSPITAL_COMMUNITY): Payer: Self-pay | Admitting: *Deleted

## 2023-12-07 ENCOUNTER — Emergency Department (HOSPITAL_COMMUNITY)
Admission: EM | Admit: 2023-12-07 | Discharge: 2023-12-07 | Disposition: A | Discharge status: Home / Self Care | Attending: Emergency Medicine | Admitting: Emergency Medicine

## 2023-12-07 DIAGNOSIS — N3289 Other specified disorders of bladder: Secondary | ICD-10-CM | POA: Insufficient documentation

## 2023-12-07 DIAGNOSIS — Z7982 Long term (current) use of aspirin: Secondary | ICD-10-CM | POA: Diagnosis not present

## 2023-12-07 DIAGNOSIS — M1712 Unilateral primary osteoarthritis, left knee: Secondary | ICD-10-CM | POA: Diagnosis not present

## 2023-12-07 DIAGNOSIS — M25562 Pain in left knee: Secondary | ICD-10-CM

## 2023-12-07 MED ORDER — KETOROLAC TROMETHAMINE 30 MG/ML IJ SOLN
30.0000 mg | Freq: Once | INTRAMUSCULAR | Status: AC
  Administered 2023-12-07: 30 mg via INTRAMUSCULAR
  Filled 2023-12-07: qty 1

## 2023-12-07 MED ORDER — LORAZEPAM 1 MG PO TABS: 1.0000 mg | ORAL_TABLET | Freq: Once | ORAL | Status: DC

## 2023-12-07 NOTE — ED Notes (Signed)
 Pt in the hall. Pt asked if she needed anything. Pt asked to speak with this RN in the room. After entering the room patient stated " I want to speak with registration. Pt states they messed up my XRAY. The were suppose to do the left and they tried to do the right." Explained to patient that she would not be charged for the wrong knee XRAY because the XRAY tech told me that they were going to cancel the XRAY and notify the doctor. Explained to patient that registration could not help with canceling orders. Only a provider could change the order. This RN apologized to the patient about the misunderstanding. Explained to the patient that this RN was in the middle of helping another patient and after I finish with that patient then I would follow-up with the provider to see how we can move forward. Pt agreeable to plan.

## 2023-12-07 NOTE — ED Provider Notes (Signed)
 Kayla Wilkinson Provider Note   CSN: [id number] Arrival date & time: 12/07/23  0801     History  Chief Complaint  Patient presents with   Knee Pain    [name] is a 53 y.o. female.  Patient complains of pain to her left knee.  Patient reports that she fell a year ago.  Patient states that she was evaluated after the fall.  Patient states that she had a MRI done of her right leg.  Patient tells me that she thought she was having both knees MRI and because she struck both knees.  Patient states her right knee is not giving her any problems.  She reports her left knee is swollen and causing her discomfort.  Patient reports no relief of symptoms with naproxen  that she has been sent prescribed.  Patient reports knee now feels tight and she is having difficulty moving.  She has not had any fever or chills.  She has not had any new injuries.  Patient is concerned that her knee did not heal from previous injury due to her diabetes.  The history is provided by the patient. No language interpreter was used.  Knee Pain Associated symptoms: no fever        Home Medications Prior to Admission medications   Medication Sig Start Date End Date Taking? Authorizing Provider  ACCU-CHEK GUIDE TEST test strip 1 each by Other route as needed.    [provider]  aspirin EC 81 MG tablet Take 81 mg by mouth daily.    [provider]  FARXIGA  5 MG TABS tablet Take 1 tablet (5 mg total) by mouth daily. 11/06/23   Grooms, Courtney, PA-C  glipiZIDE  (GLUCOTROL  XL) 10 MG 24 hr tablet Take 1 tablet (10 mg total) by mouth daily. 11/06/23   Grooms, Courtney, PA-C  losartan  (COZAAR ) 25 MG tablet Take 1 tablet (25 mg total) by mouth daily. 11/06/23   Grooms, Courtney, PA-C  metFORMIN  (GLUCOPHAGE ) 1000 MG tablet Take 1 tablet (1,000 mg total) by mouth 2 (two) times daily with a meal. 11/06/23   Grooms, Franklin, PA-C  metoprolol  tartrate (LOPRESSOR ) 25  MG tablet Take 1 tablet (25 mg total) by mouth 2 (two) times daily. 11/06/23   Grooms, Courtney, PA-C  omeprazole  (PRILOSEC) 40 MG capsule Take 1 capsule (40 mg total) by mouth 2 (two) times daily before a meal. 11/06/23   Grooms, Courtney, PA-C  rosuvastatin  (CRESTOR ) 20 MG tablet Take 1 tablet (20 mg total) by mouth daily. 11/06/23   Grooms, Courtney, PA-C  valACYclovir  (VALTREX ) 1000 MG tablet Take 1 tablet (1,000 mg total) by mouth 2 (two) times daily. X 10 days, then once daily thereafter Patient taking differently: Take 1,000 mg by mouth 2 (two) times daily. 03/24/16   Ferd Householder, CNM      Allergies    Patient has no known allergies.    Review of Systems   Review of Systems  Constitutional:  Negative for fever.  Musculoskeletal:  Positive for joint swelling and myalgias.  All other systems reviewed and are negative.   Physical Exam Updated Vital Signs BP 135/70   Pulse 100   Temp 98.4 F (36.9 C)   Resp 18   Ht 5' 2.5" (1.588 m)   Wt 96.6 kg   LMP 04/05/2017   SpO2 96%   BMI 38.34 kg/m  Physical Exam Vitals reviewed.  Constitutional:      Appearance: Normal appearance.  Cardiovascular:  Rate and Rhythm: Normal rate.  Pulmonary:     Effort: Pulmonary effort is normal.  Musculoskeletal:        General: Swelling present.     Comments: Patient unable to tolerate palpation of left knee.  Refuses further evaluation.  Neurological:     General: No focal deficit present.     Mental Status: She is alert and oriented to person, place, and time.     ED Results / Procedures / Treatments   Labs (all labs ordered are listed, but only abnormal results are displayed) Labs Reviewed - No data to display  EKG None  Radiology DG Knee Complete 4 Views Left Result Date: 12/07/2023 CLINICAL DATA:  Left knee pain.  Chronic. EXAM: LEFT KNEE - COMPLETE 4+ VIEW COMPARISON:  None available. FINDINGS: Minimal peripheral medial and lateral compartment degenerative spurring without  significant joint space narrowing. Minimal superior greater than inferior patellar degenerative spurring. Small joint effusion. 2 mm likely 4 mm ossicles overlie the intercondylar notch on multiple views, possible tiny loose bodies. No acute fracture is seen. No dislocation. Vascular phleboliths overlie the superficial anterolateral proximal calf soft tissues. Mild diffuse subcutaneous fat edema, likely systemic. IMPRESSION: 1. Minimal medial and patellofemoral compartment osteoarthritis. 2. Small joint effusion. 3. Possible tiny loose bodies overlying the intercondylar notch. Electronically Signed   By: Bertina Broccoli M.D.   On: 12/07/2023 12:55    Procedures Procedures    Medications Ordered in ED Medications  LORazepam (ATIVAN) tablet 1 mg (has no administration in time range)  ketorolac  (TORADOL ) 30 MG/ML injection 30 mg (30 mg Intramuscular Given 12/07/23 1223)    ED Course/ Medical Decision Making/ A&P Clinical Course as of 12/07/23 1402  Mon Dec 07, 2023  1134 DG Knee Complete 4 Views Right [LS]    Clinical Course User Index [LS] Sandi Crosby, [place]                                 Medical Decision Making Patient complains of pain in her left knee.  Amount and/or Complexity of Data Reviewed Radiology: ordered. Decision-making details documented in ED Course.    Details: X-ray left knee shows medial and patellofemoral compartment arthritis, joint effusion to  Risk Prescription drug management.   (Pt upset that I ordered a right knee xray when sheI evaluated her at triage)  Pt has not had xray done. .I apologized for mistake.  Pt is wanting to have an MRI.  I spoke with [facility] and they can work her in today. Radology will not be able to read today.  Pt placed in knee immobilizer.  Pt is given a note for out of work.  Pt does not want narcotic pain medication.  Pt is given an injection of toradol .  Pt reports she still has pain but was able to take a nap        Final Clinical  Impression(s) / ED Diagnoses Final diagnoses:  Acute pain of left knee  Osteoarthritis of left knee, unspecified osteoarthritis type  Bowel bladder  Rx / DC Orders ED Discharge Orders     None         Evelyn Hire 12/07/23 1748    Iva Mariner, MD 12/08/23 [phone]

## 2023-12-07 NOTE — ED Notes (Signed)
 Pt updated about waiting for MRI. Explained that she has a few people in front of her but it shouldn't be too much longer. Explained to patient that her head will not go into the MRI scanner per the MRI tech and that she will not need the sedation medication. Pt verbalized understanding and agreeable to the plan.

## 2023-12-07 NOTE — ED Provider Triage Note (Signed)
 Emergency Medicine Provider Triage Evaluation Note  Kayla Wilkinson , a 53 y.o. female  was evaluated in triage.  Pt complains of left knee pain.  Pt thinks she needs an mri  Review of Systems  Positive: Pain left knee Negative: Fever or redness  Physical Exam  BP (!) 150/83 (BP Location: Right Arm)   Pulse (!) 109   Temp 98 F (36.7 C) (Oral)   Resp 18   Ht 5' 2.5" (1.588 m)   Wt 96.6 kg   LMP 04/05/2017   SpO2 97%   BMI 38.34 kg/m  Gen:   Awake, no distress   Resp:  Normal effort  MSK:   Unable to exam,  Pt pushes my hand away Other:    Medical Decision Making  Medically screening exam initiated at 9:53 AM.  Appropriate orders placed.  Billi A Wilkinson was informed that the remainder of the evaluation will be completed by another provider, this initial triage assessment does not replace that evaluation, and the importance of remaining in the ED until their evaluation is complete.     Sandi Crosby, PA-C 12/07/23 [phone]

## 2023-12-07 NOTE — Discharge Instructions (Addendum)
Return if any problems.  See the Orthopaedist for evaluation  °

## 2023-12-07 NOTE — ED Notes (Signed)
 Pt went to MRI. Pt belonging with with her

## 2023-12-07 NOTE — ED Triage Notes (Signed)
 Pt reports since last Thursday her left knee has been swelling, feeling very tight, and difficulty bending it. Pt reports fall about a year ago over a tree stump causing her to fall onto both of her knees, but mainly on her left knee. Pt reports she was seen here at that time and did an x-ray of her knee but told there was nothing broken. Pt concerned the pain today may be related to her fall last year.

## 2023-12-07 NOTE — ED Notes (Signed)
 Sofia, Leslie K, PA-C and this RN at bedside. Pt yelling at [place] about ordering the wrong XRAY. This RN and [place] PA apologized for the misunderstanding. New plan created with the patient to meet her needs. Explained to patient that the process will take time with all of the testing. Patient agreeable to plan.

## 2023-12-10 ENCOUNTER — Ambulatory Visit: Admitting: Orthopedic Surgery

## 2023-12-10 ENCOUNTER — Encounter: Payer: Self-pay | Admitting: Orthopedic Surgery

## 2023-12-10 VITALS — BP 148/83 | HR 118 | Ht 62.5 in | Wt 211.0 lb

## 2023-12-10 DIAGNOSIS — M25562 Pain in left knee: Secondary | ICD-10-CM | POA: Diagnosis not present

## 2023-12-10 DIAGNOSIS — G8929 Other chronic pain: Secondary | ICD-10-CM | POA: Diagnosis not present

## 2023-12-10 NOTE — Progress Notes (Signed)
 New Patient Visit  Assessment: Kayla Wilkinson is a 53 y.o. female with the following: Left knee pain; advanced degenerative changes in the patellofemoral compartment, moderate within the medial compartment  Plan: Kayla Wilkinson has chronic left knee pain.  She states that it is been progressively worsening over the past 18 months.  This started after fall.  She recently obtained an MRI in the emergency department, which demonstrates some degenerative meniscus tears, as well as advanced degenerative changes in the patellofemoral joint, and moderate degenerative changes in the medial compartment.  Thus far, she has tried medications.  No injections.  No exercises.  We discussed injections, and she elected to proceed.  A steroid injection was completed in clinic today.  She will return to clinic as needed.  I have also provided her with some home exercises to initiate.  Procedure note injection Left knee joint   Verbal consent was obtained to inject the left knee joint  Timeout was completed to confirm the site of injection.  The skin was prepped with alcohol and ethyl chloride was sprayed at the injection site.  A 21-gauge needle was used to inject 40 mg of Depo-Medrol  and 1% lidocaine  (4 cc) into the left knee using an anterolateral approach.  There were no complications. A sterile bandage was applied.     Follow-up: Return if symptoms worsen or fail to improve.  Subjective:  Chief Complaint  Patient presents with   Knee Pain    L after a fall 18 mos ago and it's getting worse. Pt states she is getting worse and has a lot of swelling.     History of Present Illness: Kayla Wilkinson is a 53 y.o. female who presents for evaluation of left knee pain.  She states that she has had pain in the left knee for the past 18 months.  She fell onto a tree root, injuring both knees about 18 months ago.  She states that she was evaluated the emergency department.  However,  x-rays and subsequently an MRI was obtained for the right knee.  She has not had a workup for her left knee.  The pain is gotten worse over the past couple of weeks.  She presented to the emergency department a few days ago, and had an MRI.  She is here to discuss the findings.  She has not worked with physical therapy.  She has been reluctant to range the left knee.  She has been seeing medications, without sustained improvement.   Review of Systems: No fevers or chills No numbness or tingling No chest pain No shortness of breath No bowel or bladder dysfunction No GI distress No headaches   Medical History:  Past Medical History:  Diagnosis Date   Acid reflux    Chest pain    a. Coronary CT in 08/2019 showing calcium  score of 0   Diabetes mellitus    Hypertension     Past Surgical History:  Procedure Laterality Date   BIOPSY  10/16/2020   Procedure: BIOPSY;  Surgeon: Vinetta Greening, DO;  Location: [facility];  Service: Endoscopy;;   BREAST BIOPSY Right 06/01/2019   Procedure: EXCISIONAL BREAST BIOPSY WITH NEEDLE LOCALIZATION;  Surgeon: Awilda Bogus, MD;  Location: [facility];  Service: General;  Laterality: Right;   CARDIAC CATHETERIZATION  2020   COLONOSCOPY WITH PROPOFOL  N/A 10/16/2020   Procedure: COLONOSCOPY WITH PROPOFOL ;  Surgeon: Vinetta Greening, DO;  Location: [facility];  Service: Endoscopy;  Laterality:  N/A;  AM (diabetic)   ESOPHAGOGASTRODUODENOSCOPY (EGD) WITH PROPOFOL  N/A 10/16/2020   Procedure: ESOPHAGOGASTRODUODENOSCOPY (EGD) WITH PROPOFOL ;  Surgeon: Vinetta Greening, DO;  Location: [facility];  Service: Endoscopy;  Laterality: N/A;   RECTOCELE REPAIR N/A 07/08/2016   Procedure: POSTERIOR REPAIR (RECTOCELE);  Surgeon: Albino Hum, MD;  Location: [facility];  Service: Gynecology;  Laterality: N/A;    Family History  Problem Relation Age of Onset   Diabetes Mother    Diabetes Maternal Grandmother    Cancer Other    Cancer - Cervical Maternal Aunt     Diabetes Son    Breast cancer Other    Colon cancer Neg Hx    Colon polyps Neg Hx    Social History   Tobacco Use   Smoking status: Former    Current packs/day: 0.00    Average packs/day: 0.3 packs/day for 1.5 years (0.4 ttl pk-yrs)    Types: Cigarettes    Start date: 01/02/2011    Quit date: 07/02/2012    Years since quitting: 11.4   Smokeless tobacco: Never  Vaping Use   Vaping status: Never Used  Substance Use Topics   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   aspirin EC 81 MG tablet Take 81 mg by mouth daily.   FARXIGA  5 MG TABS tablet Take 1 tablet (5 mg total) by mouth daily.   glipiZIDE  (GLUCOTROL  XL) 10 MG 24 hr tablet Take 1 tablet (10 mg total) by mouth daily.   losartan  (COZAAR ) 25 MG tablet Take 1 tablet (25 mg total) by mouth daily.   metFORMIN  (GLUCOPHAGE ) 1000 MG tablet Take 1 tablet (1,000 mg total) by mouth 2 (two) times daily with a meal.   metoprolol  tartrate (LOPRESSOR ) 25 MG tablet Take 1 tablet (25 mg total) by mouth 2 (two) times daily.   omeprazole  (PRILOSEC) 40 MG capsule Take 1 capsule (40 mg total) by mouth 2 (two) times daily before a meal.   rosuvastatin  (CRESTOR ) 20 MG tablet Take 1 tablet (20 mg total) by mouth daily.   valACYclovir  (VALTREX ) 1000 MG tablet Take 1 tablet (1,000 mg total) by mouth 2 (two) times daily. X 10 days, then once daily thereafter (Patient taking differently: Take 1,000 mg by mouth 2 (two) times daily.)    Objective: BP (!) 148/83   Pulse (!) 118   Ht 5' 2.5" (1.588 m)   Wt 211 lb (95.7 kg)   LMP 04/05/2017   BMI 37.98 kg/m   Physical Exam:  General: Alert and oriented. and No acute distress. Gait: Left sided antalgic gait.  Left knee with a mild effusion.  Mild swelling.  No bruising.  Knee is stable to varus and valgus stress.  Tenderness  palpation over the medial and lateral joint line.  Crepitus with range of motion testing.  Negative Lachman.  IMAGING: I personally reviewed images previously obtained from the ED  Left knee MRI  Impression:  1. Moderate intrasubstance degeneration throughout the posterior horn and posterior segment of the body of the medial meniscus. There is a subtle and likely chronic/degenerative oblique tear extending through the inferior articular surface of the middle third of the meniscal triangle of the medial aspect of the posterior horn of the medial meniscus. There is mild to moderate  attenuation of the inferior aspect of the posterior segment of the body of the medial meniscus with multiple chronic/degenerative appearing tears extending through the inferior articular surface of the middle to peripheral thirds of the meniscal triangle. 2. Predominantly full-thickness cartilage loss within the mid height of the medial patellar facet in a region measuring up to approximately 11 mm in transverse dimension and 11 mm in craniocaudal dimension. Moderate thinning of the superior trochlear notch cartilage. High-grade partial-thickness cartilage loss of the superomedial aspect of the medial trochlear cartilage. 3. Moderate thinning of the weight-bearing medial femoral condyle cartilage. 4. Small joint effusion. 5. Mild to moderate Baker's cyst.   New Medications:  No orders of the defined types were placed in this encounter.     Tonita Frater, MD  12/10/2023 3:35 PM

## 2023-12-10 NOTE — Patient Instructions (Addendum)
Instructions Following Joint Injections  In clinic today, you received an injection in one of your joints (sometimes more than one).  Occasionally, you can have some pain at the injection site, this is normal.  You can place ice at the injection site, or take over-the-counter medications such as Tylenol (acetaminophen) or Advil (ibuprofen).  Please follow all directions listed on the bottle.  If your joint (knee or shoulder) becomes swollen, red or very painful, please contact the clinic for additional assistance.   Two medications were injected, including lidocaine and a steroid (often referred to as cortisone).  Lidocaine is effective almost immediately but wears off quickly.  However, the steroid can take a few days to improve your symptoms.  In some cases, it can make your pain worse for a couple of days.  Do not be concerned if this happens as it is common.  You can apply ice or take some over-the-counter medications as needed.   Injections in the same joint cannot be repeated for 3 months.  This helps to limit the risk of an infection in the joint.  If you were to develop an infection in your joint, the best treatment option would be surgery.      Knee Exercises  Ask your health care provider which exercises are safe for you. Do exercises exactly as told by your health care provider and adjust them as directed. It is normal to feel mild stretching, pulling, tightness, or discomfort as you do these exercises. Stop right away if you feel sudden pain or your pain gets worse. Do not begin these exercises until told by your health care provider.  Stretching and range-of-motion exercises These exercises warm up your muscles and joints and improve the movement and flexibility of your knee. These exercises also help to relieve pain and swelling.  Knee extension, prone Lie on your abdomen (prone position) on a bed. Place your left / right knee just beyond the edge of the surface so your knee is  not on the bed. You can put a towel under your left / right thigh just above your kneecap for comfort. Relax your leg muscles and allow gravity to straighten your knee (extension). You should feel a stretch behind your left / right knee. Hold this position for 10 seconds. Scoot up so your knee is supported between repetitions. Repeat 10 times. Complete this exercise 3-4 times per week.     Knee flexion, active Lie on your back with both legs straight. If this causes back discomfort, bend your left / right knee so your foot is flat on the floor. Slowly slide your left / right heel back toward your buttocks. Stop when you feel a gentle stretch in the front of your knee or thigh (flexion). Hold this position for 10 seconds. Slowly slide your left / right heel back to the starting position. Repeat 10 times. Complete this exercise 3-4 times per week.      Quadriceps stretch, prone Lie on your abdomen on a firm surface, such as a bed or padded floor. Bend your left / right knee and hold your ankle. If you cannot reach your ankle or pant leg, loop a belt around your foot and grab the belt instead. Gently pull your heel toward your buttocks. Your knee should not slide out to the side. You should feel a stretch in the front of your thigh and knee (quadriceps). Hold this position for 10 seconds. Repeat 10 times. Complete this exercise 3-4 times per week.  Hamstring, supine Lie on your back (supine position). Loop a belt or towel over the ball of your left / right foot. The ball of your foot is on the walking surface, right under your toes. Straighten your left / right knee and slowly pull on the belt to raise your leg until you feel a gentle stretch behind your knee (hamstring). Do not let your knee bend while you do this. Keep your other leg flat on the floor. Hold this position for 10 seconds. Repeat 10 times. Complete this exercise 3-4 times per week.   Strengthening exercises These  exercises build strength and endurance in your knee. Endurance is the ability to use your muscles for a long time, even after they get tired.  Quadriceps, isometric This exercise stretches the muscles in front of your thigh (quadriceps) without moving your knee joint (isometric). Lie on your back with your left / right leg extended and your other knee bent. Put a rolled towel or small pillow under your knee if told by your health care provider. Slowly tense the muscles in the front of your left / right thigh. You should see your kneecap slide up toward your hip or see increased dimpling just above the knee. This motion will push the back of the knee toward the floor. For 10 seconds, hold the muscle as tight as you can without increasing your pain. Relax the muscles slowly and completely. Repeat 10 times. Complete this exercise 3-4 times per week. .     Straight leg raises This exercise stretches the muscles in front of your thigh (quadriceps) and the muscles that move your hips (hip flexors). Lie on your back with your left / right leg extended and your other knee bent. Tense the muscles in the front of your left / right thigh. You should see your kneecap slide up or see increased dimpling just above the knee. Your thigh may even shake a bit. Keep these muscles tight as you raise your leg 4-6 inches (10-15 cm) off the floor. Do not let your knee bend. Hold this position for 10 seconds. Keep these muscles tense as you lower your leg. Relax your muscles slowly and completely after each repetition. Repeat 10 times. Complete this exercise 3-4 times per week.  Hamstring, isometric Lie on your back on a firm surface. Bend your left / right knee about 30 degrees. Dig your left / right heel into the surface as if you are trying to pull it toward your buttocks. Tighten the muscles in the back of your thighs (hamstring) to "dig" as hard as you can without increasing any pain. Hold this position for 10  seconds. Release the tension gradually and allow your muscles to relax completely for __________ seconds after each repetition. Repeat 10 times. Complete this exercise 3-4 times per week.  Hamstring curls If told by your health care provider, do this exercise while wearing ankle weights. Begin with 5 lb weights. Then increase the weight by 1 lb (0.5 kg) increments. You can also use an exercise band Lie on your abdomen with your legs straight. Bend your left / right knee as far as you can without feeling pain. Keep your hips flat against the floor. Hold this position for 10 seconds. Slowly lower your leg to the starting position. Repeat 10 times. Complete this exercise 3-4 times per week.      Squats This exercise strengthens the muscles in front of your thigh and knee (quadriceps). Stand in front of a table,  with your feet and knees pointing straight ahead. You may rest your hands on the table for balance but not for support. Slowly bend your knees and lower your hips like you are going to sit in a chair. Keep your weight over your heels, not over your toes. Keep your lower legs upright so they are parallel with the table legs. Do not let your hips go lower than your knees. Do not bend lower than told by your health care provider. If your knee pain increases, do not bend as low. Hold the squat position for 10 seconds. Slowly push with your legs to return to standing. Do not use your hands to pull yourself to standing. Repeat 10 times. Complete this exercise 3-4 times per week .     Wall slides This exercise strengthens the muscles in front of your thigh and knee (quadriceps). Lean your back against a smooth wall or door, and walk your feet out 18-24 inches (46-61 cm) from it. Place your feet hip-width apart. Slowly slide down the wall or door until your knees bend 90 degrees. Keep your knees over your heels, not over your toes. Keep your knees in line with your hips. Hold this  position for 10 seconds. Repeat 10 times. Complete this exercise 3-4 times per week.      Straight leg raises This exercise strengthens the muscles that rotate the leg at the hip and move it away from your body (hip abductors). Lie on your side with your left / right leg in the top position. Lie so your head, shoulder, knee, and hip line up. You may bend your bottom knee to help you keep your balance. Roll your hips slightly forward so your hips are stacked directly over each other and your left / right knee is facing forward. Leading with your heel, lift your top leg 4-6 inches (10-15 cm). You should feel the muscles in your outer hip lifting. Do not let your foot drift forward. Do not let your knee roll toward the ceiling. Hold this position for 10 seconds. Slowly return your leg to the starting position. Let your muscles relax completely after each repetition. Repeat 10 times. Complete this exercise 3-4 times per week.      Straight leg raises This exercise stretches the muscles that move your hips away from the front of the pelvis (hip extensors). Lie on your abdomen on a firm surface. You can put a pillow under your hips if that is more comfortable. Tense the muscles in your buttocks and lift your left / right leg about 4-6 inches (10-15 cm). Keep your knee straight as you lift your leg. Hold this position for 10 seconds. Slowly lower your leg to the starting position. Let your leg relax completely after each repetition. Repeat 10 times. Complete this exercise 3-4 times per week.

## 2023-12-16 ENCOUNTER — Telehealth: Payer: Self-pay | Admitting: Orthopedic Surgery

## 2023-12-16 NOTE — Telephone Encounter (Signed)
 Dr. Ernesta Heading pt - pt lvm stating that she was seen on 12/10/23 and got an injection in her lt knee.  She stated it has helped, she can walk better and it helped underneath the knee cap, but to the right of the left knee it's aching and hurting.  She stating it feels like someone is digging in there or like it's cut.  She stated the pain is ugly.  She wants to know what the next step is.  [phone]

## 2024-01-15 ENCOUNTER — Telehealth: Payer: Self-pay | Admitting: Pharmacy Technician

## 2024-01-15 ENCOUNTER — Other Ambulatory Visit (HOSPITAL_COMMUNITY): Payer: Self-pay

## 2024-01-15 NOTE — Telephone Encounter (Signed)
 Pharmacy Patient Advocate Encounter   Received notification from CoverMyMeds that prior authorization for Farxiga  5MG  tablets is required/requested.   Insurance verification completed.   The patient is insured through [employer] .   Per test claim: PA required; PA submitted to above mentioned insurance via Prompt PA Key/confirmation #/[id number] Status is pending

## 2024-01-18 ENCOUNTER — Other Ambulatory Visit (HOSPITAL_COMMUNITY): Payer: Self-pay

## 2024-01-18 NOTE — Telephone Encounter (Signed)
 Pharmacy Patient Advocate Encounter  Received notification from [employer] that Prior Authorization for Farxiga  5MG  tablets  has been APPROVED from 01/15/2024 to 01/13/2026. Unable to obtain price due to refill too soon rejection, last fill date 01/17/2024 next available fill date08/10/2023.   PA #/Case ID/Reference #: [id number]

## 2024-02-17 ENCOUNTER — Ambulatory Visit (HOSPITAL_COMMUNITY)

## 2024-02-29 ENCOUNTER — Ambulatory Visit (HOSPITAL_COMMUNITY)

## 2024-03-08 ENCOUNTER — Ambulatory Visit (INDEPENDENT_AMBULATORY_CARE_PROVIDER_SITE_OTHER): Admitting: Physician Assistant

## 2024-03-08 ENCOUNTER — Encounter: Payer: Self-pay | Admitting: Physician Assistant

## 2024-03-08 VITALS — BP 138/88 | HR 94 | Temp 98.2°F | Ht 62.5 in | Wt 213.0 lb

## 2024-03-08 DIAGNOSIS — Z0000 Encounter for general adult medical examination without abnormal findings: Secondary | ICD-10-CM

## 2024-03-08 DIAGNOSIS — E1165 Type 2 diabetes mellitus with hyperglycemia: Secondary | ICD-10-CM | POA: Diagnosis not present

## 2024-03-08 DIAGNOSIS — Z0001 Encounter for general adult medical examination with abnormal findings: Secondary | ICD-10-CM

## 2024-03-08 DIAGNOSIS — Z1151 Encounter for screening for human papillomavirus (HPV): Secondary | ICD-10-CM

## 2024-03-08 DIAGNOSIS — Z7984 Long term (current) use of oral hypoglycemic drugs: Secondary | ICD-10-CM | POA: Diagnosis not present

## 2024-03-08 DIAGNOSIS — Z124 Encounter for screening for malignant neoplasm of cervix: Secondary | ICD-10-CM

## 2024-03-08 MED ORDER — VALACYCLOVIR HCL 1 G PO TABS: 1000.0000 mg | ORAL_TABLET | Freq: Every day | ORAL | 1 refills | Status: AC

## 2024-03-08 MED ORDER — TIRZEPATIDE 7.5 MG/0.5ML ~~LOC~~ SOAJ: 7.5000 mg | SUBCUTANEOUS | 1 refills | Status: AC

## 2024-03-08 MED ORDER — ROSUVASTATIN CALCIUM 20 MG PO TABS: 20.0000 mg | ORAL_TABLET | Freq: Every day | ORAL | 1 refills | Status: AC

## 2024-03-08 MED ORDER — FARXIGA 5 MG PO TABS: 5.0000 mg | ORAL_TABLET | Freq: Every day | ORAL | 1 refills | Status: AC

## 2024-03-08 MED ORDER — METFORMIN HCL 1000 MG PO TABS: 1000.0000 mg | ORAL_TABLET | Freq: Two times a day (BID) | ORAL | 1 refills | Status: AC

## 2024-03-08 MED ORDER — LOSARTAN POTASSIUM 25 MG PO TABS: 25.0000 mg | ORAL_TABLET | Freq: Every day | ORAL | 1 refills | Status: AC

## 2024-03-08 MED ORDER — METOPROLOL TARTRATE 25 MG PO TABS: 25.0000 mg | ORAL_TABLET | Freq: Two times a day (BID) | ORAL | 1 refills | Status: AC

## 2024-03-08 MED ORDER — GLIPIZIDE ER 10 MG PO TB24: 10.0000 mg | ORAL_TABLET | Freq: Every day | ORAL | 1 refills | Status: AC

## 2024-03-08 MED ORDER — OMEPRAZOLE 40 MG PO CPDR: 40.0000 mg | DELAYED_RELEASE_CAPSULE | Freq: Two times a day (BID) | ORAL | 1 refills | Status: AC

## 2024-03-08 NOTE — Progress Notes (Signed)
 Complete physical exam  Patient: Kayla Wilkinson   DOB: 1971/03/02   53 y.o. Female  MRN: [id number]  Subjective:    Chief Complaint  Patient presents with   Annual Exam    W/ pap  refills    Kayla Wilkinson is a 53 y.o. female who presents today for a complete physical exam. She reports consuming a general diet. She reports walking frequently for exercise.  She generally feels well. She reports sleeping well. She does not have additional problems to discuss today.    Most recent fall risk assessment:    07/15/2017    9:21 AM  Fall Risk   Falls in the past year? No      Data saved with a previous flowsheet row definition     Most recent depression screenings:    11/06/2023    3:38 PM 07/15/2017    9:21 AM  PHQ 2/9 Scores  PHQ - 2 Score 0 0  PHQ- 9 Score 0     Vision:Within last year and Dental: No current dental problems and Receives regular dental care  Patient Care Team: Ravan Schlemmer, [place], [place] as PCP - General (Physician Assistant) Okey Vina GAILS, [place] as PCP - Cardiology (Cardiology) Cindie Carlin POUR, DO as Consulting Physician (Internal Medicine)   Outpatient Medications Prior to Visit  Medication Sig   ACCU-CHEK GUIDE TEST test strip 1 each by Other route as needed.   aspirin EC 81 MG tablet Take 81 mg by mouth daily.   [DISCONTINUED] FARXIGA  5 MG TABS tablet Take 1 tablet (5 mg total) by mouth daily.   [DISCONTINUED] glipiZIDE  (GLUCOTROL  XL) 10 MG 24 hr tablet Take 1 tablet (10 mg total) by mouth daily.   [DISCONTINUED] losartan  (COZAAR ) 25 MG tablet Take 1 tablet (25 mg total) by mouth daily.   [DISCONTINUED] metFORMIN  (GLUCOPHAGE ) 1000 MG tablet Take 1 tablet (1,000 mg total) by mouth 2 (two) times daily with a meal.   [DISCONTINUED] metoprolol  tartrate (LOPRESSOR ) 25 MG tablet Take 1 tablet (25 mg total) by mouth 2 (two) times daily.   [DISCONTINUED] MOUNJARO  5 MG/0.5ML Pen Inject 5 mg into the skin once a week.   [DISCONTINUED] omeprazole   (PRILOSEC) 40 MG capsule Take 1 capsule (40 mg total) by mouth 2 (two) times daily before a meal.   [DISCONTINUED] rosuvastatin  (CRESTOR ) 20 MG tablet Take 1 tablet (20 mg total) by mouth daily.   [DISCONTINUED] valACYclovir  (VALTREX ) 1000 MG tablet Take 1 tablet (1,000 mg total) by mouth 2 (two) times daily. X 10 days, then once daily thereafter (Patient taking differently: Take 1,000 mg by mouth 2 (two) times daily.)   No facility-administered medications prior to visit.    Review of Systems  Constitutional:  Negative for chills, fever and malaise/fatigue.  Eyes:  Negative for blurred vision and double vision.  Respiratory:  Negative for cough and shortness of breath.   Cardiovascular:  Negative for chest pain and palpitations.  Musculoskeletal:  Positive for joint pain. Negative for myalgias.  Neurological:  Negative for dizziness and headaches.  Psychiatric/Behavioral:  Negative for depression. The patient is not nervous/anxious.       Objective:     BP 138/88   Pulse 94   Temp 98.2 F (36.8 C)   Ht 5' 2.5 (1.588 m)   Wt 213 lb (96.6 kg)   LMP 04/05/2017   SpO2 96%   BMI 38.34 kg/m   Physical Exam Constitutional:      Appearance: Normal appearance. She is  obese.  HENT:     Head: Normocephalic.     Mouth/Throat:     Mouth: Mucous membranes are moist.     Pharynx: Oropharynx is clear.  Eyes:     Extraocular Movements: Extraocular movements intact.     Conjunctiva/sclera: Conjunctivae normal.  Cardiovascular:     Rate and Rhythm: Normal rate and regular rhythm.     Heart sounds: Normal heart sounds. No murmur heard. Pulmonary:     Effort: Pulmonary effort is normal.     Breath sounds: Normal breath sounds. No wheezing or rales.  Genitourinary:    General: Normal vulva.     Labia:        Right: No rash or tenderness.        Left: No rash or tenderness.      Vagina: No signs of injury. Vaginal discharge present. No erythema or bleeding.     Cervix: Lesion  (multiple red/purple lesions noted nears/around cervix) present. No cervical motion tenderness, discharge, friability, erythema or cervical bleeding.     Comments: Chaperone deferred Musculoskeletal:     Right lower leg: No edema.     Left lower leg: No edema.  Skin:    General: Skin is warm and dry.  Neurological:     General: No focal deficit present.     Mental Status: She is alert and oriented to person, place, and time.  Psychiatric:        Mood and Affect: Mood normal.        Behavior: Behavior normal.      No results found for any visits on 03/08/24.    Assessment & Plan:    Routine Health Maintenance and Physical Exam  Health Maintenance  Topic Date Due   Complete foot exam   Never done   Eye exam for diabetics  Never done   Yearly kidney health urinalysis for diabetes  Never done   Hepatitis C Screening  Never done   DTaP/Tdap/Td vaccine (1 - Tdap) Never done   Pneumococcal Vaccine for age over 55 (1 of 2 - PCV) Never done   Hepatitis B Vaccine (1 of 3 - 19+ 3-dose series) Never done   Pap with HPV screening  10/16/2017   Mammogram  Never done   Zoster (Shingles) Vaccine (1 of 2) Never done   Hemoglobin A1C  11/27/2019   Yearly kidney function blood test for diabetes  10/12/2021   Flu Shot  02/05/2024   COVID-19 Vaccine (4 - 2025-26 season) 03/07/2024   Colon Cancer Screening  10/17/2030   HIV Screening  Completed   HPV Vaccine  Aged Out   Meningitis B Vaccine  Aged Out    Discussed health benefits of physical activity, and encouraged her to engage in regular exercise appropriate for her age and condition.  Problem List Items Addressed This Visit     Uncontrolled type 2 diabetes mellitus with hyperglycemia (HCC) - Primary   Relevant Medications   FARXIGA  5 MG TABS tablet   glipiZIDE  (GLUCOTROL  XL) 10 MG 24 hr tablet   losartan  (COZAAR ) 25 MG tablet   metFORMIN  (GLUCOPHAGE ) 1000 MG tablet   rosuvastatin  (CRESTOR ) 20 MG tablet   tirzepatide  (MOUNJARO ) 7.5  MG/0.5ML Pen   Other Relevant Orders   Microalbumin / creatinine urine ratio   Other Visit Diagnoses       Annual visit for general adult medical examination without abnormal findings         Screening for cervical cancer  Relevant Orders   IGP, Aptima HPV     Screening for human papillomavirus (HPV)       Relevant Orders   IGP, Aptima HPV      Return in about 6 months (around 09/05/2024) for diabetes, bp, cholesterol .  Safety measures discussed. Immunizations reviewed: advised yearly flu vaccine, discussed tetanus, pneumonia, and shingles vaccines.  Diet and exercise/ lifestyle modifications discussed. Recommend 150 minutes per week of exercise such as walking. Recommend lots of fresh produce to include fruits, vegetables, beans, healthy fats such as avocado, nuts, seeds, and 3-6 ounces of protein at each meal.  Avoid fried foods and fast food. Limit alcohol consumption: no more than one drink per day for women and 2 drinks per day for men.  Stress management discussed. Routine vision and dental screening discussed: recommend dentist every 6 months, gets vision checked every 1-2 years.  Health maintenance: Pap done today. Mammogram scheduled next week. Has upcoming eye appointment.  Advised patient to stop for previously ordered lab work.   Questions answered.        Charmaine Mckinze Poirier, PA-C

## 2024-03-09 LAB — CBC WITH DIFFERENTIAL/PLATELET
Basophils Absolute: 0 x10E3/uL (ref 0.0–0.2)
Basos: 1 %
EOS (ABSOLUTE): 0.2 x10E3/uL (ref 0.0–0.4)
Eos: 4 %
Hematocrit: 44.9 % (ref 34.0–46.6)
Hemoglobin: 14.4 g/dL (ref 11.1–15.9)
Immature Grans (Abs): 0 x10E3/uL (ref 0.0–0.1)
Immature Granulocytes: 0 %
Lymphocytes Absolute: 1.9 x10E3/uL (ref 0.7–3.1)
Lymphs: 31 %
MCH: 29.9 pg (ref 26.6–33.0)
MCHC: 32.1 g/dL (ref 31.5–35.7)
MCV: 93 fL (ref 79–97)
Monocytes Absolute: 0.5 x10E3/uL (ref 0.1–0.9)
Monocytes: 8 %
Neutrophils Absolute: 3.5 x10E3/uL (ref 1.4–7.0)
Neutrophils: 56 %
Platelets: 247 x10E3/uL (ref 150–450)
RBC: 4.82 x10E6/uL (ref 3.77–5.28)
RDW: 12.5 % (ref 11.7–15.4)
WBC: 6 x10E3/uL (ref 3.4–10.8)

## 2024-03-09 LAB — CMP14+EGFR
ALT: 28 IU/L (ref 0–32)
AST: 22 IU/L (ref 0–40)
Albumin: 4.3 g/dL (ref 3.8–4.9)
Alkaline Phosphatase: 146 IU/L — ABNORMAL HIGH (ref 44–121)
BUN/Creatinine Ratio: 26 — ABNORMAL HIGH (ref 9–23)
BUN: 13 mg/dL (ref 6–24)
Bilirubin Total: 0.4 mg/dL (ref 0.0–1.2)
CO2: 20 mmol/L (ref 20–29)
Calcium: 9.3 mg/dL (ref 8.7–10.2)
Chloride: 98 mmol/L (ref 96–106)
Creatinine, Ser: 0.5 mg/dL — ABNORMAL LOW (ref 0.57–1.00)
Globulin, Total: 2.8 g/dL (ref 1.5–4.5)
Glucose: 383 mg/dL — ABNORMAL HIGH (ref 70–99)
Potassium: 4.3 mmol/L (ref 3.5–5.2)
Sodium: 133 mmol/L — ABNORMAL LOW (ref 134–144)
Total Protein: 7.1 g/dL (ref 6.0–8.5)
eGFR: 111 mL/min/1.73 (ref >59)

## 2024-03-09 LAB — LIPID PANEL
Chol/HDL Ratio: 3.6 ratio (ref 0.0–4.4)
Cholesterol, Total: 206 mg/dL — ABNORMAL HIGH (ref 100–199)
HDL: 57 mg/dL (ref >39)
LDL Chol Calc (NIH): 111 mg/dL — ABNORMAL HIGH (ref 0–99)
Triglycerides: 218 mg/dL — ABNORMAL HIGH (ref 0–149)
VLDL Cholesterol Cal: 38 mg/dL (ref 5–40)

## 2024-03-09 LAB — MICROALBUMIN / CREATININE URINE RATIO
Creatinine, Urine: 30.2 mg/dL
Microalb/Creat Ratio: 740 mg/g{creat} — ABNORMAL HIGH (ref 0–29)
Microalbumin, Urine: 223.4 ug/mL

## 2024-03-09 LAB — HEMOGLOBIN A1C
Est. average glucose Bld gHb Est-mCnc: 289 mg/dL
Hgb A1c MFr Bld: 11.7 % — ABNORMAL HIGH (ref 4.8–5.6)

## 2024-03-09 LAB — SPECIMEN STATUS REPORT

## 2024-03-10 ENCOUNTER — Ambulatory Visit: Payer: Self-pay | Admitting: Physician Assistant

## 2024-03-10 LAB — IGP, APTIMA HPV: HPV Aptima: NEGATIVE

## 2024-03-10 LAB — SPECIMEN STATUS REPORT

## 2024-03-10 NOTE — Telephone Encounter (Signed)
 Tried to contact patient but no answer or voicemail.  Mychart message sent for patient to call and schedule appointment.

## 2024-03-15 ENCOUNTER — Other Ambulatory Visit: Payer: Self-pay | Admitting: Physician Assistant

## 2024-03-15 DIAGNOSIS — E1165 Type 2 diabetes mellitus with hyperglycemia: Secondary | ICD-10-CM

## 2024-03-15 MED ORDER — INSULIN GLARGINE-YFGN 100 UNIT/ML ~~LOC~~ SOLN: 20.0000 [IU] | Freq: Every day | SUBCUTANEOUS | 2 refills | Status: DC

## 2024-03-15 NOTE — Telephone Encounter (Signed)
 Hi Courtney , I spoke with one of her sons yesterday and asked for them to leave the pt a message. She called and left this message for you. She would be willing to re-start on lantus  as she has taken it before.    Copied from CRM [id number]. Topic: Clinical - Lab/Test Results >> Mar 15, 2024  8:25 AM Carlatta H wrote: Reason for CRM: Patient works 2 full time jobs and is unable to come in for an appointment//She called and stated she use to take Lantus  about 2 years ago//She would not be able to come in for an appointment but would like to be prescribed medication again//Send any correspondence via mychart//

## 2024-03-16 ENCOUNTER — Other Ambulatory Visit: Payer: Self-pay | Admitting: Physician Assistant

## 2024-03-16 DIAGNOSIS — E1165 Type 2 diabetes mellitus with hyperglycemia: Secondary | ICD-10-CM

## 2024-03-16 MED ORDER — INSULIN GLARGINE-YFGN 100 UNIT/ML ~~LOC~~ SOPN: 20.0000 [IU] | PEN_INJECTOR | Freq: Every day | SUBCUTANEOUS | 2 refills | Status: AC

## 2024-03-18 ENCOUNTER — Ambulatory Visit (HOSPITAL_COMMUNITY)
Admission: RE | Admit: 2024-03-18 | Discharge: 2024-03-18 | Disposition: A | Discharge status: Home / Self Care | Source: Ambulatory Visit | Attending: Physician Assistant | Admitting: Physician Assistant

## 2024-03-18 ENCOUNTER — Encounter (HOSPITAL_COMMUNITY): Payer: Self-pay

## 2024-03-18 DIAGNOSIS — Z1231 Encounter for screening mammogram for malignant neoplasm of breast: Secondary | ICD-10-CM | POA: Insufficient documentation

## 2024-03-24 ENCOUNTER — Other Ambulatory Visit (HOSPITAL_COMMUNITY): Payer: Self-pay | Admitting: Physician Assistant

## 2024-03-24 ENCOUNTER — Inpatient Hospital Stay
Admission: RE | Admit: 2024-03-24 | Discharge: 2024-03-24 | Disposition: A | Discharge status: Home / Self Care | Payer: Self-pay | Source: Ambulatory Visit | Attending: Physician Assistant | Admitting: Physician Assistant

## 2024-03-24 ENCOUNTER — Inpatient Hospital Stay
Admission: RE | Admit: 2024-03-24 | Discharge: 2024-03-24 | Disposition: A | Discharge status: Home / Self Care | Payer: Self-pay | Source: Ambulatory Visit | Attending: Physician Assistant

## 2024-03-24 DIAGNOSIS — Z1231 Encounter for screening mammogram for malignant neoplasm of breast: Secondary | ICD-10-CM

## 2024-05-17 ENCOUNTER — Ambulatory Visit (INDEPENDENT_AMBULATORY_CARE_PROVIDER_SITE_OTHER): Admitting: Orthopedic Surgery

## 2024-05-17 ENCOUNTER — Encounter: Payer: Self-pay | Admitting: Orthopedic Surgery

## 2024-05-17 DIAGNOSIS — M1712 Unilateral primary osteoarthritis, left knee: Secondary | ICD-10-CM

## 2024-05-17 DIAGNOSIS — M25562 Pain in left knee: Secondary | ICD-10-CM

## 2024-05-17 DIAGNOSIS — G8929 Other chronic pain: Secondary | ICD-10-CM | POA: Diagnosis not present

## 2024-05-17 DIAGNOSIS — I10 Essential (primary) hypertension: Secondary | ICD-10-CM | POA: Insufficient documentation

## 2024-05-17 MED ORDER — NAPROXEN 500 MG PO TABS: 500.0000 mg | ORAL_TABLET | Freq: Two times a day (BID) | ORAL | 2 refills | Status: AC

## 2024-05-17 NOTE — Patient Instructions (Signed)

## 2024-05-17 NOTE — Progress Notes (Signed)
 Return patient Visit  Assessment: Kayla Wilkinson is a 53 y.o. 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  I saw her in clinic several months ago.  At that time, the left knee was injected, and this improved her symptoms until a couple months ago.  Since then, it has progressively worsened.  Pain is primarily deep to the patella, as well as the medial knee.  We reviewed the MRI once again in clinic today which demonstrates some areas of full-thickness cartilage loss within the patellofemoral joint.  She also has some mild to moderate arthritis within the medial compartment.  I believe that this is causing a lot of her discomfort.  This was discussed in clinic.  She states understanding.  She is interested in another injection.  We also discussed the possibility of hyaluronic acid injections in the future.  Procedure note injection Left knee joint   Verbal consent was obtained to inject the left knee joint  Timeout was completed to confirm the site of injection.  The skin was prepped with alcohol and ethyl chloride was sprayed at the injection site.  A 21-gauge needle was used to inject 40 mg of Depo-Medrol  and 1% lidocaine  (4 cc) into the left knee using an anterolateral approach.  There were no complications. A sterile bandage was applied.        Follow-up: Return if symptoms worsen or fail to improve.  Subjective:  Chief Complaint  Patient presents with   Knee Pain    L had injection in Jun '25 and it took a while for the injection to work but mid Aug pain started coming back is gradually getting worse. Pt states feels like someone is jabbing her on the inside of the knee, brace didn't help.     History of Present Illness: Kayla Wilkinson is a 53 y.o. female who returns for evaluation of left knee pain.   She has chronic knee pain.  MRI shows arthritis.  Recent injection helped for a couple of months but the pain has progressively returned.  Pain is medial and deep within the knee.  The brace was not helpful.  Review of Systems: No fevers or chills No numbness or tingling No chest pain No shortness of breath No bowel or bladder dysfunction No GI distress No headaches    Objective: LMP 04/05/2017   Physical Exam:  General: Alert and oriented. and No acute distress. Gait: Left sided antalgic gait.  Left knee with a mild effusion.  Mild swelling.  No bruising.  Knee is stable to varus and valgus stress.  Tenderness palpation over the medial and lateral joint line.  Crepitus with range of motion testing.  Negative Lachman.  IMAGING: I personally reviewed images previously obtained from the ED  Left knee MRI  Impression:  1. Moderate intrasubstance degeneration throughout the posterior horn and posterior segment of the body of the medial meniscus. There is a subtle and likely chronic/degenerative oblique tear extending through the inferior articular surface of the middle third of the meniscal triangle of the medial aspect of the posterior horn of the medial meniscus. There is mild to moderate attenuation of the inferior aspect of the posterior segment of the body of the medial meniscus with multiple chronic/degenerative appearing tears extending through the inferior articular surface of the middle to peripheral thirds of the meniscal triangle. 2. Predominantly full-thickness cartilage  loss within the mid height of the medial patellar facet in a region measuring up to approximately 11 mm in transverse dimension and 11 mm in craniocaudal dimension. Moderate thinning of the superior trochlear notch cartilage. High-grade partial-thickness cartilage loss of the superomedial aspect of the medial trochlear cartilage. 3. Moderate thinning of the weight-bearing medial femoral  condyle cartilage. 4. Small joint effusion. 5. Mild to moderate Baker's cyst.   New Medications:  Meds ordered this encounter  Medications   naproxen  (NAPROSYN ) 500 MG tablet    Sig: Take 1 tablet (500 mg total) by mouth 2 (two) times daily with a meal.    Dispense:  60 tablet    Refill:  2      Oneil DELENA Horde, MD  05/17/2024 1:28 PM

## 2024-07-12 ENCOUNTER — Ambulatory Visit: Admitting: Orthopedic Surgery

## 2024-09-05 ENCOUNTER — Ambulatory Visit: Admitting: Physician Assistant
